# Patient Record
Sex: Male | Born: 1950 | Race: White | Hispanic: No | Marital: Married | Smoking: Never smoker
Health system: Southern US, Community
[De-identification: ages and names within clinical notes are randomized; demographics above are authoritative.]

## PROBLEM LIST (undated history)

## (undated) DIAGNOSIS — I6521 Occlusion and stenosis of right carotid artery: Secondary | ICD-10-CM

## (undated) DIAGNOSIS — I639 Cerebral infarction, unspecified: Secondary | ICD-10-CM

## (undated) DIAGNOSIS — I2510 Atherosclerotic heart disease of native coronary artery without angina pectoris: Secondary | ICD-10-CM

## (undated) DIAGNOSIS — E785 Hyperlipidemia, unspecified: Secondary | ICD-10-CM

## (undated) DIAGNOSIS — I219 Acute myocardial infarction, unspecified: Secondary | ICD-10-CM

## (undated) DIAGNOSIS — N189 Chronic kidney disease, unspecified: Secondary | ICD-10-CM

## (undated) DIAGNOSIS — I6381 Other cerebral infarction due to occlusion or stenosis of small artery: Secondary | ICD-10-CM

## (undated) DIAGNOSIS — G459 Transient cerebral ischemic attack, unspecified: Secondary | ICD-10-CM

## (undated) DIAGNOSIS — I10 Essential (primary) hypertension: Secondary | ICD-10-CM

## (undated) DIAGNOSIS — E119 Type 2 diabetes mellitus without complications: Secondary | ICD-10-CM

## (undated) DIAGNOSIS — K56609 Unspecified intestinal obstruction, unspecified as to partial versus complete obstruction: Secondary | ICD-10-CM

## (undated) HISTORY — DX: Cerebral infarction, unspecified: I63.9

## (undated) HISTORY — DX: Chronic kidney disease, unspecified: N18.9

## (undated) HISTORY — DX: Type 2 diabetes mellitus without complications: E11.9

## (undated) HISTORY — DX: Hyperlipidemia, unspecified: E78.5

## (undated) HISTORY — PX: FINGER SURGERY: SHX640

## (undated) HISTORY — DX: Acute myocardial infarction, unspecified: I21.9

## (undated) HISTORY — PX: OTHER SURGICAL HISTORY: SHX169

---

## 1999-12-10 ENCOUNTER — Emergency Department (HOSPITAL_COMMUNITY): Admission: EM | Admit: 1999-12-10 | Discharge: 1999-12-10 | Payer: Self-pay | Admitting: *Deleted

## 2001-12-14 ENCOUNTER — Encounter: Payer: Self-pay | Admitting: Emergency Medicine

## 2001-12-14 ENCOUNTER — Emergency Department (HOSPITAL_COMMUNITY): Admission: EM | Admit: 2001-12-14 | Discharge: 2001-12-14 | Payer: Self-pay | Admitting: Emergency Medicine

## 2002-02-08 ENCOUNTER — Emergency Department (HOSPITAL_COMMUNITY): Admission: EM | Admit: 2002-02-08 | Discharge: 2002-02-08 | Payer: Self-pay | Admitting: Emergency Medicine

## 2002-08-02 ENCOUNTER — Emergency Department (HOSPITAL_COMMUNITY): Admission: EM | Admit: 2002-08-02 | Discharge: 2002-08-02 | Payer: Self-pay | Admitting: Emergency Medicine

## 2004-06-09 ENCOUNTER — Inpatient Hospital Stay (HOSPITAL_COMMUNITY): Admission: EM | Admit: 2004-06-09 | Discharge: 2004-06-10 | Payer: Self-pay | Admitting: Emergency Medicine

## 2004-11-17 ENCOUNTER — Emergency Department (HOSPITAL_COMMUNITY): Admission: EM | Admit: 2004-11-17 | Discharge: 2004-11-18 | Payer: Self-pay

## 2005-11-22 ENCOUNTER — Encounter: Payer: Self-pay | Admitting: Emergency Medicine

## 2005-11-23 ENCOUNTER — Encounter (INDEPENDENT_AMBULATORY_CARE_PROVIDER_SITE_OTHER): Payer: Self-pay | Admitting: Cardiology

## 2005-11-23 ENCOUNTER — Inpatient Hospital Stay (HOSPITAL_COMMUNITY): Admission: EM | Admit: 2005-11-23 | Discharge: 2005-11-24 | Payer: Self-pay | Admitting: *Deleted

## 2005-11-23 DIAGNOSIS — I639 Cerebral infarction, unspecified: Secondary | ICD-10-CM

## 2005-11-23 HISTORY — DX: Cerebral infarction, unspecified: I63.9

## 2006-02-01 ENCOUNTER — Ambulatory Visit: Payer: Self-pay | Admitting: Family Medicine

## 2006-02-07 ENCOUNTER — Ambulatory Visit: Payer: Self-pay | Admitting: Family Medicine

## 2006-02-07 ENCOUNTER — Ambulatory Visit: Payer: Self-pay | Admitting: *Deleted

## 2006-02-14 ENCOUNTER — Ambulatory Visit: Payer: Self-pay | Admitting: Family Medicine

## 2006-03-05 ENCOUNTER — Ambulatory Visit: Payer: Self-pay | Admitting: Family Medicine

## 2007-09-04 ENCOUNTER — Emergency Department (HOSPITAL_COMMUNITY): Admission: EM | Admit: 2007-09-04 | Discharge: 2007-09-04 | Payer: Self-pay | Admitting: Emergency Medicine

## 2007-10-24 HISTORY — PX: LAPAROSCOPIC CHOLECYSTECTOMY: SUR755

## 2008-03-24 ENCOUNTER — Ambulatory Visit: Payer: Self-pay | Admitting: Vascular Surgery

## 2008-03-24 ENCOUNTER — Encounter (INDEPENDENT_AMBULATORY_CARE_PROVIDER_SITE_OTHER): Payer: Self-pay | Admitting: Internal Medicine

## 2008-03-24 ENCOUNTER — Inpatient Hospital Stay (HOSPITAL_COMMUNITY): Admission: EM | Admit: 2008-03-24 | Discharge: 2008-03-27 | Payer: Self-pay | Admitting: Emergency Medicine

## 2008-03-24 ENCOUNTER — Encounter (INDEPENDENT_AMBULATORY_CARE_PROVIDER_SITE_OTHER): Payer: Self-pay | Admitting: General Surgery

## 2008-10-23 DIAGNOSIS — I639 Cerebral infarction, unspecified: Secondary | ICD-10-CM

## 2008-10-23 HISTORY — DX: Cerebral infarction, unspecified: I63.9

## 2009-06-23 DIAGNOSIS — G459 Transient cerebral ischemic attack, unspecified: Secondary | ICD-10-CM

## 2009-06-23 HISTORY — DX: Transient cerebral ischemic attack, unspecified: G45.9

## 2009-07-16 ENCOUNTER — Ambulatory Visit: Payer: Self-pay | Admitting: Internal Medicine

## 2009-07-16 ENCOUNTER — Inpatient Hospital Stay (HOSPITAL_COMMUNITY): Admission: EM | Admit: 2009-07-16 | Discharge: 2009-07-17 | Payer: Self-pay | Admitting: Emergency Medicine

## 2009-07-16 ENCOUNTER — Encounter (INDEPENDENT_AMBULATORY_CARE_PROVIDER_SITE_OTHER): Payer: Self-pay | Admitting: Neurology

## 2010-01-12 ENCOUNTER — Emergency Department (HOSPITAL_COMMUNITY): Admission: EM | Admit: 2010-01-12 | Discharge: 2010-01-12 | Payer: Self-pay

## 2010-05-02 ENCOUNTER — Emergency Department (HOSPITAL_COMMUNITY): Admission: EM | Admit: 2010-05-02 | Discharge: 2010-05-02 | Payer: Self-pay | Admitting: Emergency Medicine

## 2010-09-22 DIAGNOSIS — K56609 Unspecified intestinal obstruction, unspecified as to partial versus complete obstruction: Secondary | ICD-10-CM

## 2010-09-22 HISTORY — DX: Unspecified intestinal obstruction, unspecified as to partial versus complete obstruction: K56.609

## 2010-09-23 ENCOUNTER — Inpatient Hospital Stay (HOSPITAL_COMMUNITY): Admission: EM | Admit: 2010-09-23 | Discharge: 2010-09-25 | Payer: Self-pay | Admitting: Emergency Medicine

## 2011-01-03 LAB — URINALYSIS, ROUTINE W REFLEX MICROSCOPIC
Bilirubin Urine: NEGATIVE
Ketones, ur: NEGATIVE mg/dL
Nitrite: POSITIVE — AB
Protein, ur: NEGATIVE mg/dL
Urobilinogen, UA: 1 mg/dL (ref 0.0–1.0)

## 2011-01-03 LAB — URINE MICROSCOPIC-ADD ON

## 2011-01-03 LAB — CBC
HCT: 47.4 % (ref 39.0–52.0)
MCHC: 35.9 g/dL (ref 30.0–36.0)
MCV: 90.6 fL (ref 78.0–100.0)
Platelets: 302 10*3/uL (ref 150–400)
RDW: 13 % (ref 11.5–15.5)

## 2011-01-03 LAB — URINE CULTURE
Colony Count: NO GROWTH
Culture  Setup Time: 201112010128
Culture: NO GROWTH

## 2011-01-03 LAB — DIFFERENTIAL
Basophils Absolute: 0 10*3/uL (ref 0.0–0.1)
Basophils Relative: 0 % (ref 0–1)
Eosinophils Absolute: 0.2 10*3/uL (ref 0.0–0.7)
Eosinophils Relative: 2 % (ref 0–5)
Monocytes Absolute: 1.7 10*3/uL — ABNORMAL HIGH (ref 0.1–1.0)

## 2011-01-03 LAB — BASIC METABOLIC PANEL
BUN: 13 mg/dL (ref 6–23)
CO2: 30 mEq/L (ref 19–32)
Calcium: 8.7 mg/dL (ref 8.4–10.5)
Chloride: 103 mEq/L (ref 96–112)
Creatinine, Ser: 0.68 mg/dL (ref 0.4–1.5)
Glucose, Bld: 115 mg/dL — ABNORMAL HIGH (ref 70–99)
Glucose, Bld: 133 mg/dL — ABNORMAL HIGH (ref 70–99)
Potassium: 4.1 mEq/L (ref 3.5–5.1)
Sodium: 140 mEq/L (ref 135–145)

## 2011-01-03 LAB — HEMOGLOBIN A1C: Hgb A1c MFr Bld: 6.2 % — ABNORMAL HIGH (ref <5.7)

## 2011-01-08 LAB — BASIC METABOLIC PANEL
BUN: 8 mg/dL (ref 6–23)
Chloride: 103 mEq/L (ref 96–112)
GFR calc non Af Amer: >60 mL/min (ref >60)
Glucose, Bld: 133 mg/dL — ABNORMAL HIGH (ref 70–99)
Potassium: 3.4 mEq/L — ABNORMAL LOW (ref 3.5–5.1)

## 2011-01-08 LAB — BASIC METABOLIC PANEL WITH GFR
CO2: 27 meq/L (ref 19–32)
Calcium: 8.7 mg/dL (ref 8.4–10.5)
Creatinine, Ser: 0.74 mg/dL (ref 0.4–1.5)
GFR calc Af Amer: >60 mL/min (ref >60)
Sodium: 137 meq/L (ref 135–145)

## 2011-01-08 LAB — DIFFERENTIAL
Basophils Absolute: 0 10*3/uL (ref 0.0–0.1)
Basophils Relative: 0 % (ref 0–1)
Eosinophils Absolute: 0 10*3/uL (ref 0.0–0.7)
Eosinophils Relative: 0 % (ref 0–5)
Lymphocytes Relative: 14 % (ref 12–46)
Lymphs Abs: 1.3 K/uL (ref 0.7–4.0)
Monocytes Absolute: 1.2 10*3/uL — ABNORMAL HIGH (ref 0.1–1.0)
Monocytes Relative: 13 % — ABNORMAL HIGH (ref 3–12)
Neutro Abs: 6.9 K/uL (ref 1.7–7.7)
Neutrophils Relative %: 72 % (ref 43–77)

## 2011-01-08 LAB — CBC
HCT: 46.1 % (ref 39.0–52.0)
Hemoglobin: 15.8 g/dL (ref 13.0–17.0)
MCH: 32.2 pg (ref 26.0–34.0)
MCHC: 34.3 g/dL (ref 30.0–36.0)
MCV: 94 fL (ref 78.0–100.0)
Platelets: 257 K/uL (ref 150–400)
RBC: 4.91 MIL/uL (ref 4.22–5.81)
RDW: 12.7 % (ref 11.5–15.5)
WBC: 9.5 K/uL (ref 4.0–10.5)

## 2011-01-16 LAB — CBC
Hemoglobin: 15.1 g/dL (ref 13.0–17.0)
MCHC: 35 g/dL (ref 30.0–36.0)
MCV: 94.3 fL (ref 78.0–100.0)
RBC: 4.57 MIL/uL (ref 4.22–5.81)

## 2011-01-16 LAB — POCT I-STAT, CHEM 8
BUN: 8 mg/dL (ref 6–23)
Chloride: 105 meq/L (ref 96–112)
HCT: 44 % (ref 39.0–52.0)
Potassium: 3.5 meq/L (ref 3.5–5.1)
Sodium: 140 meq/L (ref 135–145)

## 2011-01-16 LAB — DIFFERENTIAL
Basophils Relative: 0 % (ref 0–1)
Eosinophils Absolute: 0.1 10*3/uL (ref 0.0–0.7)
Monocytes Absolute: 0.7 10*3/uL (ref 0.1–1.0)
Monocytes Relative: 12 % (ref 3–12)

## 2011-01-16 LAB — LACTIC ACID, PLASMA: Lactic Acid, Venous: 1.8 mmol/L (ref 0.5–2.2)

## 2011-01-27 LAB — COMPREHENSIVE METABOLIC PANEL
AST: 33 U/L (ref 0–37)
Albumin: 4 g/dL (ref 3.5–5.2)
Alkaline Phosphatase: 92 U/L (ref 39–117)
BUN: 9 mg/dL (ref 6–23)
Chloride: 105 mEq/L (ref 96–112)
Creatinine, Ser: 0.63 mg/dL (ref 0.4–1.5)
GFR calc Af Amer: >60 mL/min (ref >60)
Potassium: 3.6 mEq/L (ref 3.5–5.1)
Total Bilirubin: 1 mg/dL (ref 0.3–1.2)
Total Protein: 7.5 g/dL (ref 6.0–8.3)

## 2011-01-27 LAB — URINALYSIS, ROUTINE W REFLEX MICROSCOPIC
Ketones, ur: NEGATIVE mg/dL
Nitrite: NEGATIVE
Protein, ur: NEGATIVE mg/dL
Urobilinogen, UA: 4 mg/dL — ABNORMAL HIGH (ref 0.0–1.0)

## 2011-01-27 LAB — DRUGS OF ABUSE SCREEN W/O ALC, ROUTINE URINE
Amphetamine Screen, Ur: NEGATIVE
Barbiturate Quant, Ur: NEGATIVE
Benzodiazepines.: NEGATIVE
Methadone: NEGATIVE
Phencyclidine (PCP): NEGATIVE

## 2011-01-27 LAB — CBC
HCT: 41.7 % (ref 39.0–52.0)
Hemoglobin: 14.9 g/dL (ref 13.0–17.0)
RDW: 13 % (ref 11.5–15.5)
WBC: 5.6 10*3/uL (ref 4.0–10.5)

## 2011-01-27 LAB — GLUCOSE, CAPILLARY
Glucose-Capillary: 120 mg/dL — ABNORMAL HIGH (ref 70–99)
Glucose-Capillary: 137 mg/dL — ABNORMAL HIGH (ref 70–99)
Glucose-Capillary: 147 mg/dL — ABNORMAL HIGH (ref 70–99)

## 2011-01-27 LAB — DIFFERENTIAL
Eosinophils Relative: 1 % (ref 0–5)
Lymphocytes Relative: 28 % (ref 12–46)
Monocytes Absolute: 0.7 10*3/uL (ref 0.1–1.0)
Monocytes Relative: 12 % (ref 3–12)
Neutro Abs: 3.3 10*3/uL (ref 1.7–7.7)

## 2011-01-27 LAB — LIPID PANEL
HDL: 18 mg/dL — ABNORMAL LOW (ref >39)
Total CHOL/HDL Ratio: 7.2 RATIO
Triglycerides: 176 mg/dL — ABNORMAL HIGH (ref <150)

## 2011-01-27 LAB — TROPONIN I: Troponin I: 0.03 ng/mL (ref 0.00–0.06)

## 2011-01-27 LAB — HEMOGLOBIN A1C: Hgb A1c MFr Bld: 5.6 % (ref 4.6–6.1)

## 2011-01-27 LAB — APTT: aPTT: 28 seconds (ref 24–37)

## 2011-01-27 LAB — CK TOTAL AND CKMB (NOT AT ARMC)
CK, MB: 1.1 ng/mL (ref 0.3–4.0)
Total CK: 53 U/L (ref 7–232)

## 2011-01-27 LAB — URINE MICROSCOPIC-ADD ON

## 2011-03-07 NOTE — Consult Note (Signed)
NAMEVICTORIA, EUCEDA                  ACCOUNT NO.:  [id number]   MEDICAL RECORD NO.:  [id number]          PATIENT TYPE:  INP   LOCATION:  5120                         FACILITY:  [facility]   PHYSICIAN:  Isidor Holts, M.D.  DATE OF BIRTH:  1951/07/13   DATE OF CONSULTATION:  03/24/2008  DATE OF DISCHARGE:                                 CONSULTATION   REFERRING PHYSICIAN:  Dr. Derrell Lolling.   REASON FOR CONSULTATION:  Preoperative evaluation.   HISTORY OF PRESENT ILLNESS:  This is a 60 year old male, admitted March 24, 2008, with acute calculous cholecystitis.  Surgery is planned for  today, and we are requested to provide preoperative evaluation and  clearance.   PAST MEDICAL HISTORY:  1. 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  2. PVD.  Right 40%-50% ICA stenosis.  3. Previous history of transient hypertension.  4. Coronary artery disease, status post MI approximately 15 years ago.   MEDICATIONS PREADMISSION:  Aspirin 81 mg p.o. daily.   ALLERGIES:  PENICILLIN.   REVIEW OF SYSTEMS:  Right upper quadrant pain, fever, denies vomiting or  diarrhea.  Denies chest pain or shortness of breath.  Effort tolerance  is of the order of 6 miles on the flat, according to patient.   SOCIAL HISTORY:  Retired.  Works as a Pensions consultant.  Nonsmoker,  nondrinker.  Has no history of drug abuse.   FAMILY HISTORY:  Noncontributory.   PHYSICAL EXAMINATION:  VITALS:  Temperature 97.7, pulse 59 per minute  regular, respiratory rate 16, BP 97/58 mmHg, pulse oximeter 97% on room  air.  GENERAL:  The patient did not appear to be in obvious acute distress at  time of this evaluation, alert, communicative, not short of breath at  rest.  HEENT:  No clinical pallor or jaundice.  No conjunctival injection.  Throat is clear.  NECK:  Supple.  JVP not seen.  No palpable lymphadenopathy.  No palpable  goiter.  No carotid bruits.  CHEST:  Clinically clear to  auscultation.  No wheezes, no crackles.  HEART:  Heart sounds 1 and 2 are heard, normal, regular, no murmurs.  ABDOMEN:  Full, soft.  The patient has right upper quadrant positive  Murphy's sign.  Bowel sounds are heard.  EXTREMITIES:  Lower extremity examination:  No pitting edema.  Palpable  peripheral pulses.  MUSCULOSKELETAL:  Unremarkable.  CENTRAL NERVOUS SYSTEM:  No focal neurologic deficit on gross  examination.   LABORATORY INVESTIGATIONS:  Investigations March 23, 2008.  CBC:  WBC  13.7, hemoglobin 15.3, hematocrit 45, platelets 237.  Electrolytes:  Sodium 139, potassium 3.7, chloride 103, CO2 24, BUN 9, creatinine 0.7,  glucose 102.  Troponin I point of care 0.05.  LFT's normal.  Urinalysis  negative.  INR March 24, 2008 1.2.   Head CT scan March 23, 2008 shows old right basal ganglia CVA, no acute  intracranial findings.  Abdominal CT scan/pelvic CT scan March 24, 2008  shows cholelithiasis.  Ultrasound scan abdomen March 23, 2000 shows large  gallstone in bladder neck  and positive radiographic Murphy's.  Chest x-  ray on March 24, 2008 shows mild cardiac enlargement; otherwise, no acute  findings.  A 12-lead EKG dated March 23, 2008 shows sinus rhythm regular  110 per minute, normal axis, pressure right bundle branch block pattern,  no acute ischemic changes.   ASSESSMENT AND PLAN/RECOMMENDATIONS:  1. Acute calculous cholecystitis.  We shall defer management to the      surgical team.  Surgery is contemplated for March 23, 2008.   1. History of coronary artery disease.  The patient is completely      asymptomatic and has effort tolerance of the order of approximately      six miles on the flat.  Denies shortness of breath or chest pain,      and is currently only on Aspirin.  12-lead EKG is unremarkable.  We      recommend perioperative low dose beta blockade.   1. Peripheral vascular disease/history of right carotid artery      stenosis.  The patient's peripheral pulses are  palpable.  Review of      electronic medical records, indicates a previous finding of right      40-50% carotid artery stenosis in 2007.  I doubt this has      progressed dramatically since then.  However, for completeness, we      shall recommend doing bilateral carotid/vertebral artery duplex      scan to rule out possible high grade stenosis.   COMMENT:  The patient has very remote history of coronary artery  disease, status post MI approximately 15 years ago.  He has had no  symptoms since.  He is also an arteriopath, but he has no valvular heart  disease.  At this point, based on multifactorial indices, we will  consider him at low to moderate risk for general anesthesia and surgical  procedure.  As mentioned above, we recommend carotid/vertebral artery  duplex to rule out high grade stenosis prior to surgery and also  perioperative low dose beta blocker.   Thanks for the consultation.  We will follow with you.      Isidor Holts, M.D.  Electronically Signed     CO/MEDQ  D:  03/24/2008  T:  03/24/2008  Job:  [id number]   cc:   Angelia Mould. Derrell Lolling, M.D.

## 2011-03-07 NOTE — Op Note (Signed)
[name], [name] NO.:  [id number]   MEDICAL RECORD NO.:  [id number]           PATIENT TYPE:   LOCATION:                                 FACILITY:   PHYSICIAN:  [name], M.D.DATE OF BIRTH:  1951-05-07   DATE OF PROCEDURE:  03/24/2008  DATE OF DISCHARGE:                               OPERATIVE REPORT   PREOPERATIVE DIAGNOSIS:  Acute cholecystitis with cholelithiasis.   POSTOPERATIVE DIAGNOSIS:  Acute cholecystitis with cholelithiasis.   OPERATION PERFORMED:  Laparoscopic cholecystectomy with intraoperative  cholangiogram.   SURGEON:  Duane Mould. Derrell Lolling, MD   FIRST ASSISTANT:  [name], [place]   OPERATIVE INDICATIONS:  This is a 60 year old man who noted the onset of  sharp right upper quadrant and right flank pain at 5:00 p.m. on March 23, 2008, and the pain persisted.  He came to the emergency room.  He has a  history of a stroke and CT scan of brain showed an old infarct, but no  acute changes.  CT scan of the abdomen and pelvis showed gallstones.  Ultrasound showed gallstone in the neck of the gallbladder and slight  wall thickening.  The patient had fever to 102.5 and a white blood cell  count of 13,700, but minimally abnormal liver function test.  He was  admitted by Dr. Avel [name].  He was placed on IV antibiotics.  This  morning, I saw the patient.  We had Dr. Brien [name] see him for medical  clearance.  Carotid Doppler was done, which showed no evidence of any  significant carotid stenosis and so we decided to bring the patient to  the operating room today.   OPERATIVE FINDINGS:  The gallbladder was edematous and acutely inflamed.  The cystic duct was extremely tiny, but was patent.  The cholangiogram  was normal showing normal intrahepatic and extrahepatic bile ducts, no  filling defects, no stones, and no obstruction with good flow of  contrast to the duodenum.  The liver, stomach, duodenum, small  intestine, large intestine, and  peritoneal surfaces were otherwise  normal to inspection.   OPERATIVE TECHNIQUE:  Following the induction of general endotracheal  anesthesia, the patient's abdomen was prepped and draped in a sterile  fashion.  Intravenous antibiotics had previously been given.  The  patient was identified as correct patient and correct procedure.  A 0.5%  Marcaine with epinephrine was used as local infiltration anesthetic.  A  curved transverse incision was made at the superior rim of the  umbilicus.  The fascia was incised in the midline and the abdominal  cavity was entered under direct vision.  A 12-mm Hasson trocar was  inserted and secured with purse-string suture of 0 Vicryl.  Pneumoperitoneum was created.  Video camera was inserted with  visualization and findings as described above.  A 12-mm trocar was  placed in the subxiphoid region and two 5 mm-trocars were placed in the  right midabdomen.  The gallbladder fundus was identified and grasped and  elevated.  We took some adhesions down from the lower body and  infundibulum of the gallbladder and then we were able to retract the  infundibulum laterally.  I dissected out the cystic duct and the cystic  artery.  This cystic artery was secured with multiple metal clips and  divided creating a large window behind the cystic duct.  A cholangiogram  catheter was inserted into the cystic duct.  A cholangiogram was  obtained using the C-arm.  The cholangiogram was normal as described  above.  The cystic duct tore in half during all of this process.  Under  direct vision, we took the cholangiogram catheter out, but the cystic  duct was very tiny and slipped away.  With retraction, we were able to  identify and grasp it and put 3 metal clips on it and there was no  evidence of any bile leak that I could see.  The gallbladder was  dissected from its bed with electrocautery, placed in a specimen bag and  removed.  The operative field was copiously irrigated  with saline.  At  the completion of the case, the irrigation fluid looked fairly clear.  There was no obvious bleeding or bile leak.  Because of the acute  inflammation and somewhat inflamed and necrotic appearance of the cystic  duct, I chose to put a 19-French Blake drain in the subphrenic space.  This was brought out through one of the lateral 5-mm port sites.  I  sutured the skin with nylon suture and connected to a suction bulb.  We  looked around and saw no other abnormality.  All the pneumoperitoneum  was released and all the trocars were removed.  The fascia of the  umbilicus was closed with interrupted sutures of 0 Vicryl.  The skin  incisions were closed with subcuticular sutures of 4-0 Monocryl and  Dermabond.  Clean bandages were placed and the patient was taken to the  recovery room in stable condition.   ESTIMATED BLOOD LOSS:  About 20-25 mL.   COMPLICATIONS:  None.   SPONGE, NEEDLE, AND COUNTS:  Correct.      [name], M.D.  Electronically Signed     HMI/MEDQ  D:  03/24/2008  T:  03/25/2008  Job:  [id number]   cc:   [name], M.D.

## 2011-03-07 NOTE — Discharge Summary (Signed)
[name], [name] NO.:  [id number]   MEDICAL RECORD NO.:  [id number]          PATIENT TYPE:  INP   LOCATION:  5159                         FACILITY:  [facility]   PHYSICIAN:  Angelia Mould. Derrell Lolling, M.D.DATE OF BIRTH:  May 01, 1951   DATE OF ADMISSION:  03/23/2008  DATE OF DISCHARGE:  03/27/2008                               DISCHARGE SUMMARY   ADMITTING PHYSICIAN:  Adolph Pollack, MD   DISCHARGING PHYSICIAN:  Angelia Mould. Derrell Lolling, MD   OPERATING SURGEON:  Angelia Mould. Derrell Lolling, MD   PROCEDURE:  A laparoscopic cholecystectomy with intraoperative  cholangiogram on March 24, 2008.   CONSULTATIONS:  Isidor Holts, MD   REASON FOR HOSPITALIZATION OR REASON FOR ADMISSION:  [name] is a 60-  year-old male who had an abrupt onset of sharp right flank pain that was  persistent.  He then presented to the emergency department, where a CT  scan was performed looking for a kidney stone.  CT scan did not show  kidney stone, but did show some gallstones.  At this time, he was noted  to have leukocytosis and a mild elevation in his total bilirubin.  An  abdominal ultrasound was performed at this time, which demonstrated 2-cm  gallstone with gallbladder wall thickening consistent with acute  cholecystitis.  He denied any fever, vomiting, diarrhea, or  constipation.  Please see admitting history and physical for further  details.   ADMITTING DIAGNOSES:  1. Acute cholecystitis.  2. History of cerebrovascular accident.  3. Transient hypertension.  4. History of myocardial infarction 15 years ago.  5. Right carotid artery disease.   HOSPITAL COURSE:  The patient was admitted to [facility].  At this time, based on the patient's history, CVA as well as  MI, we had the Internal Medicine doctors consulted for preoperative  evaluation.  At this time, Dr. Brien Few did examine the patient and after his  examination recommended that the patient have bilateral carotid  artery  duplex scan to rule out high-grade stenosis.  He also recommended at  this time that the patient be put on a preoperative low-dose beta-  blocker.  Lateral that day, the patient did have a carotid duplex scan,  which did not show any significant stenosis.  Therefore, at this time,  the patient was taken to the operating room, where a laparoscopic  cholecystectomy with intraoperative cholangiogram was performed.  During  the surgery, the patient had some bleeding, and therefore a 19-French  Blake drain was put in place.  On postoperative day one half, which was  later on March 24, 2008, the patient was complaining of bilateral arm  heaviness.  The patient states at this time he feels similar to his  prior CVA symptoms.  At this time, the patient is not having any chest  pain.   On exam, the patient was alert and oriented x3.  His speech was clear  and had essentially normal neurovascular exam except for some residual  left-sided weakness from his prior CVA.  At this time, the patient was  transferred to  a step-down unit for closer monitoring.  On postoperative  day #1, the patient was continuing to improve and felt better.  At this  time, he was denying any weakness or heaviness.  His abdomen was soft  and tender in the right upper quadrant.  Of note, he had some  serosanguineous drainage out of his JP bulb.  At this time, the patient  seemed stable, and he was transferred back to a regular floor bed.   On postoperative day #2, the patient was once again complaining of some  heaviness this time in his legs as well as his arm.  However, at this  time, he stated that he had just received a dose of Phenergan, which was  making him somewhat lethargic as well as weak.  At this time, Dr.  Toniann Fail was called.  I discussed this case with him and at this time,  we felt the need to get a stat MRI of the head as well as some stat  labs.  On exam, his cranial nerves II through XII were  grossly intact.  He had poor strength bilaterally in his lower extremities as well as his  upper extremities.  Otherwise, his exam was difficult to perform due to  lethargy.  At this time, due to the possibility that this was medication  induced with discontinued his Phenergan and changed it to Zofran.  Later  that day, his MRI came back and showed no abnormalities.  His CBC as  well as his CMET, amylase, and lipase all came back within normal limits  as well.  By postoperative day #3, the patient was feeling much better  and states that he had not had any more episodes of heaviness or  weakness.  At this time, he was also tolerating a regular diet with no  nausea and vomiting.  On exam, his abdomen was soft and he was still  somewhat tender in the right upper quadrant.  His JP drain has  serosanguineous output and it was discontinued at this time prior to  discharge.  Otherwise, the patient was continuing to do well and was  felt ready for discharge.   DISCHARGE DIAGNOSES:  1. Acute cholecystitis with cholelithiasis.  2. Status post laparoscopic cholecystectomy.  3. History of cerebrovascular accident.  4. 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  5. Transient hypertension.  6. History of myocardial infarction.  7. History of right carotid artery disease.   DISCHARGE MEDICATIONS:  The patient is told that he may continue his  aspirin 81 mg daily.  He is also given prescription for metoprolol 12.5  mg 1 tablet b.i.d.  He is also given a prescription for Vicodin 5/325 mg  1-2 p.o. q.4 h. p.r.n. pain.   DISCHARGE INSTRUCTIONS:  The patient was informed that he may return to  work in 1-2 weeks.  He had no dietary restrictions.  He was told that he  may increase his activity slowly and he may walk up steps.  He was also  informed that he may shower.  However, he is not to lift anything  greater than 15 pounds for the next 2 weeks.  He is also informed that  if he begins to get a fever  greater than 101.5, new or severe abdominal  pain, or redness or pus-  like drainage from his incision, to please call our office.  Otherwise,  he is informed that he needs to follow up in our clinic on April 14, 2008  at 2 p.m.  He is also informed that if he is not able to keep this  appointment, to call and cancel it, and reschedule his appointment with  Dr. Derrell Lolling.      Letha [place], [place]      Angelia Mould. Derrell Lolling, M.D.  Electronically Signed    KEO/MEDQ  D:  03/27/2008  T:  03/27/2008  Job:  [id number]   cc:   Eduard Clos, MD  Isidor Holts, M.D.

## 2011-03-07 NOTE — H&P (Signed)
[name], [name] NO.:  [id number]   MEDICAL RECORD NO.:  [id number]          PATIENT TYPE:  INP   LOCATION:  5120                         FACILITY:  [facility]   PHYSICIAN:  Adolph Pollack, M.D.DATE OF BIRTH:  1950/12/24   DATE OF ADMISSION:  03/23/2008  DATE OF DISCHARGE:                              HISTORY & PHYSICAL   CHIEF COMPLAINT:  Right flank pain.   HISTORY OF PRESENT ILLNESS:  This is a 60 year old male in his normal  state of health when at 5:00 p.m. yesterday had abrupt onset of sharp  right flank pain that persisted would not let up.  He subsequently  presented to the emergency room and a CT scan was performed looking for  a kidney stone.  Urinalysis did not show any blood.  CT scan did not  show a kidney stone, but did show some gallstones.  He was known to have  a leukocytosis and mild elevation of total bilirubin.  An abdominal  ultrasound was performed, which demonstrated a 2-cm gallstone with  gallbladder wall thickening consistent with acute cholecystitis.  He has  not had any fever, vomiting, diarrhea, or constipation.  I subsequently  was asked to see him.   PAST MEDICAL HISTORY:  1. Cerebrovascular accident.  2. Transient hypertension.  3. Myocardial infarction 15 years ago per patient.  4. Right carotid artery disease.   PREVIOUS OPERATIONS:  None.   ALLERGIES:  PENICILLIN.   MEDICATIONS:  Aspirin one a day.   SOCIAL HISTORY:  He is married and works 3 hours a day.  No tobacco or  alcohol use.  He has no primary care physician.   FAMILY HISTORY:  Notable for mother who died of lung cancer.   REVIEW OF SYSTEMS:  CARDIOVASCULAR:  He says he has no chest pain and  his high blood pressure is no longer an issue.  PULMONARY:  Denies  pneumonia or asthma.  GI:  No peptic ulcer disease.  No hepatitis or  colitis.  GU:  No kidney stones.  ENDOCRINE:  No diabetes or  hypercholesterolemia.  NEUROLOGIC:  Still has a little bit of  residual  left lower extremity weakness after the stroke.  Does have occasional  headaches.  These are treated with Tylenol.  HEMATOLOGIC:  He states  sometimes if he cuts himself, he bleeds he thinks quite a bit.  No blood  clots.   PHYSICAL EXAM:  GENERAL:  Well-developed, well-nourished male appears to  be in no acute distress, pleasant, cooperative.  VITAL SIGNS:  Maximum temperature in the emergency department 102.5, his  last temperature was 99.3, blood pressure is 107/60, pulse 88, and  respiratory rate 18.  EYES:  Extraocular motions intact.  No icterus.  NECK:  Supple without masses.  RESPIRATORY:  Breath sounds equal and clear.  Respirations unlabored.  CARDIOVASCULAR:  Regular rate, regular rhythm.  I do not hear a murmur.  ABDOMEN:  Soft, but there is right upper quadrant tenderness and  guarding with a positive Murphy sign.  No obvious mass.  No other  abdominal tenderness.  MUSCULOSKELETAL:  Has got good muscle tone, range of motion.  SKIN:  No jaundice.   LABORATORY DATA:  White count is 13,700, hemoglobin 15.1.  Total  bilirubin is elevated at 1.4, but no elevation of other liver function  tests.  Urinalysis normal.  Electrolytes within normal limits except for  glucose of 102.   CT scan and ultrasound reviewed.   IMPRESSION:  Acute cholecystitis.   PLAN:  Admit to the hospital, start IV antibiotics and IV fluids.  Cholecystectomy.  I went over the procedure and risks of laparoscopic  possible open cholecystectomy.  Risks include, but are not limited to  bleeding, infection, wound healing problems, anesthesia, bile leak,  diarrhea, accidental injury to common bile duct/liver/intestine.  He  seems to understand all of these and is agreeable with the plan.      Adolph Pollack, M.D.  Electronically Signed     TJR/MEDQ  D:  03/24/2008  T:  03/24/2008  Job:  [id number]

## 2011-03-10 NOTE — H&P (Signed)
NAME:  TONI, HOFFMEISTER                            ACCOUNT NO.:  [id number]   MEDICAL RECORD NO.:  [id number]                   PATIENT TYPE:  EMS   LOCATION:  ED                                   FACILITY:  [facility]   PHYSICIAN:  Currie Paris, M.D.           DATE OF BIRTH:  [birth year]   DATE OF ADMISSION:  06/09/2004  DATE OF DISCHARGE:                                HISTORY & PHYSICAL   CHIEF COMPLAINT:  Feel, hurt ribs.   HISTORY OF PRESENT ILLNESS:  [name] was working on a ladder and fell.  He said he was on a 16 foot ladder, fell he thinks about 14 feet.  He landed  on his right side, and complains of right clavicle, right shoulder, right  arm, and right rib pain.  The arm pain is in the lower part of the upper  arm, just above the elbow.  These are his only injuries.  He denies other  extremity injury, abdominal problems or injury, or lower extremity injury.  He has had no problems with breathing.  It does hurt to take a deep breath,  but he has not been short of breath, etc.   PAST MEDICAL HISTORY:   OPERATIONS:  None.   MEDICATIONS:  He takes [place] powders frequently for headaches.   ALLERGIES:  PENICILLIN causes him to swell.   SOCIAL HISTORY:  He does not smoke.  He does not drink alcohol.  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   FAMILY HISTORY:  Unremarkable for any significant illnesses.   REVIEW OF SYSTEMS:  HEENT is negative.  CHEST:  No history prior to current  illness of dyspnea on exertion, chest pain, etc.  HEART:  No history of  hypertension, murmurs, or cardiac disease.  ABDOMEN:  Negative.  No history  of abdominal complaints, nausea, vomiting, changes in bowel habits, etc.  GU:  No problems with voiding.  No history of urinary tract or prostate  problems.  EXTREMITIES:  Other than the current illness, negative.   PHYSICAL EXAMINATION:  GENERAL:  The patient is alert, reasonably  comfortable, having already received pain  medication.  He is oriented x3.  VITAL SIGNS:  Blood pressure of 112/79, pulse of about 80, respirations 16,  temperature 96.8.  He weighs 165.  HEENT:  Head is normocephalic.  Eyes are nonicteric.  Pupils are equal,  round, and regular.  EOM's are intact.  Pharynx is normal.  Dentition okay.  NECK:  Supple.  There are no masses, and no thyromegaly noted.  LUNGS:  He has shallow respirations, but no particularly rapid.  He has some  decreased breath sounds on the right side, and a little apparent splinting.  The left side is fairly clear.  CHEST:  He is tender along the left rib cage and the left clavicle, and  there does appear to be some  deformity of the left clavicle.  HEART:  Regular rhythm.  I hear no murmurs, rubs, or gallops.  He has intact  pulses, carotid, femoral, dorsalis pedis.  ABDOMEN:  Soft, flat, nontender.  There are no masses, no organomegaly, and  no hernias are noted.  GU:  Unremarkable.  EXTREMITIES:  No cyanosis or edema.  Good range of motion, except for  problems with his right shoulder secondary to his trauma.   LABORATORY DATA:  None currently available.   X-RAYS:  A CT and plain films show fracture of his right clavicle and  multiple right rib fractures.  No pneumothorax.  No evidence of intra-  abdominal injuries.   IMPRESSION:  Traumatic trauma from fall with right clavicle and multiple rib  fractures.   PLAN:  Will admit him for pain control, keep him on some oxygen, PAS hose,  regular diet, and recheck x-rays in the morning.  He does have labs pending,  which could mean we need to make some adjustments once those are available.  We will attempt to transfer him when a bed is available to the trauma  service at [facility].                                               Currie Paris, M.D.    CJS/MEDQ  D:  06/09/2004  T:  06/10/2004  Job:  [id number]

## 2011-03-10 NOTE — Discharge Summary (Signed)
NAME:  [name], [name]                  ACCOUNT NO.:  [id number]   MEDICAL RECORD NO.:  [id number]          PATIENT TYPE:  INP   LOCATION:  3703                         FACILITY:  [facility]   PHYSICIAN:  Pramod P. Pearlean Brownie, MD    DATE OF BIRTH:  21-Jul-1951   DATE OF ADMISSION:  11/23/2005  DATE OF DISCHARGE:  11/24/2005                                 DISCHARGE SUMMARY   DIAGNOSES AT TIME OF DISCHARGE:  1.  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  2.  Headache, chronic.  3.  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  4.  Hypertension, resolved.  5.  Unknown heart problems in the past.   MEDICINES AT TIME OF DISCHARGE:  1.  Aspirin 325 mg a day.  2.  Multivitamin a day.  3.  Tylenol two q.6h. p.r.n. headache.   STUDIES PERFORMED:  1.  CT of the brain on admission shows a probable small lacunar infarct in      the right internal capsule and caudate head.  No acute findings.  2.  MRI of the brain - no acute abnormalities.  Old lacune infarct in the      right caudate head and the right internal capsule.  Nonspecific      subcortical hyperintensities likely secondary to small vessel disease,      most likely demyelinating process or vasculitis.  Mild to moderate      sinusitis with changes in the ethmoid and frontal sinuses.  Probable      right nasal polyp.  3.  MRA of the brain showed focal hyperintensity in the ACOM region, cannot      exclude an aneurysm though may be just a vessel loop.  Possible      infundibulum at the origin of the left PCOM.  4.  Chest x-ray shows moderate interstitial edema.  5.  EKG shows normal sinus rhythm.  6.  Carotid Doppler shows right 40-50% ICA stenosis.  7.  Echocardiogram shows EF of 55% with no embolic source.  8.  Transcranial Doppler were performed, results pending.   LABORATORY STUDIES:  Coagulation studies normal.  Hemoglobin A1c 5.1.  Homocystine 15.6, cholesterol 150, triglycerides 289, HDL 22  and LDL 70.  Urine drug screen negative.  Alcohol level 6.  Urinalysis normal.   HISTORY OF PRESENT ILLNESS:  Mr. [name] is a 60 year old right-handed  white male with a one-day history of left hand tingling and numbness and a  headache.  The patient states his headache started the day prior to  admission when he developed acute onset of bifrontal headache and left-sided  face, upper extremity and lower extremity tingling and numbness which lasted  about 15 minutes then resolved.  He had similar symptoms upon awakening the  day of admission and his left arm felt heavy and weak.  The symptoms did not  resolve so then he presented to the emergency room.  He was admitted for  further stroke evaluation.  He was not a tPA candidate secondary to time of  onset.   HOSPITAL COURSE:  CT revealed what looked like an old right caudate infarct.  MRI confirmed this was an old infarct with no acute abnormalities.  The  patient was found to have vascular risk factors of right 40-60% ICA  stenosis, elevated triglycerides and slightly elevated homocystine.  The  patient was placed on multivitamin to lower the homocystine and will follow  up triglycerides as well as carotid stenosis.  Plans are to discharge the  patient on aspirin for secondary stroke prevention.  Have recommended a drug  holiday to improve headaches as it may be NSAID rebound headache.  The  patient does not have a primary care physician and was suggested to get one.  He will follow up in 58 month.   CONDITION ON DISCHARGE:  Nonfocal neurologic exam.   DISCHARGE PLAN:  1.  Discharge home with family.  2.  Aspirin 325 mg a day for secondary stroke prevention.  3.  Stop all NSAIDs.  May use Tylenol for headache.  4.  Obtain a primary MD and see within 58 month.  5.  Add multivitamin for slightly elevated homocystine.  6.  Follow up ICA stenosis.  7.  Follow up triglycerides as an outpatient.  8.  Follow up with Annie Main,  N.P. in 58 month.      Annie Main, N.P.    ______________________________  Sunny Schlein. Pearlean Brownie, MD    SB/MEDQ  D:  11/24/2005  T:  11/24/2005  Job:  [id number]

## 2011-03-10 NOTE — H&P (Signed)
NAMEGUAGE, EFFERSON NO.:  [id number]   MEDICAL RECORD NO.:  [id number]          PATIENT TYPE:  EMS   LOCATION:  ED                           FACILITY:  [facility]   PHYSICIAN:  Bevelyn Buckles. Champey, M.D.DATE OF BIRTH:  1951/04/01   DATE OF ADMISSION:  11/22/2005  DATE OF DISCHARGE:                                HISTORY & PHYSICAL   REFERRING PHYSICIAN:  Dr. Adriana Simas.   HISTORY OF PRESENT ILLNESS:  Mr. [name] is a 60 year old Caucasian male who  presents after one-day history of left-sided tingling and numbness in the  face, arm and headache. The patient states the symptoms started yesterday  morning when he developed acute onset of bilateral frontal headache and left-  sided face, arm and leg tingling and numbness which lasted about 15 minutes  and resolved. The patient went to sleep last evening and woke up this  morning with similar symptoms with left-sided numbness and tingling but also  felt his left leg was heavy and weak. Today, the symptoms did not resolve  and the patient still has numbness on his left side. The patient states that  he feels like his left leg is strong and back to normal. He denies any prior  events and he has not seen a physician over two years. He denies any  symptoms of vision changes, speech changes, swallowing problems, chewing  gum, dizziness, vertigo, falls, balance problems, or loss of consciousness.   PAST MEDICAL HISTORY:  The patient has questionable heart problems in the  past; however, he is unsure of what they are.   CURRENT MEDICATIONS:  Only include Goody powders p.r.n. for headache.   ALLERGIES:  PENICILLIN.   FAMILY HISTORY:  Positive for CVA and heart disease.   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 Denies any alcohol or tobacco use.   REVIEW OF SYSTEMS:  Positive as per HPI and for a right shoulder pain  secondary to old injury.   REVIEW OF SYSTEMS:  Negative as per HPI on  greater than eight other organ  systems.   PHYSICAL EXAMINATION:  VITAL SIGNS:  Temperature 96.8, blood pressure 147 to  168/97 to 107, pulse is 65, respirations 16 to 20, O2 saturation is 98%.  HEENT:  Normocephalic and atraumatic. Extraocular movements intact. Pupils  are equal, round, and reactive to light.  NECK:  Supple. No carotid bruits.  HEART:  Regular.  LUNGS:  Clear.  ABDOMEN:  Soft and nontender.  EXTREMITIES:  No edema. Good pulses.  NEUROLOGICAL:  The patient is alert and oriented x3. Language is fluent.  Memory and knowledge are within normal limits. Cranial nerves II through XII  were grossly intact except for a decrease sensation in the left face and  lower two-thirds. Motor examination shows 5/5 strength and normal tone in  all four extremities. No drift is noted. Sensory examination has decreased  sensation, tone modalities in face, upper extremity and lower extremity.  Reflexes are 1 to 2+ throughout and symmetric. Toes are downgoing  bilaterally. Cerebellar function is within  normal limits, finger-to-nose and  heel-to-shin. Gait is not assessed secondary to safety.   LABORATORY DATA:  WBC 6.8, hemoglobin 14.7, hematocrit is 41.8, platelets  329,000. Sodium is 139, potassium 4.0, chloride is 106, CO2 is 29, BUN 6,  creatinine 0.7, glucose 80, calcium is 8.8. LFT's were within normal limits.  CT of the head shows a right clotted internal capsule lacune which is age  indeterminate.   IMPRESSION:  This is a 60 year old Caucasian male with a one-day history of  left-sided tingling and numbness with lacune noted on CT to correlate with  his symptoms and can be seen on CT 24 hours after stroke occurred. I believe  that the patient did have a right subcortical infarct given his symptoms and  history. We will transfer the patient to [facility] and admit him  to the stroke M.D. service. We will start aspirin daily and monitor his  blood pressure closely. We will  check MRI and MRA, carotid Dopplers,  echocardiogram. We will check lipids, homocystine level, urine drug screen,  alcohol level. We will get PT and OT consult. We will keep the patient  n.p.o. and start IV fluids at 100 cc per hour until he is cleared and then  swallowing evaluation. We will place the patient on deep vein thrombosis  prophylaxis.      Bevelyn Buckles. Nash Shearer, M.D.  Electronically Signed     DRC/MEDQ  D:  11/22/2005  T:  11/22/2005  Job:  [id number]

## 2011-07-20 LAB — POCT CARDIAC MARKERS
CKMB, poc: <1 — ABNORMAL LOW
Myoglobin, poc: 37.1
Myoglobin, poc: 44.9
Operator id: 270651

## 2011-07-20 LAB — COMPREHENSIVE METABOLIC PANEL
ALT: 47
AST: 32
Albumin: 3.4 — ABNORMAL LOW
CO2: 29
Chloride: 102
Creatinine, Ser: 0.69
GFR calc Af Amer: >60
GFR calc non Af Amer: >60
Potassium: 3.7
Sodium: 137
Total Bilirubin: 0.7

## 2011-07-20 LAB — TROPONIN I
Troponin I: 0.02
Troponin I: 0.02

## 2011-07-20 LAB — CBC
HCT: 43.3
MCHC: 34.8
MCV: 91.5
MCV: 92.4
Platelets: 327
Platelets: 335
RBC: 4.43
WBC: 10.5
WBC: 13.7 — ABNORMAL HIGH

## 2011-07-20 LAB — CK TOTAL AND CKMB (NOT AT ARMC)
CK, MB: 0.9
CK, MB: 0.9
CK, MB: 1.1
Relative Index: INVALID
Total CK: 44
Total CK: 47

## 2011-07-20 LAB — POCT I-STAT, CHEM 8
BUN: 9
Creatinine, Ser: 0.7
Glucose, Bld: 102 — ABNORMAL HIGH
Hemoglobin: 15.3
Potassium: 3.7

## 2011-07-20 LAB — DIFFERENTIAL
Eosinophils Absolute: 0
Eosinophils Relative: 0
Lymphs Abs: 1
Monocytes Relative: 7

## 2011-07-20 LAB — HEPATIC FUNCTION PANEL
Alkaline Phosphatase: 99
Indirect Bilirubin: 1.1 — ABNORMAL HIGH
Total Bilirubin: 1.4 — ABNORMAL HIGH
Total Protein: 7.1

## 2011-07-20 LAB — PROTIME-INR
INR: 1.2
Prothrombin Time: 15.7 — ABNORMAL HIGH

## 2011-07-20 LAB — URINALYSIS, ROUTINE W REFLEX MICROSCOPIC
Bilirubin Urine: NEGATIVE
Glucose, UA: NEGATIVE
Hgb urine dipstick: NEGATIVE
Urobilinogen, UA: 1
pH: 5.5

## 2011-07-20 LAB — TSH: TSH: 1.67

## 2011-07-20 LAB — APTT: aPTT: 31

## 2011-07-20 LAB — AMYLASE: Amylase: 28

## 2011-07-20 LAB — LIPID PANEL
Triglycerides: 91
VLDL: 18
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
## 2012-01-06 ENCOUNTER — Encounter (HOSPITAL_COMMUNITY): Payer: Self-pay | Admitting: Internal Medicine

## 2012-01-06 ENCOUNTER — Other Ambulatory Visit: Payer: Self-pay

## 2012-01-06 ENCOUNTER — Emergency Department (HOSPITAL_COMMUNITY): Payer: BC Managed Care – PPO

## 2012-01-06 ENCOUNTER — Observation Stay (HOSPITAL_COMMUNITY)
Admission: EM | Admit: 2012-01-06 | Discharge: 2012-01-07 | Disposition: A | Discharge status: Home / Self Care | Payer: BC Managed Care – PPO | Attending: Internal Medicine | Admitting: Internal Medicine

## 2012-01-06 DIAGNOSIS — R42 Dizziness and giddiness: Secondary | ICD-10-CM | POA: Insufficient documentation

## 2012-01-06 DIAGNOSIS — M79609 Pain in unspecified limb: Secondary | ICD-10-CM | POA: Insufficient documentation

## 2012-01-06 DIAGNOSIS — R112 Nausea with vomiting, unspecified: Secondary | ICD-10-CM | POA: Insufficient documentation

## 2012-01-06 DIAGNOSIS — R7309 Other abnormal glucose: Secondary | ICD-10-CM | POA: Insufficient documentation

## 2012-01-06 DIAGNOSIS — H8110 Benign paroxysmal vertigo, unspecified ear: Principal | ICD-10-CM | POA: Insufficient documentation

## 2012-01-06 DIAGNOSIS — R0789 Other chest pain: Secondary | ICD-10-CM | POA: Insufficient documentation

## 2012-01-06 DIAGNOSIS — G459 Transient cerebral ischemic attack, unspecified: Secondary | ICD-10-CM

## 2012-01-06 DIAGNOSIS — R030 Elevated blood-pressure reading, without diagnosis of hypertension: Secondary | ICD-10-CM

## 2012-01-06 DIAGNOSIS — R209 Unspecified disturbances of skin sensation: Secondary | ICD-10-CM | POA: Insufficient documentation

## 2012-01-06 DIAGNOSIS — I2510 Atherosclerotic heart disease of native coronary artery without angina pectoris: Secondary | ICD-10-CM | POA: Insufficient documentation

## 2012-01-06 DIAGNOSIS — Z8673 Personal history of transient ischemic attack (TIA), and cerebral infarction without residual deficits: Secondary | ICD-10-CM | POA: Insufficient documentation

## 2012-01-06 DIAGNOSIS — R079 Chest pain, unspecified: Secondary | ICD-10-CM | POA: Insufficient documentation

## 2012-01-06 HISTORY — DX: Transient cerebral ischemic attack, unspecified: G45.9

## 2012-01-06 HISTORY — DX: Unspecified intestinal obstruction, unspecified as to partial versus complete obstruction: K56.609

## 2012-01-06 HISTORY — DX: Other cerebral infarction due to occlusion or stenosis of small artery: I63.81

## 2012-01-06 HISTORY — DX: Essential (primary) hypertension: I10

## 2012-01-06 HISTORY — DX: Occlusion and stenosis of right carotid artery: I65.21

## 2012-01-06 HISTORY — DX: Atherosclerotic heart disease of native coronary artery without angina pectoris: I25.10

## 2012-01-06 HISTORY — DX: Cerebral infarction, unspecified: I63.9

## 2012-01-06 LAB — CREATININE, SERUM
Creatinine, Ser: 0.43 mg/dL — ABNORMAL LOW (ref 0.50–1.35)
GFR calc Af Amer: >90 mL/min (ref >90)
GFR calc non Af Amer: >90 mL/min (ref >90)

## 2012-01-06 LAB — POCT I-STAT TROPONIN I: Troponin i, poc: 0.01 ng/mL (ref 0.00–0.08)

## 2012-01-06 LAB — CBC
Hemoglobin: 15.8 g/dL (ref 13.0–17.0)
MCHC: 36.6 g/dL — ABNORMAL HIGH (ref 30.0–36.0)
MCHC: 36.8 g/dL — ABNORMAL HIGH (ref 30.0–36.0)
Platelets: 293 10*3/uL (ref 150–400)
Platelets: 275 10*3/uL (ref 150–400)
RBC: 4.92 MIL/uL (ref 4.22–5.81)
RDW: 12.7 % (ref 11.5–15.5)
WBC: 8.7 10*3/uL (ref 4.0–10.5)

## 2012-01-06 LAB — POCT I-STAT, CHEM 8
Calcium, Ion: 1.1 mmol/L — ABNORMAL LOW (ref 1.12–1.32)
Chloride: 103 mEq/L (ref 96–112)
HCT: 46 % (ref 39.0–52.0)
Hemoglobin: 15.6 g/dL (ref 13.0–17.0)
Potassium: 3.8 mEq/L (ref 3.5–5.1)

## 2012-01-06 LAB — KETONES, URINE: Ketones, ur: NEGATIVE mg/dL

## 2012-01-06 LAB — GLUCOSE, CAPILLARY: Glucose-Capillary: 141 mg/dL — ABNORMAL HIGH (ref 70–99)

## 2012-01-06 MED ORDER — IBUPROFEN 600 MG PO TABS
600.0000 mg | ORAL_TABLET | Freq: Four times a day (QID) | ORAL | Status: DC | PRN
  Administered 2012-01-06 – 2012-01-07 (×2): 600 mg via ORAL
  Filled 2012-01-06 (×2): qty 1

## 2012-01-06 MED ORDER — MECLIZINE HCL 25 MG PO TABS
25.0000 mg | ORAL_TABLET | Freq: Once | ORAL | Status: AC
  Administered 2012-01-06: 25 mg via ORAL
  Filled 2012-01-06: qty 1

## 2012-01-06 MED ORDER — ASPIRIN 325 MG PO TABS
325.0000 mg | ORAL_TABLET | Freq: Every day | ORAL | Status: DC
  Administered 2012-01-06 – 2012-01-07 (×2): 325 mg via ORAL
  Filled 2012-01-06 (×2): qty 1

## 2012-01-06 MED ORDER — HEPARIN SODIUM (PORCINE) 5000 UNIT/ML IJ SOLN
5000.0000 [IU] | Freq: Three times a day (TID) | INTRAMUSCULAR | Status: DC
  Administered 2012-01-06 – 2012-01-07 (×2): 5000 [IU] via SUBCUTANEOUS
  Filled 2012-01-06 (×6): qty 1

## 2012-01-06 MED ORDER — SODIUM CHLORIDE 0.9 % IV SOLN
INTRAVENOUS | Status: DC
  Administered 2012-01-06 (×3): via INTRAVENOUS

## 2012-01-06 NOTE — ED Notes (Signed)
CALLED CARE LINK TO PAGED A  CODE STROKE.

## 2012-01-06 NOTE — Consult Note (Signed)
Chief Complaint: "right hand tingling and numbness"  HPI: [name] is an 61 y.o. male who stood up at around 12 pm today and developed vertigo and right arm numbness and tingling. Symptoms had a sudden onset and were still ongoing during the time of the evaluation in ED. NIHSS of 1 for right hand numbness. The tingling was only present in the hand. Patient states that he has had these symptoms before in the past several times upon sudden standing up. The prior symptoms of heaviness were not in his left arm and face. CT head: Remote lacunar infarcts are present on the right at the caudate head and anterior limb of the right internal capsule.   LSN: 12:00 pm tPA Given: No: NIHSS of 1. mRankin: 0  PMH: CVA  No past surgical history on file.  No family history on file. Social History:  does not have a smoking history on file. He does not have any smokeless tobacco history on file. His alcohol and drug histories not on file.  Allergies:  Allergies  Allergen Reactions  . Penicillins Other (See Comments)    Throat swells up   Medications: I have reviewed the patient's current medications.  ROS: As above  Physical Examination: Blood pressure 149/97, pulse 62, temperature 97.7 F (36.5 C), temperature source Oral, resp. rate 20, SpO2 96.00%.  Neurologic Examination: MS: AAO*3, no aphasia, follows complex commands CN: EOMI, PERRL, no facial asymmetry, VFF, V1-V3 intact b/l, tongue midline, no dysarthria Motor: no drift, 5/5 strength throughout Sensory: reduced pinprick sensation in right hand, otherwise unremarkable Coord: F to N is intact b/l Reflexes: 2+ in UE, 1+ in LE, mute plantars b/l Gait: deferred  Results for orders placed during the hospital encounter of 01/06/12 (from the past 48 hour(s))  POCT I-STAT, CHEM 8     Status: Abnormal   Collection Time   01/06/12  1:23 PM      Component Value Range Comment   Sodium 142  135 - 145 (mEq/L)    Potassium 3.8  3.5 - 5.1  (mEq/L)    Chloride 103  96 - 112 (mEq/L)    BUN 8  6 - 23 (mg/dL)    Creatinine, Ser 3.24  0.50 - 1.35 (mg/dL)    Glucose, Bld 401 (*) 70 - 99 (mg/dL)    Calcium, Ion 0.27 (*) 1.12 - 1.32 (mmol/L)    TCO2 26  0 - 100 (mmol/L)    Hemoglobin 15.6  13.0 - 17.0 (g/dL)    HCT 25.3  66.4 - 40.3 (%)   GLUCOSE, CAPILLARY     Status: Abnormal   Collection Time   01/06/12  1:59 PM      Component Value Range Comment   Glucose-Capillary 141 (*) 70 - 99 (mg/dL)    Ct Head Wo Contrast  01/06/2012  *RADIOLOGY REPORT*  Clinical Data: Code stroke.  Right arm numbness.  CT HEAD WITHOUT CONTRAST  Technique:  Contiguous axial images were obtained from the base of the skull through the vertex without contrast.  Comparison: CT head without contrast 05/02/2010 at [facility].  Findings: Remote lacunar infarcts are present on the right at the caudate head and anterior limb of the right internal capsule. Periventricular white matter changes are similar to the prior exam. No acute cortical infarct, hemorrhage, or mass lesion is evident. The ventricles are proportionate to the degree of atrophy.  The paranasal sinuses and mastoid air cells are clear.  The osseous skull is intact.  IMPRESSION:  1.  No acute intracranial abnormality or significant interval change. 2.  Stable lacunar infarcts of the right basal ganglia. 3.  Stable atrophy and white matter disease.  These results were called by telephone on 01/06/2012  at  01:30 p.m. to  Dr. Weldon [name], who verbally acknowledged these results.  Original Report Authenticated By: [name]. [name], M.D.   Assessment: 61 y.o. male   Stroke Risk Factors - CVA  Plan: 1. HgbA1c, fasting lipid panel 2. MRI, MRA  of the brain without contrast unless contraindicated 3. PT consult, OT consult, Speech consult 4. Echocardiogram 5. Carotid dopplers 6. Prophylactic therapy-Antiplatelet med: Aspirin - dose 81 mg PO daily 7. Risk factor modification 8. NPO until Nursing  Swallow Eval 9. Telemetry 10. Neurochecks q4h 11. Check orthostatics 12. Vital signs q4h 13. Keep MAP b/w 120-130 - permissive hypertension - If MRI is negative for a stroke, would recommend tight blood pressure control 14. IV fluids if BP is on low/normal side  [name] 01/06/2012, 2:07 PM
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
## 2012-01-06 NOTE — ED Notes (Signed)
Dizziness and lightheaded x 40 mins, and right arm feels heavy but no deficits. Pt sts onset was 1200 noon

## 2012-01-06 NOTE — Code Documentation (Signed)
Encoded at 1307, called at 1307, patient arrived 1300, stroke team 1311, patient in ct scan 1310, lab at 1310 ct read 1316.  LSN 1200, as per patient, he was standing and became dizzy and lightheaded with right arm tingly.  NIHSS 1, cancelled at 1330`

## 2012-01-06 NOTE — ED Notes (Signed)
Admitting MDs at bedside.

## 2012-01-06 NOTE — ED Notes (Signed)
Pt's CBG was 141 when I checked it.2:00 pm JG.

## 2012-01-06 NOTE — ED Provider Notes (Signed)
History     CSN: [id number]  Arrival date & time 01/06/12  1233   First MD Initiated Contact with Patient 01/06/12 1334      Chief Complaint  Patient presents with  . Dizziness  . Arm Pain  . Chest Pain    (Consider location/radiation/quality/duration/timing/severity/associated sxs/prior treatment) Patient is a 61 y.o. male presenting with arm pain and chest pain. The history is provided by the patient and the EMS personnel.  Arm Pain Associated symptoms include chest pain. Pertinent negatives include no abdominal pain, no headaches and no shortness of breath.  Chest Pain Primary symptoms include nausea, vomiting and dizziness. Pertinent negatives for primary symptoms include no fever, no shortness of breath, no cough, no palpitations and no abdominal pain.  Dizziness also occurs with nausea and vomiting.     the patient is a 61 year old male, with a history of myocardial infarction, and stroke, who presents to emergency department stating that he had 2 episodes of lightheadedness followed by vertigo with vomiting.  He also had mild chest tightness and heaviness in his right arm.  His symptoms are resolved now.  He says that he got up.  This morning, and went to get his wife.  A cup of coffee, when he returned.  He stood up and he became lightheaded.  That resolved, and he went into the bathroom and he became lightheaded, again, while he was in the bathroom.  He developed the vertigo, and one episode of vomiting.  He called EMS and was brought to the emergency department for further evaluation.  He takes a baby aspirin daily.  He does not smoke cigarettes.  He denies recent illness.  No past medical history on file.  No past surgical history on file.  No family history on file.  History  Substance Use Topics  . Smoking status: Not on file  . Smokeless tobacco: Not on file  . Alcohol Use: Not on file      Review of Systems  Constitutional: Negative for fever and chills.    HENT: Negative for neck pain.   Eyes: Negative for visual disturbance.  Respiratory: Positive for chest tightness. Negative for cough and shortness of breath.   Cardiovascular: Positive for chest pain. Negative for palpitations and leg swelling.  Gastrointestinal: Positive for nausea and vomiting. Negative for abdominal pain and diarrhea.  Genitourinary: Negative for dysuria.  Neurological: Positive for dizziness and light-headedness. Negative for headaches.  Psychiatric/Behavioral: Negative for confusion.  All other systems reviewed and are negative.    Allergies  Penicillins  Home Medications   Current Outpatient Rx  Name Route Sig Dispense Refill  . ASPIRIN EC 81 MG PO TBEC Oral Take 81 mg by mouth daily as needed. For pain      BP 149/97  Pulse 62  Temp(Src) 97.7 F (36.5 C) (Oral)  Resp 20  SpO2 96%  Physical Exam  Vitals reviewed. Constitutional: He is oriented to person, place, and time. He appears well-developed and well-nourished. No distress.  HENT:  Head: Normocephalic and atraumatic.  Eyes: EOM are normal. Pupils are equal, round, and reactive to light.  Neck: Normal range of motion. Neck supple.  Cardiovascular: Normal rate, regular rhythm and normal heart sounds.   No murmur heard. Pulmonary/Chest: Effort normal and breath sounds normal. No respiratory distress. He has no wheezes. He has no rales.  Abdominal: Soft. Bowel sounds are normal. He exhibits no distension and no mass. There is no tenderness. There is no rebound and no guarding.  Musculoskeletal: Normal range of motion. He exhibits no edema and no tenderness.  Neurological: He is alert and oriented to person, place, and time. No cranial nerve deficit.       Normal strength in all 4 extremities  Skin: Skin is warm and dry. He is not diaphoretic.  Psychiatric: He has a normal mood and affect. Thought content normal.    ED Course  Procedures (including critical care time) 61 year old male, with  known coronary disease, and history of stroke, presents emergency department with lightheadedness, nausea, and vomiting, and chest tightness and right arm heaviness, which are resolved now.  He got a normal neurological examination.  Code stroke, was called.  CT shows evidence of old infarct, but nothing acute.  Neurology was consulted, and they suggest that he be admitted for evaluation of possible TIA.  He is presently asymptomatic.  There is no indication for acute intervention.  Labs Reviewed  POCT I-STAT, CHEM 8 - Abnormal; Notable for the following:    Glucose, Bld 144 (*)    Calcium, Ion 1.10 (*)    All other components within normal limits  GLUCOSE, CAPILLARY - Abnormal; Notable for the following:    Glucose-Capillary 141 (*)    All other components within normal limits  CBC  KETONES, URINE   Ct Head Wo Contrast  01/06/2012  *RADIOLOGY REPORT*  Clinical Data: Code stroke.  Right arm numbness.  CT HEAD WITHOUT CONTRAST  Technique:  Contiguous axial images were obtained from the base of the skull through the vertex without contrast.  Comparison: CT head without contrast 05/02/2010 at [facility].  Findings: Remote lacunar infarcts are present on the right at the caudate head and anterior limb of the right internal capsule. Periventricular white matter changes are similar to the prior exam. No acute cortical infarct, hemorrhage, or mass lesion is evident. The ventricles are proportionate to the degree of atrophy.  The paranasal sinuses and mastoid air cells are clear.  The osseous skull is intact.  IMPRESSION:  1.  No acute intracranial abnormality or significant interval change. 2.  Stable lacunar infarcts of the right basal ganglia. 3.  Stable atrophy and white matter disease.  These results were called by telephone on 01/06/2012  at  01:30 p.m. to  Dr. Weldon Inches, who verbally acknowledged these results.  Original Report Authenticated By: Jamesetta Orleans. MATTERN, M.D.     No diagnosis  found.  ED ECG REPORT   Date: 01/06/2012  EKG Time: 2:27 PM  Rate: 66  Rhythm: normal sinus rhythm,    Axis: nl  Intervals:none  ST&T Change: none  Narrative Interpretation: nsr       .now I spoke with the teaching service. They will admit for eval of tia.     MDM  Possible TIA. Patient is presently asymptomatic with a completely normal.  Neurological examination.  CT does not show any new stroke        Cheri Guppy, MD 01/06/12 1510

## 2012-01-07 ENCOUNTER — Inpatient Hospital Stay (HOSPITAL_COMMUNITY): Payer: BC Managed Care – PPO

## 2012-01-07 ENCOUNTER — Encounter (HOSPITAL_COMMUNITY): Payer: Self-pay | Admitting: *Deleted

## 2012-01-07 LAB — CBC
HCT: 42.2 % (ref 39.0–52.0)
MCH: 31.3 pg (ref 26.0–34.0)
MCHC: 35.1 g/dL (ref 30.0–36.0)
Platelets: 259 10*3/uL (ref 150–400)
RDW: 12.7 % (ref 11.5–15.5)
WBC: 6.1 10*3/uL (ref 4.0–10.5)

## 2012-01-07 LAB — LIPID PANEL
Cholesterol: 142 mg/dL (ref 0–200)
HDL: 17 mg/dL — ABNORMAL LOW (ref >39)

## 2012-01-07 LAB — BASIC METABOLIC PANEL
BUN: 12 mg/dL (ref 6–23)
Chloride: 107 mEq/L (ref 96–112)
GFR calc Af Amer: >90 mL/min (ref >90)
GFR calc non Af Amer: >90 mL/min (ref >90)
Potassium: 3.6 mEq/L (ref 3.5–5.1)

## 2012-01-07 LAB — RAPID URINE DRUG SCREEN, HOSP PERFORMED
Amphetamines: NOT DETECTED
Barbiturates: NOT DETECTED
Benzodiazepines: NOT DETECTED
Tetrahydrocannabinol: NOT DETECTED

## 2012-01-07 MED ORDER — MECLIZINE HCL 25 MG PO TABS: 25.0000 mg | ORAL_TABLET | Freq: Three times a day (TID) | ORAL | Status: AC | PRN

## 2012-01-07 MED ORDER — MECLIZINE HCL 25 MG PO TABS
25.0000 mg | ORAL_TABLET | Freq: Once | ORAL | Status: AC
  Administered 2012-01-07: 25 mg via ORAL
  Filled 2012-01-07: qty 1

## 2012-01-07 NOTE — Progress Notes (Signed)
PT EVALUATION  Pt is at his baseline functional level, no acute PT needs.   01/07/12 1100  PT Visit Information  Last PT Received On 01/07/12  Home Living  Lives With Spouse  Receives Help From Family  Type of Home Other (Comment) (motel)  Home Layout One level  Home Access Level entry  Bathroom Shower/Tub Tub/shower unit;Curtain  Horticulturist, commercial Yes  How Accessible Accessible via walker  Home Adaptive Equipment None  Prior Function  Level of Independence Independent with basic ADLs;Independent with homemaking with ambulation;Independent with transfers;Independent with gait  Able to Take Stairs? 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  Leisure Hobbies-yes (Comment)  Comments hunting  Cognition  Arousal/Alertness Awake/alert  Overall Cognitive Status Appears within functional limits for tasks assessed  Orientation Level Oriented X4  Bed Mobility  Bed Mobility Yes  Supine to Sit 6: Modified independent (Device/Increase time)  Sitting - Scoot to Edge of Bed 6: Modified independent (Device/Increase time)  Transfers  Transfers Yes  Sit to Stand 6: Modified independent (Device/Increase time)  Stand to Sit 6: Modified independent (Device/Increase time)  Ambulation/Gait  Ambulation/Gait Yes  Ambulation/Gait Assistance 5: Supervision  Ambulation/Gait Assistance Details (indicate cue type and reason) Supervision for safety throughout. Pt with no gait deviations with head turns R/L and up/down as well as sudden stops and turns.  Ambulation Distance (Feet) 200 Feet  Assistive device None  Gait Pattern WFL  Gait velocity normal gait speed  Modified Rankin (Stroke Patients Only)  Modified Rankin 1  Balance  Balance Assessed Yes (see above ambulation)  RLE Assessment  RLE Assessment WFL  LLE Assessment  LLE Assessment WFL  PT - End of Session  Equipment Utilized During Treatment Gait belt  Activity  Tolerance Patient tolerated treatment well  Patient left with call bell in reach;in bed  Nurse Communication Mobility status for transfers;Mobility status for ambulation  General  Behavior During Session [facility] for tasks performed  Cognition [facility] for tasks performed  PT Assessment  Clinical Impression Statement Pt presents with a medical diagnosis of TIA vs possible vestibular issues. Pt is at his baseline functional level with no dizziness even when provoked. No acute PT needs.   PT Recommendation/Assessment Patent does not need any further PT services  No Skilled PT All education completed;Patient at baseline level of functioning;Patient will have necessary level of assist by caregiver at discharge  PT Recommendation  Follow Up Recommendations No PT follow up  Equipment Recommended None recommended by PT    01/07/2012 Milana Kidney DPT PAGER: [phone] OFFICE: [phone]

## 2012-01-07 NOTE — H&P (Signed)
Internal Medicine Attending Admission Note Date: 01/07/2012  Patient name: [name] Medical record number: [id number] Date of birth: 06-21-1951 Age: 61 y.o. Gender: male  I saw and evaluated the patient. I reviewed the resident's note and I agree with the resident's findings and plan as documented in the resident's note.  Chief Complaint(s):  The room was spinning, nausea, vomiting.  History - key components related to admission:  [name] is a 61 year old man with a history of prior cerebrovascular accidents, hypertension, and possible coronary artery disease who presents with vertigo. He was in his usual state of health on the morning of admission when he woke up feeling fine. He took the dogs out and went for a walk. He returned home, ate breakfast, and laid back down to watch TV. When he got up to go to the bathroom he noted lightheadedness and the room spinning upon standing. He sat back down felt a little better and then proceeded to the bathroom. After sitting on the toilet he noted the room was still spinning and he had non bloody emesis in the bathtub. With these symptoms and a previous history of stroke he presented to the emergency department for further evaluation. Soon after receiving an aspirin and meclizine his symptoms resolved. He currently feels well and is without any complaints.  Physical Exam - key components related to admission:  Filed Vitals:   01/06/12 2320 01/07/12 0110 01/07/12 0300 01/07/12 0602  BP: 165/95 148/77 151/81 138/89  Pulse:  63 62 62  Temp:  97.4 F (36.3 C) 98 F (36.7 C) 97.5 F (36.4 C)  TempSrc:  Oral Oral Oral  Resp:  18 18 20   Height:      Weight:      SpO2:  93% 95% 95%   General: Well-developed, well-nourished, man looking somewhat like a skinny Santa Claus lying comfortably in bed in no acute distress. Heart: Regular rate and rhythm with a 2/6 systolic murmur at the apex which radiates to the axilla. There are no gallops or  rubs. Lungs: Clear to auscultation bilaterally without wheezes, rhonchi, or rales. Abdomen: Soft, non tender, active bowel sounds. Extremities: Without edema. Neuro: Strength 5/5 throughout, sensation grossly intact, cerebellar testing intact, cranial nerves II through XII intact except for lateral mastitis on extra ocular muscle movements.  Lab results:  Basic Metabolic Panel:  Basename 01/07/12 0635 01/06/12 1745 01/06/12 1323  NA 141 -- 142  K 3.6 -- 3.8  CL 107 -- 103  CO2 23 -- --  GLUCOSE 139* -- 144*  BUN 12 -- 8  CREATININE 0.48* 0.43* --  CALCIUM 8.4 -- --  MG -- -- --  PHOS -- -- --   CBC:  Basename 01/07/12 0635 01/06/12 1745  WBC 6.1 8.7  NEUTROABS -- --  HGB 14.8 16.0  HCT 42.2 43.5  MCV 89.2 88.4  PLT 259 275   CBG:  Basename 01/06/12 1359  GLUCAP 141*   Hemoglobin A1C:  Basename 01/06/12 1745  HGBA1C 7.1*   Fasting Lipid Panel:  Basename 01/07/12 0635  CHOL 142  HDL 17*  LDLCALC 89  TRIG 782*  CHOLHDL 8.4  LDLDIRECT --   Imaging results:  Ct Head Wo Contrast  01/06/2012  *RADIOLOGY REPORT*  Clinical Data: Code stroke.  Right arm numbness.  CT HEAD WITHOUT CONTRAST  Technique:  Contiguous axial images were obtained from the base of the skull through the vertex without contrast.  Comparison: CT head without contrast 05/02/2010 at [facility].  Findings: Remote lacunar infarcts are present on the right at the caudate head and anterior limb of the right internal capsule. Periventricular white matter changes are similar to the prior exam. No acute cortical infarct, hemorrhage, or mass lesion is evident. The ventricles are proportionate to the degree of atrophy.  The paranasal sinuses and mastoid air cells are clear.  The osseous skull is intact.  IMPRESSION:  1.  No acute intracranial abnormality or significant interval change. 2.  Stable lacunar infarcts of the right basal ganglia. 3.  Stable atrophy and white matter disease.  These results  were called by telephone on 01/06/2012  at  01:30 p.m. to  Dr. Weldon Inches, who verbally acknowledged these results.  Original Report Authenticated By: Jamesetta Orleans. MATTERN, M.D.   Mr [name] Head/brain Wo Cm  01/07/2012  *RADIOLOGY REPORT*  Clinical Data:  TIA.  Dizziness and right arm pain.  MRI HEAD WITHOUT CONTRAST MRA HEAD WITHOUT CONTRAST  Technique:  Multiplanar, multiecho pulse sequences of the brain and surrounding structures were obtained without intravenous contrast. Angiographic images of the head were obtained using MRA technique without contrast.  Comparison:  CT head without contrast 01/05/2002.  MRI brain without contrast 07/16/2009 at [facility].  MRI HEAD  Findings:  The diffusion weighted images demonstrate no evidence for acute or subacute infarction.  Remote lacunar infarcts of the right basal ganglia are present.  Atrophy and periventricular white matter changes are somewhat advanced for age.  The ventricles are proportionate to the degree of atrophy.  Flow is present in the major intracranial arteries.  The globes and orbits are intact. The paranasal sinuses are clear.  There is some fluid in the mastoid air cells bilaterally.  No obstructing nasopharyngeal lesion is evident.  IMPRESSION:  1.  No acute intracranial abnormality or significant interval change. 2.  Stable remote lacunar infarcts of the right basal ganglia. 3.  Atrophy white matter disease is somewhat advanced for age.  MRA HEAD  Findings:  The internal carotid arteries are within normal limits from high cervical segments through the ICA termini.  The A1 and M1 segments are normal.  The anterior communicating artery is patent.  The MCA bifurcations are within normal limits bilaterally.  Mild distal small vessel disease is evident.  The vertebral arteries are codominant.  The left PICA origin is visualized and within normal limits.  The right AICA is dominant. The basilar artery is within normal limits.  Both posterior  cerebral arteries originate from the basilar tip.  There is minimal attenuation of distal PCA branch vessels.  IMPRESSION:  1.  Stable mild small vessel disease. 2.  No significant proximal stenosis, aneurysm, or branch vessel occlusion.  Original Report Authenticated By: Jamesetta Orleans. MATTERN, M.D.   Other results:  EKG:  Normal sinus rhythm at 66 beats per minute, normal axis and intervals, no left ventricular hypertrophy by voltage, 1 mm ST elevation in V2 through V3. Unchanged from the previous ECG in July 2011.  Assessment & Plan by Problem:  [name] is a 61 year old man who who presents with vertigo. Neurologic exam is only remarkable for lateral nystagmus and MRI imaging of the brain reveals no acute infarct. He likely has benign positional vertigo as the cause of his symptoms.  1) Vertigo: Will treat symptomatically with meclizine. We will also consult physical therapy to provide vestibular rehabilitation exercises. No further neurologic workup is necessary at this time unless the above therapy is not improving his symptoms. I agree with the  housestaff's plan to discharge him home with these vestibular exercises. Follow up in his primary care provider's office.

## 2012-01-07 NOTE — Discharge Summary (Signed)
Internal Medicine Teaching [facility] Discharge Note  Name: [name] MRN: [id number] DOB: 03-23-1951 61 y.o.  Date of Admission: 01/06/2012 12:45 PM Date of Discharge: 01/07/2012 Attending Physician: Burns Spain, MD  Discharge Diagnosis: # Vertigo, benign positional # h/o multiple CVA # Elevated BP, no diagnosis of HTN  Discharge Medications: Medication List  As of 01/07/2012  1:38 PM   TAKE these medications         aspirin EC 81 MG tablet   Take 81 mg by mouth daily as needed. For pain      meclizine 25 MG tablet   Commonly known as: ANTIVERT   Take 1 tablet (25 mg total) by mouth 3 (three) times daily as needed for dizziness or nausea.            Disposition and follow-up:   Mr.[name] was discharged from [facility] in Good condition.    Follow-up Appointments: Follow-up Information    Follow up with Primary Care Provider. Schedule an appointment as soon as possible for a visit in 1 week.        Discharge Orders    Future Orders Please Complete By Expires   Diet - low sodium heart healthy      Increase activity slowly      Discharge instructions      Comments:   Please be sure to schedule a follow up appointment with a primary care provider within 1-2 weeks.  If you have new or worsening symptoms, please go to your nearest emergency department.  You may take meclizine as instructed to help with dizziness.   Call MD for:  temperature >100.4      Call MD for:  persistant nausea and vomiting      Call MD for:  severe uncontrolled pain      Call MD for:  difficulty breathing, headache or visual disturbances      Call MD for:  hives      Call MD for:  persistant dizziness or light-headedness      Call MD for:  extreme fatigue         Consultations:  Neurology (Dr. Lyman Speller)  Procedures Performed:  Ct Head Wo Contrast 01/06/2012  *RADIOLOGY REPORT*  Clinical Data: Code stroke.  Right arm numbness.  CT HEAD WITHOUT CONTRAST   Technique:  Contiguous axial images were obtained from the base of the skull through the vertex without contrast.  Comparison: CT head without contrast 05/02/2010 at [facility].  Findings: Remote lacunar infarcts are present on the right at the caudate head and anterior limb of the right internal capsule. Periventricular white matter changes are similar to the prior exam. No acute cortical infarct, hemorrhage, or mass lesion is evident. The ventricles are proportionate to the degree of atrophy.  The paranasal sinuses and mastoid air cells are clear.  The osseous skull is intact.  IMPRESSION:  1.  No acute intracranial abnormality or significant interval change. 2.  Stable lacunar infarcts of the right basal ganglia. 3.  Stable atrophy and white matter disease.  These results were called by telephone on 01/06/2012  at  01:30 p.m. to  Dr. Weldon Inches, who verbally acknowledged these results.  Original Report Authenticated By: Jamesetta Orleans. MATTERN, M.D.   Mr [name] Head/brain Wo Cm 01/07/2012  *RADIOLOGY REPORT*  Clinical Data:  TIA.  Dizziness and right arm pain.  MRI HEAD WITHOUT CONTRAST MRA HEAD WITHOUT CONTRAST  Technique:  Multiplanar, multiecho pulse sequences  of the brain and surrounding structures were obtained without intravenous contrast. Angiographic images of the head were obtained using MRA technique without contrast.  Comparison:  CT head without contrast 01/05/2002.  MRI brain without contrast 07/16/2009 at [facility].  MRI HEAD  Findings:  The diffusion weighted images demonstrate no evidence for acute or subacute infarction.  Remote lacunar infarcts of the right basal ganglia are present.  Atrophy and periventricular white matter changes are somewhat advanced for age.  The ventricles are proportionate to the degree of atrophy.  Flow is present in the major intracranial arteries.  The globes and orbits are intact. The paranasal sinuses are clear.  There is some fluid in the mastoid air  cells bilaterally.  No obstructing nasopharyngeal lesion is evident.  IMPRESSION:  1.  No acute intracranial abnormality or significant interval change. 2.  Stable remote lacunar infarcts of the right basal ganglia. 3.  Atrophy white matter disease is somewhat advanced for age.  MRA HEAD  Findings:  The internal carotid arteries are within normal limits from high cervical segments through the ICA termini.  The A1 and M1 segments are normal.  The anterior communicating artery is patent.  The MCA bifurcations are within normal limits bilaterally.  Mild distal small vessel disease is evident.  The vertebral arteries are codominant.  The left PICA origin is visualized and within normal limits.  The right AICA is dominant. The basilar artery is within normal limits.  Both posterior cerebral arteries originate from the basilar tip.  There is minimal attenuation of distal PCA branch vessels.  IMPRESSION:  1.  Stable mild small vessel disease. 2.  No significant proximal stenosis, aneurysm, or branch vessel occlusion.  Original Report Authenticated By: Jamesetta Orleans. MATTERN, M.D.   Admission HPI:  Patient is a 61 yo M who presents after feeling dizzy this morning. He was in his usual state of health on the morning of admission, and layed down on the bed to watch TV after breakfast. He had to get up to use the restroom and felt dizzy on standing, as if the room was spinning. He sat down, symptoms persisted but lessened, and he eventually made it to the bathroom. He had one episode of nonbloody emesis at that point, without feeling nauseated. He c/o burping and heart burn. Also accompanied by right sided, nonradiating chest pressure. He last had similar symptoms 6 months ago when he experienced a stroke. He has had a total of 4 strokes in the past (first stroke 4 years ago, most recent was 6 months ago), and presents with different symptoms preceding each event (sometimes numbness, sometimes HA, sometimes dizziness).  After vomiting, he rested in bed until his daughter brought him to the ED. He notes that he was able to walk to the ambulance, but his legs felt "funny", like rubber.  All symptoms resolved by the time of interview except a chronic bifrontal HA, that started before breakfast, described as pressure like, 2-3/10, better in the dark and when laying down.  He also notes a chronic, intermittent sensation of his right arm feeling heavy, which has occurred since falling from a tree and breaking his right collarbone as well as 6 rib fractures.   Admission Physical Exam:  Blood pressure 151/94, pulse 71, temperature 97.8 F (36.6 C), temperature source Oral, resp. rate 21, SpO2 95.00%.  General: resting in bed, comfortable, no acute distress, cooperative to exam  HEENT: PERRL, EOMI, no scleral icterus, no conjunctival pallor, +horizontal nystagmus on  dix hallpike, normal TM  Cardiac: RRR, no rubs, murmurs or gallops  Pulm: clear to auscultation bilaterally, moving normal volumes of air  Abd: soft, nontender, nondistended, BS normoactive  Ext: warm and well perfused, no pedal edema  Neuro: alert and oriented X3, cranial nerves II-XII grossly intact, normal finger-to-nose, no pronator drift, somewhat positive romberg, strength and sensation grossly intact b/l   Admission Lab results:   Basic Metabolic Panel:  Basename  01/06/12 1323   NA  142   K  3.8   CL  103   CO2  --   GLUCOSE  144*   BUN  8   CREATININE  0.60   CALCIUM  --   MG  --   PHOS  --    CBC:  Basename  01/06/12 1422  01/06/12 1323   WBC  8.1  --   NEUTROABS  --  --   HGB  15.8  15.6   HCT  43.2  46.0   MCV  88.9  --   PLT  293  --    CBG:  Basename  01/06/12 1359   GLUCAP  141*    Urinalysis:  Basename  01/06/12 1449   COLORURINE  --   LABSPEC  --   PHURINE  --   GLUCOSEU  --   HGBUR  --   BILIRUBINUR  --   KETONESUR  NEGATIVE   PROTEINUR  --   UROBILINOGEN  --   NITRITE  --   LEUKOCYTESUR  --     Hospital  Course by problem list:  #Vertigo : Improved on the morning of discharge, resolved by time of discharge.  Unlikely stroke/TIA given normal MRI/MRA. Most likely 2/2 BPPV, especially given improvement of symptoms with meclizine.  Patient worked with PT without any difficulties. No arrhythmias on tele. Meclizine to be continued outpatient for dizziness.  I reviewed some exercises that may help with symptoms as well.  #h/o CAD: no CP; negative cardiac enzyme at admission.  Patient denies h/o stent placement, continued on aspirin during hospitalization.  #h/o HTN: mildly elevated; to be monitored outpatient and consider starting antihypertensive outpatient  #Hypertriglyceridemia: Discussed diet modification with patient.  LDL = 89.  #Borderline DM: HbA1c on 01/06/12 = 7.1.  Discussed Diet modification.   Discharge Vitals:  BP 137/91  Pulse 68  Temp(Src) 98.4 F (36.9 C) (Oral)  Resp 18  Ht 5\' 4"  (1.626 m)  Wt 160 lb (72.576 kg)  BMI 27.46 kg/m2  SpO2 91%  Discharge Labs:  Results for orders placed during the hospital encounter of 01/06/12 (from the past 24 hour(s))  GLUCOSE, CAPILLARY     Status: Abnormal   Collection Time   01/06/12  1:59 PM      Component Value Range   Glucose-Capillary 141 (*) 70 - 99 (mg/dL)  CBC     Status: Abnormal   Collection Time   01/06/12  2:22 PM      Component Value Range   WBC 8.1  4.0 - 10.5 (K/uL)   RBC 4.86  4.22 - 5.81 (MIL/uL)   Hemoglobin 15.8  13.0 - 17.0 (g/dL)   HCT 40.1  02.7 - 25.3 (%)   MCV 88.9  78.0 - 100.0 (fL)   MCH 32.5  26.0 - 34.0 (pg)   MCHC 36.6 (*) 30.0 - 36.0 (g/dL)   RDW 66.4  40.3 - 47.4 (%)   Platelets 293  150 - 400 (K/uL)  KETONES, URINE     Status:  Normal   Collection Time   01/06/12  2:49 PM      Component Value Range   Ketones, ur NEGATIVE  NEGATIVE (mg/dL)  POCT I-STAT TROPONIN I     Status: Normal   Collection Time   01/06/12  2:49 PM      Component Value Range   Troponin i, poc 0.01  0.00 - 0.08 (ng/mL)    Comment 3           HEMOGLOBIN A1C     Status: Abnormal   Collection Time   01/06/12  5:45 PM      Component Value Range   Hemoglobin A1C 7.1 (*) <5.7 (%)   Mean Plasma Glucose 157 (*) <117 (mg/dL)  CBC     Status: Abnormal   Collection Time   01/06/12  5:45 PM      Component Value Range   WBC 8.7  4.0 - 10.5 (K/uL)   RBC 4.92  4.22 - 5.81 (MIL/uL)   Hemoglobin 16.0  13.0 - 17.0 (g/dL)   HCT 16.1  09.6 - 04.5 (%)   MCV 88.4  78.0 - 100.0 (fL)   MCH 32.5  26.0 - 34.0 (pg)   MCHC 36.8 (*) 30.0 - 36.0 (g/dL)   RDW 40.9  81.1 - 91.4 (%)   Platelets 275  150 - 400 (K/uL)  CREATININE, SERUM     Status: Abnormal   Collection Time   01/06/12  5:45 PM      Component Value Range   Creatinine, Ser 0.43 (*) 0.50 - 1.35 (mg/dL)   GFR calc non Af Amer >90  >90 (mL/min)   GFR calc Af Amer >90  >90 (mL/min)  URINE RAPID DRUG SCREEN (HOSP PERFORMED)     Status: Normal   Collection Time   01/07/12  6:14 AM      Component Value Range   Opiates NONE DETECTED  NONE DETECTED    Cocaine NONE DETECTED  NONE DETECTED    Benzodiazepines NONE DETECTED  NONE DETECTED    Amphetamines NONE DETECTED  NONE DETECTED    Tetrahydrocannabinol NONE DETECTED  NONE DETECTED    Barbiturates NONE DETECTED  NONE DETECTED   CBC     Status: Normal   Collection Time   01/07/12  6:35 AM      Component Value Range   WBC 6.1  4.0 - 10.5 (K/uL)   RBC 4.73  4.22 - 5.81 (MIL/uL)   Hemoglobin 14.8  13.0 - 17.0 (g/dL)   HCT 78.2  95.6 - 21.3 (%)   MCV 89.2  78.0 - 100.0 (fL)   MCH 31.3  26.0 - 34.0 (pg)   MCHC 35.1  30.0 - 36.0 (g/dL)   RDW 08.6  57.8 - 46.9 (%)   Platelets 259  150 - 400 (K/uL)  BASIC METABOLIC PANEL     Status: Abnormal   Collection Time   01/07/12  6:35 AM      Component Value Range   Sodium 141  135 - 145 (mEq/L)   Potassium 3.6  3.5 - 5.1 (mEq/L)   Chloride 107  96 - 112 (mEq/L)   CO2 23  19 - 32 (mEq/L)   Glucose, Bld 139 (*) 70 - 99 (mg/dL)   BUN 12  6 - 23 (mg/dL)   Creatinine, Ser 6.29 (*)  0.50 - 1.35 (mg/dL)   Calcium 8.4  8.4 - 52.8 (mg/dL)   GFR calc non Af Amer >90  >90 (mL/min)   GFR calc  Af Amer >90  >90 (mL/min)  LIPID PANEL     Status: Abnormal   Collection Time   01/07/12  6:35 AM      Component Value Range   Cholesterol 142  0 - 200 (mg/dL)   Triglycerides 914 (*) <150 (mg/dL)   HDL 17 (*) >78 (mg/dL)   Total CHOL/HDL Ratio 8.4     VLDL 36  0 - 40 (mg/dL)   LDL Cholesterol 89  0 - 99 (mg/dL)    Signed: Vernice Jefferson 01/07/2012, 1:38 PM

## 2012-01-07 NOTE — Discharge Instructions (Signed)
Vertigo Vertigo means you feel like you or your surroundings are moving when they are not. Vertigo can be dangerous if it occurs when you are at work, driving, or performing difficult activities.  CAUSES  Vertigo occurs when there is a conflict of signals sent to your brain from the visual and sensory systems in your body. There are many different causes of vertigo, including:  Infections, especially in the inner ear.   A bad reaction to a drug or misuse of alcohol and medicines.   Withdrawal from drugs or alcohol.   Rapidly changing positions, such as lying down or rolling over in bed.   A migraine headache.   Decreased blood flow to the brain.   Increased pressure in the brain from a head injury, infection, tumor, or bleeding.  SYMPTOMS  You may feel as though the world is spinning around or you are falling to the ground. Because your balance is upset, vertigo can cause nausea and vomiting. You may have involuntary eye movements (nystagmus). DIAGNOSIS  Vertigo is usually diagnosed by physical exam. If the cause of your vertigo is unknown, your caregiver may perform imaging tests, such as an MRI scan (magnetic resonance imaging). TREATMENT  Most cases of vertigo resolve on their own, without treatment. Depending on the cause, your caregiver may prescribe certain medicines. If your vertigo is related to body position issues, your caregiver may recommend movements or procedures to correct the problem. In rare cases, if your vertigo is caused by certain inner ear problems, you may need surgery. HOME CARE INSTRUCTIONS   Follow your caregiver's instructions.   Avoid driving.   Avoid operating heavy machinery.   Avoid performing any tasks that would be dangerous to you or others during a vertigo episode.   Tell your caregiver if you notice that certain medicines seem to be causing your vertigo. Some of the medicines used to treat vertigo episodes can actually make them worse in some  people.  SEEK IMMEDIATE MEDICAL CARE IF:   Your medicines do not relieve your vertigo or are making it worse.   You develop problems with talking, walking, weakness, or using your arms, hands, or legs.   You develop severe headaches.   Your nausea or vomiting continues or gets worse.   You develop visual changes.   A family member notices behavioral changes.   Your condition gets worse.  MAKE SURE YOU:  Understand these instructions.   Will watch your condition.   Will get help right away if you are not doing well or get worse.  Document Released: 07/19/2005 Document Revised: 09/28/2011 Document Reviewed: 04/27/2011 [facility] Patient Information 2012 [place], [place].  Cholesterol Control Diet Cholesterol levels in your body are determined significantly by your diet. Cholesterol levels may also be related to heart disease. The following material helps to explain this relationship and discusses what you can do to help keep your heart healthy. Not all cholesterol is bad. Low-density lipoprotein (LDL) cholesterol is the "bad" cholesterol. It may cause fatty deposits to build up inside your arteries. High-density lipoprotein (HDL) cholesterol is "good." It helps to remove the "bad" LDL cholesterol from your blood. Cholesterol is a very important risk factor for heart disease. Other risk factors are high blood pressure, smoking, stress, heredity, and weight. The heart muscle gets its supply of blood through the coronary arteries. If your LDL cholesterol is high and your HDL cholesterol is low, you are at risk for having fatty deposits build up in your coronary arteries. This  leaves less room through which blood can flow. Without sufficient blood and oxygen, the heart muscle cannot function properly and you may feel chest pains (angina pectoris). When a coronary artery closes up entirely, a part of the heart muscle may die, causing a heart attack (myocardial infarction). CHECKING  CHOLESTEROL When your caregiver sends your blood to a lab to be analyzed for cholesterol, a complete lipid (fat) profile may be done. With this test, the total amount of cholesterol and levels of LDL and HDL are determined. Triglycerides are a type of fat that circulates in the blood and can also be used to determine heart disease risk. The list below describes what the numbers should be: Test: Total Cholesterol.  Less than 200 mg/dl.  Test: LDL "bad cholesterol."  Less than 100 mg/dl.   Less than 70 mg/dl if you are at very high risk of a heart attack or sudden cardiac death.  Test: HDL "good cholesterol."  Greater than 50 mg/dl for women.   Greater than 40 mg/dl for men.  Test: Triglycerides.  Less than 150 mg/dl.  CONTROLLING CHOLESTEROL WITH DIET Although exercise and lifestyle factors are important, your diet is key. That is because certain foods are known to raise cholesterol and others to lower it. The goal is to balance foods for their effect on cholesterol and more importantly, to replace saturated and trans fat with other types of fat, such as monounsaturated fat, polyunsaturated fat, and omega-3 fatty acids. On average, a person should consume no more than 15 to 17 g of saturated fat daily. Saturated and trans fats are considered "bad" fats, and they will raise LDL cholesterol. Saturated fats are primarily found in animal products such as meats, butter, and cream. However, that does not mean you need to sacrifice all your favorite foods. Today, there are good tasting, low-fat, low-cholesterol substitutes for most of the things you like to eat. Choose low-fat or nonfat alternatives. Choose round or loin cuts of red meat, since these types of cuts are lowest in fat and cholesterol. Chicken (without the skin), fish, veal, and ground [place] breast are excellent choices. Eliminate fatty meats, such as hot dogs and salami. Even shellfish have little or no saturated fat. Have a 3 oz (85 g)  portion when you eat lean meat, poultry, or fish. Trans fats are also called "partially hydrogenated oils." They are oils that have been scientifically manipulated so that they are solid at room temperature resulting in a longer shelf life and improved taste and texture of foods in which they are added. Trans fats are found in stick margarine, some tub margarines, cookies, crackers, and baked goods.  When baking and cooking, oils are an excellent substitute for butter. The monounsaturated oils are especially beneficial since it is believed they lower LDL and raise HDL. The oils you should avoid entirely are saturated tropical oils, such as coconut and palm.  Remember to eat liberally from food groups that are naturally free of saturated and trans fat, including fish, fruit, vegetables, beans, grains (barley, rice, couscous, bulgur wheat), and pasta (without cream sauces).  IDENTIFYING FOODS THAT LOWER CHOLESTEROL  Soluble fiber may lower your cholesterol. This type of fiber is found in fruits such as apples, vegetables such as broccoli, potatoes, and carrots, legumes such as beans, peas, and lentils, and grains such as barley. Foods fortified with plant sterols (phytosterol) may also lower cholesterol. You should eat at least 2 g per day of these foods for a cholesterol lowering effect.  Read package labels to identify low-saturated fats, trans fats free, and low-fat foods at the supermarket. Select cheeses that have only 2 to 3 g saturated fat per ounce. Use a heart-healthy tub margarine that is free of trans fats or partially hydrogenated oil. When buying baked goods (cookies, crackers), avoid partially hydrogenated oils. Breads and muffins should be made from whole grains (whole-wheat or whole oat flour, instead of "flour" or "enriched flour"). Buy non-creamy canned soups with reduced salt and no added fats.  FOOD PREPARATION TECHNIQUES  Never deep-fry. If you must fry, either stir-fry, which uses very  little fat, or use non-stick cooking sprays. When possible, broil, bake, or roast meats, and steam vegetables. Instead of dressing vegetables with butter or margarine, use lemon and herbs, applesauce and cinnamon (for squash and sweet potatoes), nonfat yogurt, salsa, and low-fat dressings for salads.  LOW-SATURATED FAT / LOW-FAT FOOD SUBSTITUTES Meats / Saturated Fat (g)  Avoid: Steak, marbled (3 oz/85 g) / 11 g   Choose: Steak, lean (3 oz/85 g) / 4 g   Avoid: Hamburger (3 oz/85 g) / 7 g   Choose: Hamburger, lean (3 oz/85 g) / 5 g   Avoid: Ham (3 oz/85 g) / 6 g   Choose: Ham, lean cut (3 oz/85 g) / 2.4 g   Avoid: Chicken, with skin, dark meat (3 oz/85 g) / 4 g   Choose: Chicken, skin removed, dark meat (3 oz/85 g) / 2 g   Avoid: Chicken, with skin, light meat (3 oz/85 g) / 2.5 g   Choose: Chicken, skin removed, light meat (3 oz/85 g) / 1 g  Dairy / Saturated Fat (g)  Avoid: Whole milk (1 cup) / 5 g   Choose: Low-fat milk, 2% (1 cup) / 3 g   Choose: Low-fat milk, 1% (1 cup) / 1.5 g   Choose: Skim milk (1 cup) / 0.3 g   Avoid: Hard cheese (1 oz/28 g) / 6 g   Choose: Skim milk cheese (1 oz/28 g) / 2 to 3 g   Avoid: Cottage cheese, 4% fat (1 cup) / 6.5 g   Choose: Low-fat cottage cheese, 1% fat (1 cup) / 1.5 g   Avoid: Ice cream (1 cup) / 9 g   Choose: Sherbet (1 cup) / 2.5 g   Choose: Nonfat frozen yogurt (1 cup) / 0.3 g   Choose: Frozen fruit bar / trace   Avoid: Whipped cream (1 tbs) / 3.5 g   Choose: Nondairy whipped topping (1 tbs) / 1 g  Condiments / Saturated Fat (g)  Avoid: Mayonnaise (1 tbs) / 2 g   Choose: Low-fat mayonnaise (1 tbs) / 1 g   Avoid: Butter (1 tbs) / 7 g   Choose: Extra light margarine (1 tbs) / 1 g   Avoid: Coconut oil (1 tbs) / 11.8 g   Choose: Olive oil (1 tbs) / 1.8 g   Choose: Corn oil (1 tbs) / 1.7 g   Choose: Safflower oil (1 tbs) / 1.2 g   Choose: Sunflower oil (1 tbs) / 1.4 g   Choose: Soybean oil (1 tbs) / 2.4 g    Choose: Canola oil (1 tbs) / 1 g  Document Released: 10/09/2005 Document Revised: 09/28/2011 Document Reviewed: 03/30/2011 [facility] Patient Information 2012 [place], [place].

## 2012-01-07 NOTE — Plan of Care (Signed)
Awaiting stroke work-up. Will follow-up on Monday. If there are any questions or concerns, call me.  Carmell Austria, MD

## 2012-01-07 NOTE — Progress Notes (Signed)
Subjective: Dizziness improved.  No CP/SOB.  No c/o RUE heaviness this morning.  Overall, feels much better than yesterday.  HA resolved yesterday with ibuprofen and very minor today (did not want to take medication)  Objective: Vital signs in last 24 hours: Filed Vitals:   01/07/12 0110 01/07/12 0300 01/07/12 0602 01/07/12 1118  BP: 148/77 151/81 138/89 137/91  Pulse: 63 62 62 68  Temp: 97.4 F (36.3 C) 98 F (36.7 C) 97.5 F (36.4 C) 98.4 F (36.9 C)  TempSrc: Oral Oral Oral Oral  Resp: 18 18 20 18   Height:      Weight:      SpO2: 93% 95% 95% 91%   Weight change:  No intake or output data in the 24 hours ending 01/07/12 1142  Physical Exam: Vital signs reviewed.  No evidence of orthostasis.  General: resting in bed HEENT: PERRL, EOMI, no scleral icterus Cardiac: RRR, no rubs, murmurs or gallops Pulm: clear to auscultation bilaterally, moving normal volumes of air Abd: soft, nontender, nondistended, BS present Ext: warm and well perfused, no pedal edema Neuro: alert and oriented X3, cranial nerves II-XII grossly intact, nonfocal   Lab Results: Basic Metabolic Panel:  Lab 01/07/12 1610 01/06/12 1745 01/06/12 1323  NA 141 -- 142  K 3.6 -- 3.8  CL 107 -- 103  CO2 23 -- --  GLUCOSE 139* -- 144*  BUN 12 -- 8  CREATININE 0.48* 0.43* --  CALCIUM 8.4 -- --  MG -- -- --  PHOS -- -- --   CBC:  Lab 01/07/12 0635 01/06/12 1745  WBC 6.1 8.7  NEUTROABS -- --  HGB 14.8 16.0  HCT 42.2 43.5  MCV 89.2 88.4  PLT 259 275   CBG:  Lab 01/06/12 1359  GLUCAP 141*   Hemoglobin A1C:  Lab 01/06/12 1745  HGBA1C 7.1*   Fasting Lipid Panel:  Lab 01/07/12 0635  CHOL 142  HDL 17*  LDLCALC 89  TRIG 960*  CHOLHDL 8.4  LDLDIRECT --   Studies/Results: Ct Head Wo Contrast  01/06/2012  *RADIOLOGY REPORT*  Clinical Data: Code stroke.  Right arm numbness.  CT HEAD WITHOUT CONTRAST  Technique:  Contiguous axial images were obtained from the base of the skull through the vertex  without contrast.  Comparison: CT head without contrast 05/02/2010 at [facility].  Findings: Remote lacunar infarcts are present on the right at the caudate head and anterior limb of the right internal capsule. Periventricular white matter changes are similar to the prior exam. No acute cortical infarct, hemorrhage, or mass lesion is evident. The ventricles are proportionate to the degree of atrophy.  The paranasal sinuses and mastoid air cells are clear.  The osseous skull is intact.  IMPRESSION:  1.  No acute intracranial abnormality or significant interval change. 2.  Stable lacunar infarcts of the right basal ganglia. 3.  Stable atrophy and white matter disease.  These results were called by telephone on 01/06/2012  at  01:30 p.m. to  Dr. Weldon Inches, who verbally acknowledged these results.  Original Report Authenticated By: Jamesetta Orleans. MATTERN, M.D.   Mr [name] Head/brain Wo Cm  01/07/2012  *RADIOLOGY REPORT*  Clinical Data:  TIA.  Dizziness and right arm pain.  MRI HEAD WITHOUT CONTRAST MRA HEAD WITHOUT CONTRAST  Technique:  Multiplanar, multiecho pulse sequences of the brain and surrounding structures were obtained without intravenous contrast. Angiographic images of the head were obtained using MRA technique without contrast.  Comparison:  CT head without contrast 01/05/2002.  MRI brain without contrast 07/16/2009 at [facility].  MRI HEAD  Findings:  The diffusion weighted images demonstrate no evidence for acute or subacute infarction.  Remote lacunar infarcts of the right basal ganglia are present.  Atrophy and periventricular white matter changes are somewhat advanced for age.  The ventricles are proportionate to the degree of atrophy.  Flow is present in the major intracranial arteries.  The globes and orbits are intact. The paranasal sinuses are clear.  There is some fluid in the mastoid air cells bilaterally.  No obstructing nasopharyngeal lesion is evident.  IMPRESSION:  1.  No  acute intracranial abnormality or significant interval change. 2.  Stable remote lacunar infarcts of the right basal ganglia. 3.  Atrophy white matter disease is somewhat advanced for age.  MRA HEAD  Findings:  The internal carotid arteries are within normal limits from high cervical segments through the ICA termini.  The A1 and M1 segments are normal.  The anterior communicating artery is patent.  The MCA bifurcations are within normal limits bilaterally.  Mild distal small vessel disease is evident.  The vertebral arteries are codominant.  The left PICA origin is visualized and within normal limits.  The right AICA is dominant. The basilar artery is within normal limits.  Both posterior cerebral arteries originate from the basilar tip.  There is minimal attenuation of distal PCA branch vessels.  IMPRESSION:  1.  Stable mild small vessel disease. 2.  No significant proximal stenosis, aneurysm, or branch vessel occlusion.  Original Report Authenticated By: Jamesetta Orleans. MATTERN, M.D.   Medications: I have reviewed the patient's current medications. Scheduled Meds:   . aspirin  325 mg Oral Daily  . heparin  5,000 Units Subcutaneous Q8H  . meclizine  25 mg Oral Once  . meclizine  25 mg Oral Once   Continuous Infusions:   . sodium chloride 100 mL/hr at 01/06/12 2317   PRN Meds:.ibuprofen Assessment/Plan:  #Dizziness : Improved today. Unlikely stroke/TIA given normal MRI/MRA.  Most likely 2/2 BPPV, especially given improvement of symptoms with meclizine.  No arrhythmias on tele.   -PT for vestibular rehab -continue meclizine   #h/o CAD: no CP; patient denies h/o stent placement, continue ASA 325   #h/o HTN: mildly elevated; monitor, consider starting antihypertensive outpatient  #VTE ppx: heparin TID  #Dispo: likely d/c home today with PCP f/u   LOS: 1 day   KAPADIA, Jemima Petko 01/07/2012, 11:42 AM

## 2013-06-04 ENCOUNTER — Emergency Department (HOSPITAL_COMMUNITY)
Admission: EM | Admit: 2013-06-04 | Discharge: 2013-06-04 | Disposition: A | Discharge status: Home / Self Care | Payer: BC Managed Care – PPO | Attending: Emergency Medicine | Admitting: Emergency Medicine

## 2013-06-04 ENCOUNTER — Encounter (HOSPITAL_COMMUNITY): Payer: Self-pay | Admitting: Emergency Medicine

## 2013-06-04 DIAGNOSIS — R739 Hyperglycemia, unspecified: Secondary | ICD-10-CM

## 2013-06-04 DIAGNOSIS — R109 Unspecified abdominal pain: Secondary | ICD-10-CM | POA: Insufficient documentation

## 2013-06-04 DIAGNOSIS — R6883 Chills (without fever): Secondary | ICD-10-CM | POA: Insufficient documentation

## 2013-06-04 DIAGNOSIS — Z8719 Personal history of other diseases of the digestive system: Secondary | ICD-10-CM | POA: Insufficient documentation

## 2013-06-04 DIAGNOSIS — R5381 Other malaise: Secondary | ICD-10-CM | POA: Insufficient documentation

## 2013-06-04 DIAGNOSIS — I2510 Atherosclerotic heart disease of native coronary artery without angina pectoris: Secondary | ICD-10-CM | POA: Insufficient documentation

## 2013-06-04 DIAGNOSIS — Z880 Allergy status to penicillin: Secondary | ICD-10-CM | POA: Insufficient documentation

## 2013-06-04 DIAGNOSIS — Z8679 Personal history of other diseases of the circulatory system: Secondary | ICD-10-CM | POA: Insufficient documentation

## 2013-06-04 DIAGNOSIS — E860 Dehydration: Secondary | ICD-10-CM | POA: Insufficient documentation

## 2013-06-04 DIAGNOSIS — I10 Essential (primary) hypertension: Secondary | ICD-10-CM | POA: Insufficient documentation

## 2013-06-04 DIAGNOSIS — Z8673 Personal history of transient ischemic attack (TIA), and cerebral infarction without residual deficits: Secondary | ICD-10-CM | POA: Insufficient documentation

## 2013-06-04 DIAGNOSIS — R51 Headache: Secondary | ICD-10-CM | POA: Insufficient documentation

## 2013-06-04 DIAGNOSIS — R7309 Other abnormal glucose: Secondary | ICD-10-CM | POA: Insufficient documentation

## 2013-06-04 DIAGNOSIS — R197 Diarrhea, unspecified: Secondary | ICD-10-CM | POA: Insufficient documentation

## 2013-06-04 DIAGNOSIS — R1110 Vomiting, unspecified: Secondary | ICD-10-CM | POA: Insufficient documentation

## 2013-06-04 DIAGNOSIS — Z7982 Long term (current) use of aspirin: Secondary | ICD-10-CM | POA: Insufficient documentation

## 2013-06-04 LAB — CBC WITH DIFFERENTIAL/PLATELET
Basophils Relative: 0 % (ref 0–1)
Eosinophils Absolute: 0 10*3/uL (ref 0.0–0.7)
MCH: 32.7 pg (ref 26.0–34.0)
MCHC: 37 g/dL — ABNORMAL HIGH (ref 30.0–36.0)
Monocytes Relative: 16 % — ABNORMAL HIGH (ref 3–12)
Neutrophils Relative %: 72 % (ref 43–77)
Platelets: 275 10*3/uL (ref 150–400)

## 2013-06-04 LAB — COMPREHENSIVE METABOLIC PANEL
Albumin: 3.8 g/dL (ref 3.5–5.2)
Alkaline Phosphatase: 120 U/L — ABNORMAL HIGH (ref 39–117)
BUN: 10 mg/dL (ref 6–23)
Potassium: 3.6 mEq/L (ref 3.5–5.1)
Total Protein: 7.8 g/dL (ref 6.0–8.3)

## 2013-06-04 LAB — URINALYSIS, ROUTINE W REFLEX MICROSCOPIC
Nitrite: POSITIVE — AB
Specific Gravity, Urine: 1.04 — ABNORMAL HIGH (ref 1.005–1.030)
pH: 5.5 (ref 5.0–8.0)

## 2013-06-04 LAB — URINE MICROSCOPIC-ADD ON

## 2013-06-04 LAB — LIPASE, BLOOD: Lipase: 25 U/L (ref 11–59)

## 2013-06-04 MED ORDER — ACETAMINOPHEN 325 MG PO TABS
650.0000 mg | ORAL_TABLET | Freq: Once | ORAL | Status: AC
  Administered 2013-06-04: 650 mg via ORAL
  Filled 2013-06-04: qty 2

## 2013-06-04 MED ORDER — ONDANSETRON HCL 4 MG/2ML IJ SOLN
4.0000 mg | Freq: Once | INTRAMUSCULAR | Status: AC
  Administered 2013-06-04: 4 mg via INTRAVENOUS
  Filled 2013-06-04: qty 2

## 2013-06-04 MED ORDER — SODIUM CHLORIDE 0.9 % IV BOLUS (SEPSIS): 1000.0000 mL | Freq: Once | INTRAVENOUS | Status: DC

## 2013-06-04 MED ORDER — SODIUM CHLORIDE 0.9 % IV BOLUS (SEPSIS)
1000.0000 mL | Freq: Once | INTRAVENOUS | Status: AC
  Administered 2013-06-04: 1000 mL via INTRAVENOUS

## 2013-06-04 NOTE — ED Notes (Signed)
Pt ambulated to end of hallway Pod D without dizziness, rubber feeling in his legs. Pt still complaining of HA. EDP aware.

## 2013-06-04 NOTE — ED Notes (Signed)
Pt drinking PO water, able to keep down without nausea

## 2013-06-04 NOTE — ED Provider Notes (Signed)
CSN: [id number]     Arrival date & time 06/04/13  1750 History     First MD Initiated Contact with Patient 06/04/13 1924     Chief Complaint  Patient presents with  . Emesis  . Abdominal Pain    Patient is a 62 y.o. male presenting with vomiting. The history is provided by the patient.  Emesis Severity:  Moderate Duration:  3 days Timing:  Intermittent Progression:  Worsening Chronicity:  New Relieved by:  Nothing Associated symptoms: abdominal pain, chills, diarrhea and headaches   Associated symptoms: no fever   pt presents for vomiting/diarrhea for 3 days He reports up to 4 episodes/day No blood in stool/vomitus He also reports abdominal pain He also reports chills/fatigue but no syncope No cp/sob No focal weakness  Past Medical History  Diagnosis Date  . SBO (small bowel obstruction) 09/2010    Resolved with conservative measures  . Lacunar infarction 2006/2007    Right basal ganglion, chronic lacunar infarct  . Hypertension     fluctuates  . CAD (coronary artery disease)     s/p MI in distant past (1995?); TTE 2010 - EF 55-60%  . Stenosis of right carotid artery   . TIA (transient ischemic attack) 06/2009  . Acute cerebral infarction 11/2005    right caudate internal capsule secondary to small vessel   Past Surgical History  Procedure Laterality Date  . Laparoscopic cholecystectomy  2009    by Dr. Derrell Lolling   Family History  Problem Relation Age of Onset  . Lung cancer Mother   . Emphysema Mother   . Heart failure Father    History  Substance Use Topics  . Smoking status: Never Smoker   . Smokeless tobacco: Never Used  . Alcohol Use: No    Review of Systems  Constitutional: Positive for chills and fatigue.  Respiratory: Negative for shortness of breath.   Cardiovascular: Negative for chest pain.  Gastrointestinal: Positive for vomiting, abdominal pain and diarrhea. Negative for blood in stool.  Neurological: Positive for headaches.  All other  systems reviewed and are negative.    Allergies  Bee venom and Penicillins  Home Medications   Current Outpatient Rx  Name  Route  Sig  Dispense  Refill  . acetaminophen (TYLENOL) 325 MG tablet   Oral   Take 650 mg by mouth every 6 (six) hours as needed (headache).         Marland Kitchen aspirin EC 81 MG tablet   Oral   Take 81 mg by mouth daily. For pain          BP 126/77  Pulse 95  Temp(Src) 98.6 F (37 C) (Oral)  Resp 33  SpO2 93% Physical Exam CONSTITUTIONAL: Well developed/well nourished HEAD: Normocephalic/atraumatic EYES: EOMI/PERRL, no icterus ENMT: Mucous membranes dry NECK: supple no meningeal signs SPINE:entire spine nontender CV: S1/S2 noted, no murmurs/rubs/gallops noted LUNGS: Lungs are clear to auscultation bilaterally, no apparent distress ABDOMEN: soft, nontender, no rebound or guarding GU:no cva tenderness NEURO: Pt is awake/alert, moves all extremitiesx4 EXTREMITIES: pulses normal, full ROM SKIN: warm, color normal PSYCH: no abnormalities of mood noted  ED Course   Procedures  Labs Reviewed  CBC WITH DIFFERENTIAL - Abnormal; Notable for the following:    MCHC 37.0 (*)    Monocytes Relative 16 (*)    Monocytes Absolute 1.6 (*)    All other components within normal limits  COMPREHENSIVE METABOLIC PANEL - Abnormal; Notable for the following:    Sodium 128 (*)  Chloride 92 (*)    Glucose, Bld 307 (*)    Alkaline Phosphatase 120 (*)    Total Bilirubin 1.4 (*)    All other components within normal limits  LIPASE, BLOOD  URINALYSIS, ROUTINE W REFLEX MICROSCOPIC   PT IMPROVED, AMBULATORY, NO DISTRESS, NO TACHYPNEA ON MY EVAL, WELL APPEARING HE DENIED COUGH/CP/SOB HIS ABDOMEN WAS SOFT ADVISED NEED FOR FOLLOWUP FOR HIS HYPERGLYCEMIA SUSPECT GASTROENTERITIS WITH DEHYDRATION HE AMBULATED AND FELT WELL   MDM  Nursing notes including past medical history and social history reviewed and considered in documentation Labs/vital reviewed and  considered     Date: 06/04/2013 1803  Rate: 105  Rhythm: sinus tachycardia  QRS Axis: normal  Intervals: normal  ST/T Wave abnormalities: nonspecific ST changes  Conduction Disutrbances:none  Narrative Interpretation:   Old EKG Reviewed: unchanged from 12/2011      Joya Gaskins, MD 06/04/13 2152

## 2013-06-04 NOTE — ED Notes (Signed)
Chills vomiting x 1 week  Diarrhea also  voiding

## 2013-06-04 NOTE — ED Notes (Signed)
Pt states that he has been having abdominal pain for the past three days. Pt states the pain is generalized and if he drinks ice cold water the pain goes away but if he smells food he starts to have nausea and vomiting. Pt states that he is having a HA as well for the past 3 days.

## 2013-06-06 LAB — URINE CULTURE

## 2013-06-07 ENCOUNTER — Inpatient Hospital Stay (HOSPITAL_COMMUNITY)
Admission: EM | Admit: 2013-06-07 | Discharge: 2013-06-12 | DRG: 180 | Disposition: A | Discharge status: Home / Self Care | Payer: BC Managed Care – PPO | Attending: Internal Medicine | Admitting: Internal Medicine

## 2013-06-07 ENCOUNTER — Encounter (HOSPITAL_COMMUNITY): Payer: Self-pay | Admitting: *Deleted

## 2013-06-07 ENCOUNTER — Emergency Department (HOSPITAL_COMMUNITY): Payer: BC Managed Care – PPO

## 2013-06-07 DIAGNOSIS — Z8673 Personal history of transient ischemic attack (TIA), and cerebral infarction without residual deficits: Secondary | ICD-10-CM

## 2013-06-07 DIAGNOSIS — I10 Essential (primary) hypertension: Secondary | ICD-10-CM | POA: Diagnosis present

## 2013-06-07 DIAGNOSIS — Z880 Allergy status to penicillin: Secondary | ICD-10-CM

## 2013-06-07 DIAGNOSIS — R739 Hyperglycemia, unspecified: Secondary | ICD-10-CM

## 2013-06-07 DIAGNOSIS — R1110 Vomiting, unspecified: Secondary | ICD-10-CM

## 2013-06-07 DIAGNOSIS — I252 Old myocardial infarction: Secondary | ICD-10-CM

## 2013-06-07 DIAGNOSIS — I2510 Atherosclerotic heart disease of native coronary artery without angina pectoris: Secondary | ICD-10-CM | POA: Diagnosis present

## 2013-06-07 DIAGNOSIS — K56600 Partial intestinal obstruction, unspecified as to cause: Secondary | ICD-10-CM

## 2013-06-07 DIAGNOSIS — E119 Type 2 diabetes mellitus without complications: Secondary | ICD-10-CM

## 2013-06-07 DIAGNOSIS — H538 Other visual disturbances: Secondary | ICD-10-CM | POA: Diagnosis not present

## 2013-06-07 DIAGNOSIS — K56609 Unspecified intestinal obstruction, unspecified as to partial versus complete obstruction: Secondary | ICD-10-CM

## 2013-06-07 DIAGNOSIS — H8110 Benign paroxysmal vertigo, unspecified ear: Secondary | ICD-10-CM

## 2013-06-07 DIAGNOSIS — E876 Hypokalemia: Secondary | ICD-10-CM | POA: Diagnosis present

## 2013-06-07 DIAGNOSIS — I6529 Occlusion and stenosis of unspecified carotid artery: Secondary | ICD-10-CM | POA: Diagnosis present

## 2013-06-07 DIAGNOSIS — Z9089 Acquired absence of other organs: Secondary | ICD-10-CM

## 2013-06-07 LAB — COMPREHENSIVE METABOLIC PANEL
Albumin: 3 g/dL — ABNORMAL LOW (ref 3.5–5.2)
BUN: 10 mg/dL (ref 6–23)
Chloride: 96 mEq/L (ref 96–112)
Creatinine, Ser: 0.5 mg/dL (ref 0.50–1.35)
GFR calc Af Amer: >90 mL/min (ref >90)
Total Bilirubin: 1.3 mg/dL — ABNORMAL HIGH (ref 0.3–1.2)
Total Protein: 6.6 g/dL (ref 6.0–8.3)

## 2013-06-07 LAB — CBC WITH DIFFERENTIAL/PLATELET
Basophils Absolute: 0 10*3/uL (ref 0.0–0.1)
HCT: 40.9 % (ref 39.0–52.0)
Lymphocytes Relative: 4 % — ABNORMAL LOW (ref 12–46)
Monocytes Absolute: 1.3 10*3/uL — ABNORMAL HIGH (ref 0.1–1.0)
Neutro Abs: 7.2 10*3/uL (ref 1.7–7.7)
Neutrophils Relative %: 81 % — ABNORMAL HIGH (ref 43–77)
RDW: 12.6 % (ref 11.5–15.5)
WBC: 9 10*3/uL (ref 4.0–10.5)

## 2013-06-07 LAB — LIPASE, BLOOD: Lipase: 17 U/L (ref 11–59)

## 2013-06-07 LAB — URINALYSIS, ROUTINE W REFLEX MICROSCOPIC
Hgb urine dipstick: NEGATIVE
Leukocytes, UA: NEGATIVE
Nitrite: NEGATIVE
Protein, ur: NEGATIVE mg/dL
Specific Gravity, Urine: 1.02 (ref 1.005–1.030)
Urobilinogen, UA: 1 mg/dL (ref 0.0–1.0)

## 2013-06-07 LAB — LACTIC ACID, PLASMA: Lactic Acid, Venous: 1.5 mmol/L (ref 0.5–2.2)

## 2013-06-07 MED ORDER — MORPHINE SULFATE 4 MG/ML IJ SOLN
4.0000 mg | Freq: Once | INTRAMUSCULAR | Status: AC
  Administered 2013-06-07: 4 mg via INTRAVENOUS
  Filled 2013-06-07: qty 1

## 2013-06-07 MED ORDER — SODIUM CHLORIDE 0.9 % IV BOLUS (SEPSIS)
1000.0000 mL | Freq: Once | INTRAVENOUS | Status: AC
  Administered 2013-06-07: 1000 mL via INTRAVENOUS

## 2013-06-07 MED ORDER — IOHEXOL 300 MG/ML  SOLN
100.0000 mL | Freq: Once | INTRAMUSCULAR | Status: AC | PRN
  Administered 2013-06-07: 100 mL via INTRAVENOUS

## 2013-06-07 MED ORDER — ONDANSETRON HCL 4 MG/2ML IJ SOLN
4.0000 mg | Freq: Once | INTRAMUSCULAR | Status: AC
  Administered 2013-06-07: 4 mg via INTRAVENOUS
  Filled 2013-06-07: qty 2

## 2013-06-07 MED ORDER — IOHEXOL 300 MG/ML  SOLN
20.0000 mL | INTRAMUSCULAR | Status: AC
  Administered 2013-06-07: 25 mL via ORAL

## 2013-06-07 NOTE — ED Notes (Addendum)
LBM 06/07/2013. Denies fever

## 2013-06-07 NOTE — ED Notes (Signed)
Horton MD at bedside.  

## 2013-06-07 NOTE — ED Provider Notes (Signed)
CSN: [id number]     Arrival date & time 06/07/13  2025 History     First MD Initiated Contact with Patient 06/07/13 2032     Chief Complaint  Patient presents with  . Abdominal Pain   (Consider location/radiation/quality/duration/timing/severity/associated sxs/prior Treatment) Patient is a 62 y.o. male presenting with abdominal pain. The history is provided by the patient and the spouse.  Abdominal Pain Pain location:  Generalized Pain quality: fullness and pressure   Pain radiates to:  Does not radiate Pain severity:  Moderate Onset quality:  Gradual Duration:  5 hours Timing:  Constant Progression:  Unchanged Chronicity:  New Relieved by:  Nothing Worsened by:  Eating and vomiting Ineffective treatments: nsaids. Associated symptoms: constipation and nausea   Associated symptoms: no chest pain, no cough, no diarrhea, no dysuria, no fatigue, no fever, no hematuria, no shortness of breath, no sore throat and no vomiting     Past Medical History  Diagnosis Date  . SBO (small bowel obstruction) 09/2010    Resolved with conservative measures  . Lacunar infarction 2006/2007    Right basal ganglion, chronic lacunar infarct  . Hypertension     fluctuates  . CAD (coronary artery disease)     s/p MI in distant past (1995?); TTE 2010 - EF 55-60%  . Stenosis of right carotid artery   . TIA (transient ischemic attack) 06/2009  . Acute cerebral infarction 11/2005    right caudate internal capsule secondary to small vessel   Past Surgical History  Procedure Laterality Date  . Laparoscopic cholecystectomy  2009    by Dr. Derrell Lolling   Family History  Problem Relation Age of Onset  . Lung cancer Mother   . Emphysema Mother   . Heart failure Father    History  Substance Use Topics  . Smoking status: Never Smoker   . Smokeless tobacco: Never Used  . Alcohol Use: No    Review of Systems  Constitutional: Negative for fever, activity change, appetite change and fatigue.  HENT:  Negative for congestion, sore throat, facial swelling, rhinorrhea, trouble swallowing, neck pain, neck stiffness, voice change and sinus pressure.   Eyes: Negative.   Respiratory: Negative for cough, choking, chest tightness, shortness of breath and wheezing.   Cardiovascular: Negative for chest pain.  Gastrointestinal: Positive for nausea, abdominal pain, constipation and abdominal distention. Negative for vomiting and diarrhea.  Genitourinary: Negative for dysuria, urgency, frequency, hematuria, flank pain and difficulty urinating.  Musculoskeletal: Negative for back pain and gait problem.  Skin: Negative for rash and wound.  Neurological: Negative for facial asymmetry, weakness, numbness and headaches.  Psychiatric/Behavioral: Negative for behavioral problems, confusion and agitation. The patient is not nervous/anxious and is not hyperactive.   All other systems reviewed and are negative.    Allergies  Bee venom and Penicillins  Home Medications   Current Outpatient Rx  Name  Route  Sig  Dispense  Refill  . aspirin EC 81 MG tablet   Oral   Take 81 mg by mouth daily. For pain          There were no vitals taken for this visit. Physical Exam  Nursing note and vitals reviewed. Constitutional: He is oriented to person, place, and time. He appears well-developed and well-nourished. No distress.  HENT:  Head: Normocephalic and atraumatic.  Right Ear: External ear normal.  Left Ear: External ear normal.  Mouth/Throat: No oropharyngeal exudate.  Eyes: Conjunctivae and EOM are normal. Pupils are equal, round, and reactive to light.  Right eye exhibits no discharge. Left eye exhibits no discharge.  Neck: Normal range of motion. Neck supple. No JVD present. No tracheal deviation present. No thyromegaly present.  Cardiovascular: Normal rate, regular rhythm, normal heart sounds and intact distal pulses.  Exam reveals no gallop and no friction rub.   No murmur heard. Pulmonary/Chest:  Effort normal and breath sounds normal. No respiratory distress. He has no wheezes. He exhibits no tenderness.  Abdominal: Soft. Bowel sounds are normal. He exhibits distension (tympanic abdomen). He exhibits no mass. There is tenderness (diffuse moderate TTP). There is no rebound and no guarding.  Musculoskeletal: Normal range of motion. He exhibits no edema and no tenderness.  Lymphadenopathy:    He has no cervical adenopathy.  Neurological: He is alert and oriented to person, place, and time. No cranial nerve deficit.  Skin: Skin is warm and dry. No rash noted. He is not diaphoretic. No pallor.  Psychiatric: He has a normal mood and affect. His behavior is normal.    ED Course   Procedures (including critical care time)  Labs Reviewed  COMPREHENSIVE METABOLIC PANEL - Abnormal; Notable for the following:    Sodium 134 (*)    Glucose, Bld 224 (*)    Calcium 8.1 (*)    Albumin 3.0 (*)    Total Bilirubin 1.3 (*)    All other components within normal limits  CBC WITH DIFFERENTIAL - Abnormal; Notable for the following:    MCHC 36.4 (*)    Neutrophils Relative % 81 (*)    Lymphocytes Relative 4 (*)    Lymphs Abs 0.4 (*)    Monocytes Relative 15 (*)    Monocytes Absolute 1.3 (*)    All other components within normal limits  URINALYSIS, ROUTINE W REFLEX MICROSCOPIC - Abnormal; Notable for the following:    Color, Urine AMBER (*)    APPearance CLOUDY (*)    Glucose, UA 250 (*)    Bilirubin Urine SMALL (*)    Ketones, ur >80 (*)    All other components within normal limits  LIPASE, BLOOD  LACTIC ACID, PLASMA   Ct Abdomen Pelvis W Contrast  06/08/2013   *RADIOLOGY REPORT*  Clinical Data: Abdominal pain  CT ABDOMEN AND PELVIS WITH CONTRAST  Technique:  Multidetector CT imaging of the abdomen and pelvis was performed following the standard protocol during bolus administration of intravenous contrast.  Contrast: OMNIPAQUE IOHEXOL 300 MG/ML  SOLN  Comparison: Most recent prior CT  abdomen/pelvis 09/23/2010  Findings:  Lower Chest:  Respiratory motion limits evaluation of the lung bases.  No focal airspace consolidation or definite pulmonary nodule.  Mild dependent atelectasis noted bilaterally.  Mild cardiomegaly.  No pericardial effusion.  Unremarkable visualized distal thoracic esophagus.  Abdomen: Unremarkable CT appearance of the stomach, duodenum, spleen, adrenal glands and pancreas.  Diffuse hypoattenuation of the hepatic parenchyma consistent with hepatic steatosis.  No discrete hepatic lesion identified.  Surgical changes of prior cholecystectomy.  No intra or extrahepatic biliary ductal dilatation.  Symmetric renal parenchymal enhancement bilaterally.  No hydronephrosis, nephrolithiasis or enhancing renal mass.  Unremarkable colon and terminal ileum.  The appendix is not identified and may be surgically absent.  No inflammatory change in the right lower quadrant.  Multiple loops of dilated and fluid- filled small bowel noted in the mid abdomen.  There is a focal lesion abnormal segment of the small bowel to the left of midline in the mid abdomen which demonstrates diffuse submucosal wall thickening and narrowing of the bowel  lumen.  Overall, findings are consistent with high-grade chronic partial small bowel obstruction. No free fluid, or free air.  Pelvis: Unremarkable bladder, prostate gland and seminal vesicles. No free fluid or suspicious adenopathy.  Bones: No acute fracture or aggressive appearing lytic or blastic osseous lesion.  Vascular: No acute vascular abnormality or significant atherosclerotic vascular disease.  IMPRESSION:  1.  CT findings are most consistent with high-grade and likely chronic partial small bowel obstruction secondary to a focally abnormal segment of jejunum which demonstrates submucosal thickening and stenosis of the bowel lumen.  Differential considerations include acute enteritis, or chronic post inflammatory stenosis.  Query clinical history of  Crohn disease or other inflammatory enteritis.  An infectious enteritis is also possible, but considered less likely given the similar appearance on the CT scan from December 2011.  2.  Cardiomegaly  3.  Hepatic steatosis  4.  Surgical changes of prior cholecystectomy and probable appendectomy   Original Report Authenticated By: Malachy Moan, M.D.   1. Partial small bowel obstruction     MDM  62 yr old M pt who says he has had CAD, HTN, TIA and SBO in the past presents with diffuse abdominal pain. Patient says he has only been passing some minimal watery stools for the past 5 days but no large BM's. Patient is saying that every time he attempts to eat he throws the food back up. Patient also says this feels like SBO's of the past. Patient with tympanic abdomen. Concern for possible SBO. Doubt mesenteric ischemia as lactate is normal. No murphy's sign so doubt cholecystitis, doubt appy. Given that it has been going on for 1 week failing outpatient treatment doubt gastritis.. Will get CT. Labs reassuring and no evidence of UTI.  Ct shows partial small bowel obstruction. Will place ng tube. Will admit to the hospital  Case discussed with Dr. Rob Bunting, MD 06/08/13 [phone]

## 2013-06-07 NOTE — ED Notes (Signed)
Pt arrived via [facility] with c/o abdominal over the past week with N/V/D. Has been seen for same symptoms with no relief.

## 2013-06-08 ENCOUNTER — Encounter (HOSPITAL_COMMUNITY): Payer: Self-pay | Admitting: *Deleted

## 2013-06-08 DIAGNOSIS — R7309 Other abnormal glucose: Secondary | ICD-10-CM

## 2013-06-08 DIAGNOSIS — K56609 Unspecified intestinal obstruction, unspecified as to partial versus complete obstruction: Principal | ICD-10-CM

## 2013-06-08 DIAGNOSIS — R1110 Vomiting, unspecified: Secondary | ICD-10-CM | POA: Diagnosis present

## 2013-06-08 LAB — BASIC METABOLIC PANEL
CO2: 27 mEq/L (ref 19–32)
Calcium: 8.3 mg/dL — ABNORMAL LOW (ref 8.4–10.5)
GFR calc non Af Amer: >90 mL/min (ref >90)
Potassium: 3.3 mEq/L — ABNORMAL LOW (ref 3.5–5.1)
Sodium: 134 mEq/L — ABNORMAL LOW (ref 135–145)

## 2013-06-08 LAB — HEMOGLOBIN A1C
Hgb A1c MFr Bld: 11.1 % — ABNORMAL HIGH (ref <5.7)
Mean Plasma Glucose: 272 mg/dL — ABNORMAL HIGH (ref <117)

## 2013-06-08 LAB — CBC
MCH: 32.1 pg (ref 26.0–34.0)
Platelets: 274 10*3/uL (ref 150–400)
RBC: 4.33 MIL/uL (ref 4.22–5.81)

## 2013-06-08 LAB — GLUCOSE, CAPILLARY: Glucose-Capillary: 229 mg/dL — ABNORMAL HIGH (ref 70–99)

## 2013-06-08 MED ORDER — SODIUM CHLORIDE 0.9 % IJ SOLN
3.0000 mL | Freq: Two times a day (BID) | INTRAMUSCULAR | Status: DC
  Administered 2013-06-08 – 2013-06-12 (×5): 3 mL via INTRAVENOUS

## 2013-06-08 MED ORDER — LIDOCAINE VISCOUS 2 % MT SOLN
15.0000 mL | Freq: Once | OROMUCOSAL | Status: AC
  Administered 2013-06-08: 15 mL via OROMUCOSAL
  Filled 2013-06-08: qty 15

## 2013-06-08 MED ORDER — KCL IN DEXTROSE-NACL 20-5-0.9 MEQ/L-%-% IV SOLN
INTRAVENOUS | Status: AC
  Administered 2013-06-08 – 2013-06-09 (×3): via INTRAVENOUS
  Filled 2013-06-08 (×5): qty 1000

## 2013-06-08 MED ORDER — MORPHINE SULFATE 2 MG/ML IJ SOLN
2.0000 mg | INTRAMUSCULAR | Status: DC | PRN
  Administered 2013-06-08 – 2013-06-10 (×10): 2 mg via INTRAVENOUS
  Filled 2013-06-08 (×10): qty 1

## 2013-06-08 MED ORDER — INSULIN ASPART 100 UNIT/ML ~~LOC~~ SOLN
0.0000 [IU] | SUBCUTANEOUS | Status: DC
  Administered 2013-06-08 (×2): 3 [IU] via SUBCUTANEOUS
  Administered 2013-06-08 (×2): 2 [IU] via SUBCUTANEOUS
  Administered 2013-06-08: 3 [IU] via SUBCUTANEOUS
  Administered 2013-06-09 – 2013-06-10 (×4): 2 [IU] via SUBCUTANEOUS
  Administered 2013-06-10: 3 [IU] via SUBCUTANEOUS
  Administered 2013-06-11: 2 [IU] via SUBCUTANEOUS
  Administered 2013-06-11: 1 [IU] via SUBCUTANEOUS
  Administered 2013-06-11: 3 [IU] via SUBCUTANEOUS
  Administered 2013-06-11: 2 [IU] via SUBCUTANEOUS
  Administered 2013-06-11: 1 [IU] via SUBCUTANEOUS
  Administered 2013-06-11 – 2013-06-12 (×2): 3 [IU] via SUBCUTANEOUS
  Administered 2013-06-12: 2 [IU] via SUBCUTANEOUS
  Administered 2013-06-12: 3 [IU] via SUBCUTANEOUS
  Administered 2013-06-12: 2 [IU] via SUBCUTANEOUS
  Administered 2013-06-12: 5 [IU] via SUBCUTANEOUS

## 2013-06-08 MED ORDER — OXYMETAZOLINE HCL 0.05 % NA SOLN
1.0000 | Freq: Once | NASAL | Status: AC
  Administered 2013-06-08: 1 via NASAL
  Filled 2013-06-08: qty 15

## 2013-06-08 MED ORDER — ENOXAPARIN SODIUM 40 MG/0.4ML ~~LOC~~ SOLN
40.0000 mg | SUBCUTANEOUS | Status: DC
  Administered 2013-06-08 – 2013-06-12 (×4): 40 mg via SUBCUTANEOUS
  Filled 2013-06-08 (×5): qty 0.4

## 2013-06-08 NOTE — Progress Notes (Signed)
[facility] PROGRESS NOTE  [name] WGN:[id number] DOB: May 25, 1951 DOA: 06/07/2013 PCP: No PCP Per Patient  Assessment/Plan: 1. Chronic small bowel obstruction- CT scan shows jejunal inflammation. Will get GI consult. 2. Diabetes mellitus- continue with SSI 3. Hypokalemia- Replace the potassium. 4. DVT prophylaxis- lovenox  Code Status: Full Family Communication: *No family at bedside Disposition Plan:  Home when stable   Consultants:  GI  Procedures:  None  Antibiotics:  *None  HPI/Subjective: Patient seen and examined, has NG tube in place. Low intermittent suction.  Objective: Filed Vitals:   06/08/13 0613  BP: 101/70  Pulse: 86  Temp: 99.2 F (37.3 C)  Resp: 20    Intake/Output Summary (Last 24 hours) at 06/08/13 1239 Last data filed at 06/08/13 0500  Gross per 24 hour  Intake 1081.67 ml  Output    125 ml  Net 956.67 ml   Filed Weights   06/08/13 0341 06/08/13 0356  Weight: 67.314 kg (148 lb 6.4 oz) 67.314 kg (148 lb 6.4 oz)    Exam:   General:  Appear in no acute distress  Cardiovascular: S1s2 RRR  Respiratory: Clear bilaterally  Abdomen: Soft, distended, NG tube in place  Musculoskeletal: No edema  Data Reviewed: Basic Metabolic Panel:  Recent Labs Lab 06/04/13 1756 06/07/13 2155 06/08/13 0630  NA 128* 134* 134*  K 3.6 3.5 3.3*  CL 92* 96 98  CO2 25 26 27   GLUCOSE 307* 224* 238*  BUN 10 10 9   CREATININE 0.58 0.50 0.49*  CALCIUM 9.0 8.1* 8.3*   Liver Function Tests:  Recent Labs Lab 06/04/13 1756 06/07/13 2155  AST 27 30  ALT 46 37  ALKPHOS 120* 88  BILITOT 1.4* 1.3*  PROT 7.8 6.6  ALBUMIN 3.8 3.0*    Recent Labs Lab 06/04/13 1756 06/07/13 2155  LIPASE 25 17   No results found for this basename: AMMONIA,  in the last 168 hours CBC:  Recent Labs Lab 06/04/13 1756 06/07/13 2155 06/08/13 0630  WBC 10.0 9.0 5.9  NEUTROABS 7.2 7.2  --   HGB 16.3 14.9 13.9  HCT 44.1 40.9 38.3*  MCV 88.4 88.1  88.5  PLT 275 285 274   Cardiac Enzymes: No results found for this basename: CKTOTAL, CKMB, CKMBINDEX, TROPONINI,  in the last 168 hours BNP (last 3 results) No results found for this basename: PROBNP,  in the last 8760 hours CBG:  Recent Labs Lab 06/08/13 0359 06/08/13 0740 06/08/13 1149  GLUCAP 229* 220* 207*    Recent Results (from the past 240 hour(s))  URINE CULTURE     Status: None   Collection Time    06/04/13  7:20 PM      Result Value Range Status   Specimen Description URINE, RANDOM   Final   Special Requests NONE   Final   Culture  Setup Time     Final   Value: 06/04/2013 23:15     Performed at [facility] Count     Final   Value: NO GROWTH     Performed at [facility]   Culture     Final   Value: NO GROWTH     Performed at [facility]   Report Status 06/06/2013 FINAL   Final     Studies: Ct Abdomen Pelvis W Contrast  06/08/2013   *RADIOLOGY REPORT*  Clinical Data: Abdominal pain  CT ABDOMEN AND PELVIS WITH CONTRAST  Technique:  Multidetector CT imaging of the abdomen  and pelvis was performed following the standard protocol during bolus administration of intravenous contrast.  Contrast: OMNIPAQUE IOHEXOL 300 MG/ML  SOLN  Comparison: Most recent prior CT abdomen/pelvis 09/23/2010  Findings:  Lower Chest:  Respiratory motion limits evaluation of the lung bases.  No focal airspace consolidation or definite pulmonary nodule.  Mild dependent atelectasis noted bilaterally.  Mild cardiomegaly.  No pericardial effusion.  Unremarkable visualized distal thoracic esophagus.  Abdomen: Unremarkable CT appearance of the stomach, duodenum, spleen, adrenal glands and pancreas.  Diffuse hypoattenuation of the hepatic parenchyma consistent with hepatic steatosis.  No discrete hepatic lesion identified.  Surgical changes of prior cholecystectomy.  No intra or extrahepatic biliary ductal dilatation.  Symmetric renal parenchymal enhancement  bilaterally.  No hydronephrosis, nephrolithiasis or enhancing renal mass.  Unremarkable colon and terminal ileum.  The appendix is not identified and may be surgically absent.  No inflammatory change in the right lower quadrant.  Multiple loops of dilated and fluid- filled small bowel noted in the mid abdomen.  There is a focal lesion abnormal segment of the small bowel to the left of midline in the mid abdomen which demonstrates diffuse submucosal wall thickening and narrowing of the bowel lumen.  Overall, findings are consistent with high-grade chronic partial small bowel obstruction. No free fluid, or free air.  Pelvis: Unremarkable bladder, prostate gland and seminal vesicles. No free fluid or suspicious adenopathy.  Bones: No acute fracture or aggressive appearing lytic or blastic osseous lesion.  Vascular: No acute vascular abnormality or significant atherosclerotic vascular disease.  IMPRESSION:  1.  CT findings are most consistent with high-grade and likely chronic partial small bowel obstruction secondary to a focally abnormal segment of jejunum which demonstrates submucosal thickening and stenosis of the bowel lumen.  Differential considerations include acute enteritis, or chronic post inflammatory stenosis.  Query clinical history of Crohn disease or other inflammatory enteritis.  An infectious enteritis is also possible, but considered less likely given the similar appearance on the CT scan from December 2011.  2.  Cardiomegaly  3.  Hepatic steatosis  4.  Surgical changes of prior cholecystectomy and probable appendectomy   Original Report Authenticated By: Malachy Moan, M.D.    Scheduled Meds: . enoxaparin (LOVENOX) injection  40 mg Subcutaneous Q24H  . insulin aspart  0-9 Units Subcutaneous Q4H  . sodium chloride  3 mL Intravenous Q12H   Continuous Infusions: . dextrose 5 % and 0.9 % NaCl with KCl 20 mEq/L 100 mL/hr at 06/08/13 0411    Principal Problem:   Small bowel  obstruction Active Problems:   Vomiting    Time spent: 25 min    [facility]  [facility] Pager [phone]*. If 7PM-7AM, please contact night-coverage at [email or web address], password [facility] 06/08/2013, 12:39 PM  LOS: 1 day

## 2013-06-08 NOTE — H&P (Signed)
[facility] ADMISSIONS H&P  Chief Complaint: Abdominal Pain  HPI: 62 yr. Old WM w/ hx SBO (resolved with conservative therapy years ago), hx CVA, HTN, CAD, likely underlying DM, presents with abdominal pain. He states he had gradual onset of abdominal pain this past Monday. He states he then began to have repeated episodes of N/V/D that have only worsened. He states he came to the ED three days ago and was sent some and told to increase his PO intake. He states his abdomen has been increasingly distended and he has not been able to keep any food or liquids down. He denies fever, chills or any recent sick contacts.  He has a hx of a lap chole years ago which he states occurred due to cholecystitis with rupture. CT of the abdomen in the ED is notable for a high grade, possibly chronic small bowel obstruction due to a focally abnormal segment of jejunum with submucosal thickening.   I have placed an order for an NG tube that is pending.  Past Medical History  Diagnosis Date  . SBO (small bowel obstruction) 09/2010    Resolved with conservative measures  . Lacunar infarction 2006/2007    Right basal ganglion, chronic lacunar infarct  . Hypertension     fluctuates  . CAD (coronary artery disease)     s/p MI in distant past (1995?); TTE 2010 - EF 55-60%  . Stenosis of right carotid artery   . TIA (transient ischemic attack) 06/2009  . Acute cerebral infarction 11/2005    right caudate internal capsule secondary to small vessel    Past Surgical History  Procedure Laterality Date  . Laparoscopic cholecystectomy  2009    by Dr. Derrell Lolling    Family History  Problem Relation Age of Onset  . Lung cancer Mother   . Emphysema Mother   . Heart failure Father    Social History:  reports that he has never smoked. He has never used smokeless tobacco. He reports that he does not drink alcohol or use illicit drugs.  Allergies:  Allergies  Allergen Reactions  . Bee Venom Anaphylaxis  .  Penicillins Anaphylaxis     (Not in a hospital admission)  Results for orders placed during the hospital encounter of 06/07/13 (from the past 48 hour(s))  URINALYSIS, ROUTINE W REFLEX MICROSCOPIC     Status: Abnormal   Collection Time    06/07/13  9:49 PM      Result Value Range   Color, Urine AMBER (*) YELLOW   Comment: BIOCHEMICALS MAY BE AFFECTED BY COLOR   APPearance CLOUDY (*) CLEAR   Specific Gravity, Urine 1.020  1.005 - 1.030   pH 6.0  5.0 - 8.0   Glucose, UA 250 (*) NEGATIVE mg/dL   Hgb urine dipstick NEGATIVE  NEGATIVE   Bilirubin Urine SMALL (*) NEGATIVE   Ketones, ur >80 (*) NEGATIVE mg/dL   Protein, ur NEGATIVE  NEGATIVE mg/dL   Urobilinogen, UA 1.0  0.0 - 1.0 mg/dL   Nitrite NEGATIVE  NEGATIVE   Leukocytes, UA NEGATIVE  NEGATIVE   Comment: MICROSCOPIC NOT DONE ON URINES WITH NEGATIVE PROTEIN, BLOOD, LEUKOCYTES, NITRITE, OR GLUCOSE <1000 mg/dL.  COMPREHENSIVE METABOLIC PANEL     Status: Abnormal   Collection Time    06/07/13  9:55 PM      Result Value Range   Sodium 134 (*) 135 - 145 mEq/L   Potassium 3.5  3.5 - 5.1 mEq/L   Chloride 96  96 - 112 mEq/L  CO2 26  19 - 32 mEq/L   Glucose, Bld 224 (*) 70 - 99 mg/dL   BUN 10  6 - 23 mg/dL   Creatinine, Ser 1.61  0.50 - 1.35 mg/dL   Calcium 8.1 (*) 8.4 - 10.5 mg/dL   Total Protein 6.6  6.0 - 8.3 g/dL   Albumin 3.0 (*) 3.5 - 5.2 g/dL   AST 30  0 - 37 U/L   ALT 37  0 - 53 U/L   Alkaline Phosphatase 88  39 - 117 U/L   Total Bilirubin 1.3 (*) 0.3 - 1.2 mg/dL   GFR calc non Af Amer >90  >90 mL/min   GFR calc Af Amer >90  >90 mL/min   Comment: (NOTE)     The eGFR has been calculated using the CKD EPI equation.     This calculation has not been validated in all clinical situations.     eGFR's persistently <90 mL/min signify possible Chronic Kidney     Disease.  LIPASE, BLOOD     Status: None   Collection Time    06/07/13  9:55 PM      Result Value Range   Lipase 17  11 - 59 U/L  CBC WITH DIFFERENTIAL     Status:  Abnormal   Collection Time    06/07/13  9:55 PM      Result Value Range   WBC 9.0  4.0 - 10.5 K/uL   RBC 4.64  4.22 - 5.81 MIL/uL   Hemoglobin 14.9  13.0 - 17.0 g/dL   HCT 09.6  04.5 - 40.9 %   MCV 88.1  78.0 - 100.0 fL   MCH 32.1  26.0 - 34.0 pg   MCHC 36.4 (*) 30.0 - 36.0 g/dL   RDW 81.1  91.4 - 78.2 %   Platelets 285  150 - 400 K/uL   Neutrophils Relative % 81 (*) 43 - 77 %   Neutro Abs 7.2  1.7 - 7.7 K/uL   Lymphocytes Relative 4 (*) 12 - 46 %   Lymphs Abs 0.4 (*) 0.7 - 4.0 K/uL   Monocytes Relative 15 (*) 3 - 12 %   Monocytes Absolute 1.3 (*) 0.1 - 1.0 K/uL   Eosinophils Relative 0  0 - 5 %   Eosinophils Absolute 0.0  0.0 - 0.7 K/uL   Basophils Relative 0  0 - 1 %   Basophils Absolute 0.0  0.0 - 0.1 K/uL  LACTIC ACID, PLASMA     Status: None   Collection Time    06/07/13  9:55 PM      Result Value Range   Lactic Acid, Venous 1.5  0.5 - 2.2 mmol/L   Ct Abdomen Pelvis W Contrast  06/08/2013   *RADIOLOGY REPORT*  Clinical Data: Abdominal pain  CT ABDOMEN AND PELVIS WITH CONTRAST  Technique:  Multidetector CT imaging of the abdomen and pelvis was performed following the standard protocol during bolus administration of intravenous contrast.  Contrast: OMNIPAQUE IOHEXOL 300 MG/ML  SOLN  Comparison: Most recent prior CT abdomen/pelvis 09/23/2010  Findings:  Lower Chest:  Respiratory motion limits evaluation of the lung bases.  No focal airspace consolidation or definite pulmonary nodule.  Mild dependent atelectasis noted bilaterally.  Mild cardiomegaly.  No pericardial effusion.  Unremarkable visualized distal thoracic esophagus.  Abdomen: Unremarkable CT appearance of the stomach, duodenum, spleen, adrenal glands and pancreas.  Diffuse hypoattenuation of the hepatic parenchyma consistent with hepatic steatosis.  No discrete hepatic lesion identified.  Surgical changes of prior cholecystectomy.  No intra or extrahepatic biliary ductal dilatation.  Symmetric renal parenchymal  enhancement bilaterally.  No hydronephrosis, nephrolithiasis or enhancing renal mass.  Unremarkable colon and terminal ileum.  The appendix is not identified and may be surgically absent.  No inflammatory change in the right lower quadrant.  Multiple loops of dilated and fluid- filled small bowel noted in the mid abdomen.  There is a focal lesion abnormal segment of the small bowel to the left of midline in the mid abdomen which demonstrates diffuse submucosal wall thickening and narrowing of the bowel lumen.  Overall, findings are consistent with high-grade chronic partial small bowel obstruction. No free fluid, or free air.  Pelvis: Unremarkable bladder, prostate gland and seminal vesicles. No free fluid or suspicious adenopathy.  Bones: No acute fracture or aggressive appearing lytic or blastic osseous lesion.  Vascular: No acute vascular abnormality or significant atherosclerotic vascular disease.  IMPRESSION:  1.  CT findings are most consistent with high-grade and likely chronic partial small bowel obstruction secondary to a focally abnormal segment of jejunum which demonstrates submucosal thickening and stenosis of the bowel lumen.  Differential considerations include acute enteritis, or chronic post inflammatory stenosis.  Query clinical history of Crohn disease or other inflammatory enteritis.  An infectious enteritis is also possible, but considered less likely given the similar appearance on the CT scan from December 2011.  2.  Cardiomegaly  3.  Hepatic steatosis  4.  Surgical changes of prior cholecystectomy and probable appendectomy   Original Report Authenticated By: Malachy Moan, M.D.    Review of Systems  Constitutional: Positive for malaise/fatigue. Negative for fever and chills.  Eyes: Negative for blurred vision and photophobia.  Respiratory: Negative for cough, sputum production, shortness of breath, wheezing and stridor.   Cardiovascular: Negative for chest pain, palpitations,  orthopnea, leg swelling and PND.  Gastrointestinal: Positive for nausea, vomiting, abdominal pain and diarrhea. Negative for blood in stool and melena.  Genitourinary: Negative for dysuria and urgency.  Musculoskeletal: Negative for myalgias.  Neurological: Negative for weakness and headaches.  Psychiatric/Behavioral: Negative for depression.    There were no vitals taken for this visit. Physical Exam  Constitutional: He is oriented to person, place, and time. He appears well-developed and well-nourished. No distress.  HENT:  Head: Normocephalic and atraumatic.  Eyes: Conjunctivae and EOM are normal. Pupils are equal, round, and reactive to light.  Neck: No JVD present. No thyromegaly present.  Cardiovascular: Normal rate, regular rhythm and normal heart sounds.  Exam reveals no friction rub.   No murmur heard. Respiratory: Effort normal and breath sounds normal. No respiratory distress. He has no wheezes. He exhibits no tenderness.  GI: He exhibits distension. Bowel sounds are increased. There is generalized tenderness. There is no rebound and no guarding.  Musculoskeletal: Normal range of motion. He exhibits no edema.  Neurological: He is alert and oriented to person, place, and time. No cranial nerve deficit.  Skin: Skin is warm. He is not diaphoretic.  Psychiatric: He has a normal mood and affect.     Assessment/Plan 62 yr. Old WM w/ hx SBO (resolved with conservative therapy years ago), hx CVA, HTN, CAD, likely underlying DM, presents with abdominal pain and found to have a high grade SBO. 1) SBO: NGT to be placed at this time to low-intermittent suction. Morphine for pain. He will be placed on IVF with D5NS. The ED has spoken to Dr. Lindie Spruce in the ED who recommended conservative therapy. If he  does not improve, he will need formal GS consult. He does not have a hx of IBD, but this can be further investigated as outpatient. He likely has underlying adhesions due to previous abdominal  surgery. 2) HTN: He is not on any meds. BP will be followed. 3) Hyperglycemia: ISS. Likely underlying DM. Check A1C. 4) Proph: Lovenox. 5) FEN: NPO. IVF. NGT. 6) Code: FULL  [name] 06/08/2013, 1:39 AM

## 2013-06-08 NOTE — ED Provider Notes (Signed)
I saw and evaluated the patient, reviewed the resident's note and I agree with the findings and plan.  Patient presents with abdominal pain, nausea and liquidy stools.  Hx of SBO.  Nontoxic but tense, tender abdomen on exam.  CT shows possible partial SBO.  Patient to be admitted for medical management.  Shon Baton, MD 06/08/13 [phone]

## 2013-06-08 NOTE — Consult Note (Signed)
[facility] CONSULT Reason for consult: SBO Referring Physician: [facility]. PCP: none  [name] is an 62 y.o. male.  HPI: patient was hospitalized about 3 years ago with a small bowel obstruction that resolved. This was in the proximal jejunum and was felt to possibly be from effusions from previous cholecystectomy. Since hospitalization 2011 is done well with no abdominal pain, nausea and vomiting or other symptoms. 6 days ago he began have cramping and diarrhea with bloating and nausea and vomiting of gastric contents. He came to the emergency room about 3 days ago was felt to have an infectious gastroenteritis and treated as such. This symptoms would come and go but became worse over the past couple days resulting in another trip to the emergency room. CT scan of the abdomen showed proximal jejunum obstruction that was high-grade and was felt to be acute based on chronic. The patient has had no fever chills with melena. He notes his continued to have loose stools up until yesterday after the NG tube was placed. Since is that no further diarrhea. NG tube is draining a large amount of gastric material and he is still somewhat distend. He is not passed air since yesterday. Labs revealed potassium 3.3 and normal WBC.  Past Medical History  Diagnosis Date  . SBO (small bowel obstruction) 09/2010    Resolved with conservative measures  . Lacunar infarction 2006/2007    Right basal ganglion, chronic lacunar infarct  . Hypertension     fluctuates  . CAD (coronary artery disease)     s/p MI in distant past (1995?); TTE 2010 - EF 55-60%  . Stenosis of right carotid artery   . TIA (transient ischemic attack) 06/2009  . Acute cerebral infarction 11/2005    right caudate internal capsule secondary to small vessel    Past Surgical History  Procedure Laterality Date  . Laparoscopic cholecystectomy  2009    by Dr. Derrell Lolling  . Broken collarbone    . Broken shoulder      Family  History  Problem Relation Age of Onset  . Lung cancer Mother   . Emphysema Mother   . Heart failure Father     Social History:  reports that he has never smoked. He has never used smokeless tobacco. He reports that he does not drink alcohol or use illicit drugs.  Allergies:  Allergies  Allergen Reactions  . Bee Venom Anaphylaxis  . Penicillins Anaphylaxis    Medications; . enoxaparin (LOVENOX) injection  40 mg Subcutaneous Q24H  . insulin aspart  0-9 Units Subcutaneous Q4H  . sodium chloride  3 mL Intravenous Q12H   PRN Meds morphine injection Results for orders placed during the hospital encounter of 06/07/13 (from the past 48 hour(s))  URINALYSIS, ROUTINE W REFLEX MICROSCOPIC     Status: Abnormal   Collection Time    06/07/13  9:49 PM      Result Value Range   Color, Urine AMBER (*) YELLOW   Comment: BIOCHEMICALS MAY BE AFFECTED BY COLOR   APPearance CLOUDY (*) CLEAR   Specific Gravity, Urine 1.020  1.005 - 1.030   pH 6.0  5.0 - 8.0   Glucose, UA 250 (*) NEGATIVE mg/dL   Hgb urine dipstick NEGATIVE  NEGATIVE   Bilirubin Urine SMALL (*) NEGATIVE   Ketones, ur >80 (*) NEGATIVE mg/dL   Protein, ur NEGATIVE  NEGATIVE mg/dL   Urobilinogen, UA 1.0  0.0 - 1.0 mg/dL   Nitrite NEGATIVE  NEGATIVE  Leukocytes, UA NEGATIVE  NEGATIVE   Comment: MICROSCOPIC NOT DONE ON URINES WITH NEGATIVE PROTEIN, BLOOD, LEUKOCYTES, NITRITE, OR GLUCOSE <1000 mg/dL.  COMPREHENSIVE METABOLIC PANEL     Status: Abnormal   Collection Time    06/07/13  9:55 PM      Result Value Range   Sodium 134 (*) 135 - 145 mEq/L   Potassium 3.5  3.5 - 5.1 mEq/L   Chloride 96  96 - 112 mEq/L   CO2 26  19 - 32 mEq/L   Glucose, Bld 224 (*) 70 - 99 mg/dL   BUN 10  6 - 23 mg/dL   Creatinine, Ser 7.82  0.50 - 1.35 mg/dL   Calcium 8.1 (*) 8.4 - 10.5 mg/dL   Total Protein 6.6  6.0 - 8.3 g/dL   Albumin 3.0 (*) 3.5 - 5.2 g/dL   AST 30  0 - 37 U/L   ALT 37  0 - 53 U/L   Alkaline Phosphatase 88  39 - 117 U/L    Total Bilirubin 1.3 (*) 0.3 - 1.2 mg/dL   GFR calc non Af Amer >90  >90 mL/min   GFR calc Af Amer >90  >90 mL/min   Comment: (NOTE)     The eGFR has been calculated using the CKD EPI equation.     This calculation has not been validated in all clinical situations.     eGFR's persistently <90 mL/min signify possible Chronic Kidney     Disease.  LIPASE, BLOOD     Status: None   Collection Time    06/07/13  9:55 PM      Result Value Range   Lipase 17  11 - 59 U/L  CBC WITH DIFFERENTIAL     Status: Abnormal   Collection Time    06/07/13  9:55 PM      Result Value Range   WBC 9.0  4.0 - 10.5 K/uL   RBC 4.64  4.22 - 5.81 MIL/uL   Hemoglobin 14.9  13.0 - 17.0 g/dL   HCT 95.6  21.3 - 08.6 %   MCV 88.1  78.0 - 100.0 fL   MCH 32.1  26.0 - 34.0 pg   MCHC 36.4 (*) 30.0 - 36.0 g/dL   RDW 57.8  46.9 - 62.9 %   Platelets 285  150 - 400 K/uL   Neutrophils Relative % 81 (*) 43 - 77 %   Neutro Abs 7.2  1.7 - 7.7 K/uL   Lymphocytes Relative 4 (*) 12 - 46 %   Lymphs Abs 0.4 (*) 0.7 - 4.0 K/uL   Monocytes Relative 15 (*) 3 - 12 %   Monocytes Absolute 1.3 (*) 0.1 - 1.0 K/uL   Eosinophils Relative 0  0 - 5 %   Eosinophils Absolute 0.0  0.0 - 0.7 K/uL   Basophils Relative 0  0 - 1 %   Basophils Absolute 0.0  0.0 - 0.1 K/uL  LACTIC ACID, PLASMA     Status: None   Collection Time    06/07/13  9:55 PM      Result Value Range   Lactic Acid, Venous 1.5  0.5 - 2.2 mmol/L  GLUCOSE, CAPILLARY     Status: Abnormal   Collection Time    06/08/13  3:59 AM      Result Value Range   Glucose-Capillary 229 (*) 70 - 99 mg/dL  CBC     Status: Abnormal   Collection Time    06/08/13  6:30 AM  Result Value Range   WBC 5.9  4.0 - 10.5 K/uL   RBC 4.33  4.22 - 5.81 MIL/uL   Hemoglobin 13.9  13.0 - 17.0 g/dL   HCT 16.1 (*) 09.6 - 04.5 %   MCV 88.5  78.0 - 100.0 fL   MCH 32.1  26.0 - 34.0 pg   MCHC 36.3 (*) 30.0 - 36.0 g/dL   RDW 40.9  81.1 - 91.4 %   Platelets 274  150 - 400 K/uL  BASIC METABOLIC PANEL      Status: Abnormal   Collection Time    06/08/13  6:30 AM      Result Value Range   Sodium 134 (*) 135 - 145 mEq/L   Potassium 3.3 (*) 3.5 - 5.1 mEq/L   Chloride 98  96 - 112 mEq/L   CO2 27  19 - 32 mEq/L   Glucose, Bld 238 (*) 70 - 99 mg/dL   BUN 9  6 - 23 mg/dL   Creatinine, Ser 7.82 (*) 0.50 - 1.35 mg/dL   Calcium 8.3 (*) 8.4 - 10.5 mg/dL   GFR calc non Af Amer >90  >90 mL/min   GFR calc Af Amer >90  >90 mL/min   Comment: (NOTE)     The eGFR has been calculated using the CKD EPI equation.     This calculation has not been validated in all clinical situations.     eGFR's persistently <90 mL/min signify possible Chronic Kidney     Disease.  GLUCOSE, CAPILLARY     Status: Abnormal   Collection Time    06/08/13  7:40 AM      Result Value Range   Glucose-Capillary 220 (*) 70 - 99 mg/dL  GLUCOSE, CAPILLARY     Status: Abnormal   Collection Time    06/08/13 11:49 AM      Result Value Range   Glucose-Capillary 207 (*) 70 - 99 mg/dL    Ct Abdomen Pelvis W Contrast  06/08/2013   *RADIOLOGY REPORT*  Clinical Data: Abdominal pain  CT ABDOMEN AND PELVIS WITH CONTRAST  Technique:  Multidetector CT imaging of the abdomen and pelvis was performed following the standard protocol during bolus administration of intravenous contrast.  Contrast: OMNIPAQUE IOHEXOL 300 MG/ML  SOLN  Comparison: Most recent prior CT abdomen/pelvis 09/23/2010  Findings:  Lower Chest:  Respiratory motion limits evaluation of the lung bases.  No focal airspace consolidation or definite pulmonary nodule.  Mild dependent atelectasis noted bilaterally.  Mild cardiomegaly.  No pericardial effusion.  Unremarkable visualized distal thoracic esophagus.  Abdomen: Unremarkable CT appearance of the stomach, duodenum, spleen, adrenal glands and pancreas.  Diffuse hypoattenuation of the hepatic parenchyma consistent with hepatic steatosis.  No discrete hepatic lesion identified.  Surgical changes of prior cholecystectomy.  No  intra or extrahepatic biliary ductal dilatation.  Symmetric renal parenchymal enhancement bilaterally.  No hydronephrosis, nephrolithiasis or enhancing renal mass.  Unremarkable colon and terminal ileum.  The appendix is not identified and may be surgically absent.  No inflammatory change in the right lower quadrant.  Multiple loops of dilated and fluid- filled small bowel noted in the mid abdomen.  There is a focal lesion abnormal segment of the small bowel to the left of midline in the mid abdomen which demonstrates diffuse submucosal wall thickening and narrowing of the bowel lumen.  Overall, findings are consistent with high-grade chronic partial small bowel obstruction. No free fluid, or free air.  Pelvis: Unremarkable bladder, prostate gland and seminal vesicles.  No free fluid or suspicious adenopathy.  Bones: No acute fracture or aggressive appearing lytic or blastic osseous lesion.  Vascular: No acute vascular abnormality or significant atherosclerotic vascular disease.  IMPRESSION:  1.  CT findings are most consistent with high-grade and likely chronic partial small bowel obstruction secondary to a focally abnormal segment of jejunum which demonstrates submucosal thickening and stenosis of the bowel lumen.  Differential considerations include acute enteritis, or chronic post inflammatory stenosis.  Query clinical history of Crohn disease or other inflammatory enteritis.  An infectious enteritis is also possible, but considered less likely given the similar appearance on the CT scan from December 2011.  2.  Cardiomegaly  3.  Hepatic steatosis  4.  Surgical changes of prior cholecystectomy and probable appendectomy   Original Report Authenticated By: Malachy Moan, M.D.               Blood pressure 101/70, pulse 86, temperature 99.2 F (37.3 C), temperature source Oral, resp. rate 20, height 5\' 4"  (1.626 m), weight 67.314 kg (148 lb 6.4 oz), SpO2 94.00%.  Physical exam:   General--  pleasant oriented white male. NG training dark greenish material approximately one liter Heart-- regular rate and rhythm without murmurs are gallops Lungs--clear  Abdomen-- distended and soft. Fairly silent with tenderness in the upper abdomen   Assessment: 1. Recurrent proximal small bowel obstruction. This appears to be in the same locations 3 years ago. He has hypokalemia and I would wonder if low potassium has contributed. He has no chronic medical problems. This very likely is due to adhesions.  Plan: 1. Would continue NG suction for now and repeat abdominal films in the morning. If he has not improved, surgical consultation would be appropriate., Once he improves a small bowel series or CT enterography would be appropriate to define the level of obstruction. 2 patient needs aggressive potassium replacement. Would recommend keeping his potassium 4.5 to 5.0.   Marquisa Salih JR,Karaline Buresh L 06/08/2013, 2:23 PM

## 2013-06-08 NOTE — Progress Notes (Signed)
[name] [id number] Admitted to 4U98 06/08/2013 4:03 AM Attending Provider: Jonah Blue, DO    [name] is a 62 y.o. male patient admitted from ED awake, alert  & orientated  X 3,  Full Code, VSS - Height 5\' 4"  (1.626 m), weight 67.314 kg (148 lb 6.4 oz)., room air, no c/o shortness of breath, no c/o chest pain, no distress noted. Tele # [id number] placed. pt is currently running:normal sinus rhythm.   IV site WDL:  forearm right, condition patent and no redness with a transparent dsg that's clean dry and intact.  Allergies:   Allergies  Allergen Reactions  . Bee Venom Anaphylaxis  . Penicillins Anaphylaxis     Past Medical History  Diagnosis Date  . SBO (small bowel obstruction) 09/2010    Resolved with conservative measures  . Lacunar infarction 2006/2007    Right basal ganglion, chronic lacunar infarct  . Hypertension     fluctuates  . CAD (coronary artery disease)     s/p MI in distant past (1995?); TTE 2010 - EF 55-60%  . Stenosis of right carotid artery   . TIA (transient ischemic attack) 06/2009  . Acute cerebral infarction 11/2005    right caudate internal capsule secondary to small vessel    History:  obtained from the patient..  Pt orientation to unit, room and routine. Information packet given to patient/family and safety video watched.  Admission INP armband ID verified with patient/family, and in place. SR up x 2, fall risk assessment complete with Patient and family verbalizing understanding of risks associated with falls. Pt verbalizes an understanding of how to use the call bell and to call for help before getting out of bed.  Skin, clean-dry- intact without evidence of bruising, or skin tears.   No evidence of skin break down noted on exam.    Will cont to monitor and assist as needed.  Tessie Eke, RN 06/08/2013 4:03 AM

## 2013-06-09 ENCOUNTER — Encounter (HOSPITAL_COMMUNITY): Payer: Self-pay | Admitting: General Surgery

## 2013-06-09 ENCOUNTER — Inpatient Hospital Stay (HOSPITAL_COMMUNITY): Payer: BC Managed Care – PPO

## 2013-06-09 DIAGNOSIS — I10 Essential (primary) hypertension: Secondary | ICD-10-CM

## 2013-06-09 DIAGNOSIS — K56609 Unspecified intestinal obstruction, unspecified as to partial versus complete obstruction: Secondary | ICD-10-CM

## 2013-06-09 LAB — GLUCOSE, CAPILLARY
Glucose-Capillary: 173 mg/dL — ABNORMAL HIGH (ref 70–99)
Glucose-Capillary: 110 mg/dL — ABNORMAL HIGH (ref 70–99)

## 2013-06-09 LAB — BASIC METABOLIC PANEL
CO2: 29 mEq/L (ref 19–32)
Calcium: 8.1 mg/dL — ABNORMAL LOW (ref 8.4–10.5)
Creatinine, Ser: 0.48 mg/dL — ABNORMAL LOW (ref 0.50–1.35)
Glucose, Bld: 197 mg/dL — ABNORMAL HIGH (ref 70–99)

## 2013-06-09 MED ORDER — POTASSIUM CHLORIDE 10 MEQ/100ML IV SOLN
10.0000 meq | INTRAVENOUS | Status: AC
  Administered 2013-06-09 (×3): 10 meq via INTRAVENOUS
  Filled 2013-06-09 (×3): qty 100

## 2013-06-09 MED ORDER — ACETAMINOPHEN 325 MG PO TABS: 650.0000 mg | ORAL_TABLET | ORAL | Status: AC

## 2013-06-09 NOTE — Care Management Note (Signed)
    Page 1 of 1   06/12/2013     10:46:29 AM   CARE MANAGEMENT NOTE 06/12/2013  Patient:  THAMAS, APPLEYARD   Account Number:  [id number]  Date Initiated:  06/09/2013  Documentation initiated by:  Letha Cape  Subjective/Objective Assessment:   dx sbo  admit- lives with spouse.     Action/Plan:   Anticipated DC Date:  06/12/2013   Anticipated DC Plan:  HOME W HOME HEALTH SERVICES      DC Planning Services  CM consult  Follow-up appt scheduled      Choice offered to / List presented to:             Status of service:  Completed, signed off Medicare Important Message given?   (If response is "NO", the following Medicare IM given date fields will be blank) Date Medicare IM given:   Date Additional Medicare IM given:    Discharge Disposition:  HOME/SELF CARE  Per UR Regulation:  Reviewed for med. necessity/level of care/duration of stay  If discussed at Long Length of Stay Meetings, dates discussed:    Comments:  06/12/13 10;45 Letha Cape RN, BSN [phone] patient for dc today, pcp f/u appt scheduled.  06/11/13 10:35 Letha Cape RN, BSN [phone] patient is for possible dc today, patient is set up with [facility] for hospital follow up.  Patient has medication coverage and transportation at dc.  06/09/13 16:25 Letha Cape RN, BSN [phone] patient lives with spouse, NCM will continue to follow for dc needs.

## 2013-06-09 NOTE — Progress Notes (Signed)
[facility] PROGRESS NOTE Subjective Feels better  Objective: Vital signs in last 24 hours: Temp:  [98.2 F (36.8 C)-99 F (37.2 C)] 99 F (37.2 C) (08/18 0523) Pulse Rate:  [87-96] 87 (08/18 0523) Resp:  [18-20] 18 (08/18 0523) BP: (128-148)/(77) 148/77 mmHg (08/18 0523) SpO2:  [92 %-94 %] 94 % (08/18 0523) Last BM Date: 06/07/13  Intake/Output from previous day: 08/17 0701 - 08/18 0700 In: 2400 [I.V.:2400] Out: 675 [Emesis/NG output:675] Intake/Output this shift:    PE: General--ng green Heart-- Lungs-- Abdomen--much less tender/distended  Lab Results:  Recent Labs  06/07/13 2155 06/08/13 0630  WBC 9.0 5.9  HGB 14.9 13.9  HCT 40.9 38.3*  PLT 285 274   BMET  Recent Labs  06/07/13 2155 06/08/13 0630 06/09/13 0525  NA 134* 134* 136  K 3.5 3.3* 3.2*  CL 96 98 100  CO2 26 27 29   CREATININE 0.50 0.49* 0.48*   LFT  Recent Labs  06/07/13 2155  PROT 6.6  AST 30  ALT 37  ALKPHOS 88  BILITOT 1.3*   PT/INR No results found for this basename: LABPROT, INR,  in the last 72 hours PANCREAS  Recent Labs  06/07/13 2155  LIPASE 17         Studies/Results: Ct Abdomen Pelvis W Contrast  06/08/2013   *RADIOLOGY REPORT*  Clinical Data: Abdominal pain  CT ABDOMEN AND PELVIS WITH CONTRAST  Technique:  Multidetector CT imaging of the abdomen and pelvis was performed following the standard protocol during bolus administration of intravenous contrast.  Contrast: OMNIPAQUE IOHEXOL 300 MG/ML  SOLN  Comparison: Most recent prior CT abdomen/pelvis 09/23/2010  Findings:  Lower Chest:  Respiratory motion limits evaluation of the lung bases.  No focal airspace consolidation or definite pulmonary nodule.  Mild dependent atelectasis noted bilaterally.  Mild cardiomegaly.  No pericardial effusion.  Unremarkable visualized distal thoracic esophagus.  Abdomen: Unremarkable CT appearance of the stomach, duodenum, spleen, adrenal glands and pancreas.  Diffuse  hypoattenuation of the hepatic parenchyma consistent with hepatic steatosis.  No discrete hepatic lesion identified.  Surgical changes of prior cholecystectomy.  No intra or extrahepatic biliary ductal dilatation.  Symmetric renal parenchymal enhancement bilaterally.  No hydronephrosis, nephrolithiasis or enhancing renal mass.  Unremarkable colon and terminal ileum.  The appendix is not identified and may be surgically absent.  No inflammatory change in the right lower quadrant.  Multiple loops of dilated and fluid- filled small bowel noted in the mid abdomen.  There is a focal lesion abnormal segment of the small bowel to the left of midline in the mid abdomen which demonstrates diffuse submucosal wall thickening and narrowing of the bowel lumen.  Overall, findings are consistent with high-grade chronic partial small bowel obstruction. No free fluid, or free air.  Pelvis: Unremarkable bladder, prostate gland and seminal vesicles. No free fluid or suspicious adenopathy.  Bones: No acute fracture or aggressive appearing lytic or blastic osseous lesion.  Vascular: No acute vascular abnormality or significant atherosclerotic vascular disease.  IMPRESSION:  1.  CT findings are most consistent with high-grade and likely chronic partial small bowel obstruction secondary to a focally abnormal segment of jejunum which demonstrates submucosal thickening and stenosis of the bowel lumen.  Differential considerations include acute enteritis, or chronic post inflammatory stenosis.  Query clinical history of Crohn disease or other inflammatory enteritis.  An infectious enteritis is also possible, but considered less likely given the similar appearance on the CT scan from December 2011.  2.  Cardiomegaly  3.  Hepatic steatosis  4.  Surgical changes of prior cholecystectomy and probable appendectomy   Original Report Authenticated By: Malachy Moan, M.D.   Dg Abd 2 Views  06/09/2013   *RADIOLOGY REPORT*  Clinical Data:   Abdominal pain, follow small bowel obstruction  ABDOMEN - 2 VIEW  Comparison: Multiple prior studies including CT scan 06/07/2013  Findings: There is an NG tube projecting over the stomach.  There is no free air.  Oral contrast from recent CT scan is seen into the rectum.  There are loops of dilated proximal small bowel in the right and left upper quadrants, dilated up to 5.1 cm.  IMPRESSION: Similar findings as on recent CT scan consistent with partial small bowel obstruction proximally.   Original Report Authenticated By: Esperanza Heir, M.D.    Medications: I have reviewed the patient's current medications.  Assessment/Plan: 1. Prox SBO. Films not much better but PE is better. Have asked the surgeons to see him.   Shadi Larner JR,Anthonia Monger L 06/09/2013, 8:53 AM

## 2013-06-09 NOTE — Consult Note (Signed)
[name] [birth year]  [id number].   Requesting MD: Dr. Carman Ching Chief Complaint/Reason for Consult: PSBO of jejunum HPI: this is a 62 yo male with a h/o MI at age 44yo and a CVA who was admitted 3 years ago for what was thought to be small bowel enteritis at the level of his jejunum.  He improved and was discharged home.  He was supposed to follow up, but never did because he was doing so well.  Last Monday, though, he developed abdominal discomfort along with diarrhea.  He went to the ED on Wednesday and he was told he had gastroenteritis.  His pain persisted and he went back and Friday and was found to have a PSBO at the same place in his jejunum that was present 3 years prior.  He was admitted and an NGT was placed.  We have been consulted to evaluate for further recommendations.  Review of systems: Please see HPI, otherwise all other systems are negative.  Family History  Problem Relation Age of Onset  . Lung cancer Mother   . Emphysema Mother   . Heart failure Father     Past Medical History  Diagnosis Date  . SBO (small bowel obstruction) 09/2010    Resolved with conservative measures  . Lacunar infarction 2006/2007    Right basal ganglion, chronic lacunar infarct  . Hypertension     fluctuates  . CAD (coronary artery disease)     s/p MI in distant past (1995?); TTE 2010 - EF 55-60%  . Stenosis of right carotid artery   . TIA (transient ischemic attack) 06/2009  . Acute cerebral infarction 11/2005    right caudate internal capsule secondary to small vessel    Past Surgical History  Procedure Laterality Date  . Laparoscopic cholecystectomy  2009    by Dr. Derrell Lolling  . Broken collarbone    . Broken shoulder      Social History:  reports that he has never smoked. He has never used smokeless tobacco. He reports that he does not drink alcohol or use illicit drugs.  Allergies:  Allergies  Allergen Reactions  . Bee Venom Anaphylaxis  . Penicillins Anaphylaxis     Medications Prior to Admission  Medication Sig Dispense Refill  . aspirin EC 81 MG tablet Take 81 mg by mouth daily. For pain        Blood pressure 148/77, pulse 87, temperature 99 F (37.2 C), temperature source Oral, resp. rate 18, height 5\' 4"  (1.626 m), weight 148 lb 6.4 oz (67.314 kg), SpO2 94.00%. Physical Exam: General: pleasant, WD, WN white male who is laying in bed in NAD HEENT: head is normocephalic, atraumatic.  Sclera are noninjected.  PERRL.  Ears and nose without any masses or lesions.  Mouth is pink but dry.  NGT in place with some bilious output Heart: regular, rate, and rhythm.  Normal s1,s2. No obvious murmurs, gallops, or rubs noted.  Palpable radial and pedal pulses bilaterally Lungs: CTAB, no wheezes, rhonchi, or rales noted.  Respiratory effort nonlabored Abd: soft, diffusely tender, but no guarding or rebounding, ND, +BS, no masses, hernias, or organomegaly MS: all 4 extremities are symmetrical with no cyanosis, clubbing, or edema. Skin: warm and dry with no masses, lesions, or rashes Psych: A&Ox3 with an appropriate affect.    Results for orders placed during the hospital encounter of 06/07/13 (from the past 48 hour(s))  URINALYSIS, ROUTINE W REFLEX MICROSCOPIC     Status: Abnormal   Collection Time  06/07/13  9:49 PM      Result Value Range   Color, Urine AMBER (*) YELLOW   Comment: BIOCHEMICALS MAY BE AFFECTED BY COLOR   APPearance CLOUDY (*) CLEAR   Specific Gravity, Urine 1.020  1.005 - 1.030   pH 6.0  5.0 - 8.0   Glucose, UA 250 (*) NEGATIVE mg/dL   Hgb urine dipstick NEGATIVE  NEGATIVE   Bilirubin Urine SMALL (*) NEGATIVE   Ketones, ur >80 (*) NEGATIVE mg/dL   Protein, ur NEGATIVE  NEGATIVE mg/dL   Urobilinogen, UA 1.0  0.0 - 1.0 mg/dL   Nitrite NEGATIVE  NEGATIVE   Leukocytes, UA NEGATIVE  NEGATIVE   Comment: MICROSCOPIC NOT DONE ON URINES WITH NEGATIVE PROTEIN, BLOOD, LEUKOCYTES, NITRITE, OR GLUCOSE <1000 mg/dL.  COMPREHENSIVE METABOLIC  PANEL     Status: Abnormal   Collection Time    06/07/13  9:55 PM      Result Value Range   Sodium 134 (*) 135 - 145 mEq/L   Potassium 3.5  3.5 - 5.1 mEq/L   Chloride 96  96 - 112 mEq/L   CO2 26  19 - 32 mEq/L   Glucose, Bld 224 (*) 70 - 99 mg/dL   BUN 10  6 - 23 mg/dL   Creatinine, Ser 1.61  0.50 - 1.35 mg/dL   Calcium 8.1 (*) 8.4 - 10.5 mg/dL   Total Protein 6.6  6.0 - 8.3 g/dL   Albumin 3.0 (*) 3.5 - 5.2 g/dL   AST 30  0 - 37 U/L   ALT 37  0 - 53 U/L   Alkaline Phosphatase 88  39 - 117 U/L   Total Bilirubin 1.3 (*) 0.3 - 1.2 mg/dL   GFR calc non Af Amer >90  >90 mL/min   GFR calc Af Amer >90  >90 mL/min   Comment: (NOTE)     The eGFR has been calculated using the CKD EPI equation.     This calculation has not been validated in all clinical situations.     eGFR's persistently <90 mL/min signify possible Chronic Kidney     Disease.  LIPASE, BLOOD     Status: None   Collection Time    06/07/13  9:55 PM      Result Value Range   Lipase 17  11 - 59 U/L  CBC WITH DIFFERENTIAL     Status: Abnormal   Collection Time    06/07/13  9:55 PM      Result Value Range   WBC 9.0  4.0 - 10.5 K/uL   RBC 4.64  4.22 - 5.81 MIL/uL   Hemoglobin 14.9  13.0 - 17.0 g/dL   HCT 09.6  04.5 - 40.9 %   MCV 88.1  78.0 - 100.0 fL   MCH 32.1  26.0 - 34.0 pg   MCHC 36.4 (*) 30.0 - 36.0 g/dL   RDW 81.1  91.4 - 78.2 %   Platelets 285  150 - 400 K/uL   Neutrophils Relative % 81 (*) 43 - 77 %   Neutro Abs 7.2  1.7 - 7.7 K/uL   Lymphocytes Relative 4 (*) 12 - 46 %   Lymphs Abs 0.4 (*) 0.7 - 4.0 K/uL   Monocytes Relative 15 (*) 3 - 12 %   Monocytes Absolute 1.3 (*) 0.1 - 1.0 K/uL   Eosinophils Relative 0  0 - 5 %   Eosinophils Absolute 0.0  0.0 - 0.7 K/uL   Basophils Relative 0  0 - 1 %  Basophils Absolute 0.0  0.0 - 0.1 K/uL  LACTIC ACID, PLASMA     Status: None   Collection Time    06/07/13  9:55 PM      Result Value Range   Lactic Acid, Venous 1.5  0.5 - 2.2 mmol/L  GLUCOSE, CAPILLARY      Status: Abnormal   Collection Time    06/08/13  3:59 AM      Result Value Range   Glucose-Capillary 229 (*) 70 - 99 mg/dL  CBC     Status: Abnormal   Collection Time    06/08/13  6:30 AM      Result Value Range   WBC 5.9  4.0 - 10.5 K/uL   RBC 4.33  4.22 - 5.81 MIL/uL   Hemoglobin 13.9  13.0 - 17.0 g/dL   HCT 16.1 (*) 09.6 - 04.5 %   MCV 88.5  78.0 - 100.0 fL   MCH 32.1  26.0 - 34.0 pg   MCHC 36.3 (*) 30.0 - 36.0 g/dL   RDW 40.9  81.1 - 91.4 %   Platelets 274  150 - 400 K/uL  BASIC METABOLIC PANEL     Status: Abnormal   Collection Time    06/08/13  6:30 AM      Result Value Range   Sodium 134 (*) 135 - 145 mEq/L   Potassium 3.3 (*) 3.5 - 5.1 mEq/L   Chloride 98  96 - 112 mEq/L   CO2 27  19 - 32 mEq/L   Glucose, Bld 238 (*) 70 - 99 mg/dL   BUN 9  6 - 23 mg/dL   Creatinine, Ser 7.82 (*) 0.50 - 1.35 mg/dL   Calcium 8.3 (*) 8.4 - 10.5 mg/dL   GFR calc non Af Amer >90  >90 mL/min   GFR calc Af Amer >90  >90 mL/min   Comment: (NOTE)     The eGFR has been calculated using the CKD EPI equation.     This calculation has not been validated in all clinical situations.     eGFR's persistently <90 mL/min signify possible Chronic Kidney     Disease.  HEMOGLOBIN A1C     Status: Abnormal   Collection Time    06/08/13  6:30 AM      Result Value Range   Hemoglobin A1C 11.1 (*) <5.7 %   Comment: (NOTE)                                                                               According to the ADA Clinical Practice Recommendations for 2011, when     HbA1c is used as a screening test:      >=6.5%   Diagnostic of Diabetes Mellitus               (if abnormal result is confirmed)     5.7-6.4%   Increased risk of developing Diabetes Mellitus     References:Diagnosis and Classification of Diabetes Mellitus,Diabetes     Care,2011,34(Suppl 1):S62-S69 and Standards of Medical Care in             Diabetes - 2011,Diabetes Care,2011,34 (Suppl 1):S11-S61.   Mean Plasma Glucose 272 (*) <117  mg/dL  Comment: Performed at [facility]  GLUCOSE, CAPILLARY     Status: Abnormal   Collection Time    06/08/13  7:40 AM      Result Value Range   Glucose-Capillary 220 (*) 70 - 99 mg/dL  GLUCOSE, CAPILLARY     Status: Abnormal   Collection Time    06/08/13 11:49 AM      Result Value Range   Glucose-Capillary 207 (*) 70 - 99 mg/dL  GLUCOSE, CAPILLARY     Status: Abnormal   Collection Time    06/08/13  5:06 PM      Result Value Range   Glucose-Capillary 168 (*) 70 - 99 mg/dL  GLUCOSE, CAPILLARY     Status: Abnormal   Collection Time    06/08/13  8:56 PM      Result Value Range   Glucose-Capillary 179 (*) 70 - 99 mg/dL  GLUCOSE, CAPILLARY     Status: Abnormal   Collection Time    06/08/13 11:59 PM      Result Value Range   Glucose-Capillary 197 (*) 70 - 99 mg/dL  GLUCOSE, CAPILLARY     Status: Abnormal   Collection Time    06/09/13  3:51 AM      Result Value Range   Glucose-Capillary 173 (*) 70 - 99 mg/dL  BASIC METABOLIC PANEL     Status: Abnormal   Collection Time    06/09/13  5:25 AM      Result Value Range   Sodium 136  135 - 145 mEq/L   Potassium 3.2 (*) 3.5 - 5.1 mEq/L   Chloride 100  96 - 112 mEq/L   CO2 29  19 - 32 mEq/L   Glucose, Bld 197 (*) 70 - 99 mg/dL   BUN 7  6 - 23 mg/dL   Creatinine, Ser 1.61 (*) 0.50 - 1.35 mg/dL   Calcium 8.1 (*) 8.4 - 10.5 mg/dL   GFR calc non Af Amer >90  >90 mL/min   GFR calc Af Amer >90  >90 mL/min   Comment: (NOTE)     The eGFR has been calculated using the CKD EPI equation.     This calculation has not been validated in all clinical situations.     eGFR's persistently <90 mL/min signify possible Chronic Kidney     Disease.  GLUCOSE, CAPILLARY     Status: Abnormal   Collection Time    06/09/13  7:36 AM      Result Value Range   Glucose-Capillary 173 (*) 70 - 99 mg/dL   Comment 1 Documented in Chart     Comment 2 Notify RN     Ct Abdomen Pelvis W Contrast  06/08/2013   *RADIOLOGY REPORT*  Clinical Data:  Abdominal pain  CT ABDOMEN AND PELVIS WITH CONTRAST  Technique:  Multidetector CT imaging of the abdomen and pelvis was performed following the standard protocol during bolus administration of intravenous contrast.  Contrast: OMNIPAQUE IOHEXOL 300 MG/ML  SOLN  Comparison: Most recent prior CT abdomen/pelvis 09/23/2010  Findings:  Lower Chest:  Respiratory motion limits evaluation of the lung bases.  No focal airspace consolidation or definite pulmonary nodule.  Mild dependent atelectasis noted bilaterally.  Mild cardiomegaly.  No pericardial effusion.  Unremarkable visualized distal thoracic esophagus.  Abdomen: Unremarkable CT appearance of the stomach, duodenum, spleen, adrenal glands and pancreas.  Diffuse hypoattenuation of the hepatic parenchyma consistent with hepatic steatosis.  No discrete hepatic lesion identified.  Surgical changes of prior cholecystectomy.  No  intra or extrahepatic biliary ductal dilatation.  Symmetric renal parenchymal enhancement bilaterally.  No hydronephrosis, nephrolithiasis or enhancing renal mass.  Unremarkable colon and terminal ileum.  The appendix is not identified and may be surgically absent.  No inflammatory change in the right lower quadrant.  Multiple loops of dilated and fluid- filled small bowel noted in the mid abdomen.  There is a focal lesion abnormal segment of the small bowel to the left of midline in the mid abdomen which demonstrates diffuse submucosal wall thickening and narrowing of the bowel lumen.  Overall, findings are consistent with high-grade chronic partial small bowel obstruction. No free fluid, or free air.  Pelvis: Unremarkable bladder, prostate gland and seminal vesicles. No free fluid or suspicious adenopathy.  Bones: No acute fracture or aggressive appearing lytic or blastic osseous lesion.  Vascular: No acute vascular abnormality or significant atherosclerotic vascular disease.  IMPRESSION:  1.  CT findings are most consistent with high-grade  and likely chronic partial small bowel obstruction secondary to a focally abnormal segment of jejunum which demonstrates submucosal thickening and stenosis of the bowel lumen.  Differential considerations include acute enteritis, or chronic post inflammatory stenosis.  Query clinical history of Crohn disease or other inflammatory enteritis.  An infectious enteritis is also possible, but considered less likely given the similar appearance on the CT scan from December 2011.  2.  Cardiomegaly  3.  Hepatic steatosis  4.  Surgical changes of prior cholecystectomy and probable appendectomy   Original Report Authenticated By: Malachy Moan, M.D.   Dg Abd 2 Views  06/09/2013   *RADIOLOGY REPORT*  Clinical Data:  Abdominal pain, follow small bowel obstruction  ABDOMEN - 2 VIEW  Comparison: Multiple prior studies including CT scan 06/07/2013  Findings: There is an NG tube projecting over the stomach.  There is no free air.  Oral contrast from recent CT scan is seen into the rectum.  There are loops of dilated proximal small bowel in the right and left upper quadrants, dilated up to 5.1 cm.  IMPRESSION: Similar findings as on recent CT scan consistent with partial small bowel obstruction proximally.   Original Report Authenticated By: Esperanza Heir, M.D.       Assessment/Plan 1. PSBO due to jejunal thickening 2. H/o MI 3. H/O CVA 4. HTN  Plan: 1. We will get a SBFT today to further evaluate this area.  The patient may likely require surgical excision of this focal area of thickening causing his partial obstruction.  We will make further recommendations after his SBFT is complete.  Leave NGT for now.   Neidra Girvan E 06/09/2013, 10:08 AM Pager: [phone]

## 2013-06-09 NOTE — Consult Note (Signed)
Recurrent proximal SB obstruction - much improved with NG tube No significant bowel function yet - some flatus  Will obtain contrast small bowel follow-through today to better define the area of stricture/ PSBO.  May need surgical resection.  Wilmon Arms. Corliss Skains, MD, [facility]  General/ Trauma Surgery  06/09/2013 10:51 AM

## 2013-06-09 NOTE — Progress Notes (Signed)
[facility] PROGRESS NOTE  [name] OZH:[id number] DOB: [birth year] DOA: 06/07/2013 PCP: No PCP Per Patient  Assessment/Plan: 1. Chronic small bowel obstruction- CT scan shows jejunal inflammation. Gi and surgery following, much improved after NG suction. SBFT today, will follow the results. 2. Diabetes mellitus- continue with SSI. Blood glucose is stable. 3. Hypokalemia- Replace the potassium. 4. DVT prophylaxis- lovenox  Code Status: Full Family Communication: *No family at bedside Disposition Plan:  Home when stable   Consultants:  GI  Procedures:  None  Antibiotics:  *None  HPI/Subjective: Patient seen and examined, has NG tube in place. Low intermittent suction.  Objective: Filed Vitals:   06/09/13 0523  BP: 148/77  Pulse: 87  Temp: 99 F (37.2 C)  Resp: 18    Intake/Output Summary (Last 24 hours) at 06/09/13 1615 Last data filed at 06/09/13 0500  Gross per 24 hour  Intake   2050 ml  Output    675 ml  Net   1375 ml   Filed Weights   06/08/13 0341 06/08/13 0356  Weight: 67.314 kg (148 lb 6.4 oz) 67.314 kg (148 lb 6.4 oz)    Exam:   General:  Appear in no acute distress  Cardiovascular: S1s2 RRR  Respiratory: Clear bilaterally  Abdomen: Soft, distended, NG tube in place  Musculoskeletal: No edema  Data Reviewed: Basic Metabolic Panel:  Recent Labs Lab 06/04/13 1756 06/07/13 2155 06/08/13 0630 06/09/13 0525  NA 128* 134* 134* 136  K 3.6 3.5 3.3* 3.2*  CL 92* 96 98 100  CO2 25 26 27 29   GLUCOSE 307* 224* 238* 197*  BUN 10 10 9 7   CREATININE 0.58 0.50 0.49* 0.48*  CALCIUM 9.0 8.1* 8.3* 8.1*   Liver Function Tests:  Recent Labs Lab 06/04/13 1756 06/07/13 2155  AST 27 30  ALT 46 37  ALKPHOS 120* 88  BILITOT 1.4* 1.3*  PROT 7.8 6.6  ALBUMIN 3.8 3.0*    Recent Labs Lab 06/04/13 1756 06/07/13 2155  LIPASE 25 17   No results found for this basename: AMMONIA,  in the last 168 hours CBC:  Recent Labs Lab  06/04/13 1756 06/07/13 2155 06/08/13 0630  WBC 10.0 9.0 5.9  NEUTROABS 7.2 7.2  --   HGB 16.3 14.9 13.9  HCT 44.1 40.9 38.3*  MCV 88.4 88.1 88.5  PLT 275 285 274   Cardiac Enzymes: No results found for this basename: CKTOTAL, CKMB, CKMBINDEX, TROPONINI,  in the last 168 hours BNP (last 3 results) No results found for this basename: PROBNP,  in the last 8760 hours CBG:  Recent Labs Lab 06/08/13 1706 06/08/13 2056 06/08/13 2359 06/09/13 0351 06/09/13 0736  GLUCAP 168* 179* 197* 173* 173*    Recent Results (from the past 240 hour(s))  URINE CULTURE     Status: None   Collection Time    06/04/13  7:20 PM      Result Value Range Status   Specimen Description URINE, RANDOM   Final   Special Requests NONE   Final   Culture  Setup Time     Final   Value: 06/04/2013 23:15     Performed at [facility] Count     Final   Value: NO GROWTH     Performed at [facility]   Culture     Final   Value: NO GROWTH     Performed at [facility]   Report Status 06/06/2013 FINAL   Final  Studies: Ct Abdomen Pelvis W Contrast  06/08/2013   *RADIOLOGY REPORT*  Clinical Data: Abdominal pain  CT ABDOMEN AND PELVIS WITH CONTRAST  Technique:  Multidetector CT imaging of the abdomen and pelvis was performed following the standard protocol during bolus administration of intravenous contrast.  Contrast: OMNIPAQUE IOHEXOL 300 MG/ML  SOLN  Comparison: Most recent prior CT abdomen/pelvis 09/23/2010  Findings:  Lower Chest:  Respiratory motion limits evaluation of the lung bases.  No focal airspace consolidation or definite pulmonary nodule.  Mild dependent atelectasis noted bilaterally.  Mild cardiomegaly.  No pericardial effusion.  Unremarkable visualized distal thoracic esophagus.  Abdomen: Unremarkable CT appearance of the stomach, duodenum, spleen, adrenal glands and pancreas.  Diffuse hypoattenuation of the hepatic parenchyma consistent with hepatic  steatosis.  No discrete hepatic lesion identified.  Surgical changes of prior cholecystectomy.  No intra or extrahepatic biliary ductal dilatation.  Symmetric renal parenchymal enhancement bilaterally.  No hydronephrosis, nephrolithiasis or enhancing renal mass.  Unremarkable colon and terminal ileum.  The appendix is not identified and may be surgically absent.  No inflammatory change in the right lower quadrant.  Multiple loops of dilated and fluid- filled small bowel noted in the mid abdomen.  There is a focal lesion abnormal segment of the small bowel to the left of midline in the mid abdomen which demonstrates diffuse submucosal wall thickening and narrowing of the bowel lumen.  Overall, findings are consistent with high-grade chronic partial small bowel obstruction. No free fluid, or free air.  Pelvis: Unremarkable bladder, prostate gland and seminal vesicles. No free fluid or suspicious adenopathy.  Bones: No acute fracture or aggressive appearing lytic or blastic osseous lesion.  Vascular: No acute vascular abnormality or significant atherosclerotic vascular disease.  IMPRESSION:  1.  CT findings are most consistent with high-grade and likely chronic partial small bowel obstruction secondary to a focally abnormal segment of jejunum which demonstrates submucosal thickening and stenosis of the bowel lumen.  Differential considerations include acute enteritis, or chronic post inflammatory stenosis.  Query clinical history of Crohn disease or other inflammatory enteritis.  An infectious enteritis is also possible, but considered less likely given the similar appearance on the CT scan from December 2011.  2.  Cardiomegaly  3.  Hepatic steatosis  4.  Surgical changes of prior cholecystectomy and probable appendectomy   Original Report Authenticated By: Malachy Moan, M.D.   Dg Abd 2 Views  06/09/2013   *RADIOLOGY REPORT*  Clinical Data:  Abdominal pain, follow small bowel obstruction  ABDOMEN - 2 VIEW   Comparison: Multiple prior studies including CT scan 06/07/2013  Findings: There is an NG tube projecting over the stomach.  There is no free air.  Oral contrast from recent CT scan is seen into the rectum.  There are loops of dilated proximal small bowel in the right and left upper quadrants, dilated up to 5.1 cm.  IMPRESSION: Similar findings as on recent CT scan consistent with partial small bowel obstruction proximally.   Original Report Authenticated By: Esperanza Heir, M.D.    Scheduled Meds: . acetaminophen  650 mg Oral NOW  . enoxaparin (LOVENOX) injection  40 mg Subcutaneous Q24H  . insulin aspart  0-9 Units Subcutaneous Q4H  . potassium chloride  10 mEq Intravenous Q1 Hr x 3  . sodium chloride  3 mL Intravenous Q12H   Continuous Infusions:    Principal Problem:   Small bowel obstruction Active Problems:   Vomiting    Time spent: 25 min  [facility]  [facility] Pager [phone]*. If 7PM-7AM, please contact night-coverage at [email or web address], password [facility] 06/09/2013, 4:15 PM  LOS: 2 days

## 2013-06-09 NOTE — Progress Notes (Signed)
MD notified that patient had a acute Rt shoulder pain 9/10.  Patient stated that this was not a normal occurrence.  EKG ordered.  Results text paged to MD.  Will continue to monitor patient.

## 2013-06-10 ENCOUNTER — Inpatient Hospital Stay (HOSPITAL_COMMUNITY): Payer: BC Managed Care – PPO

## 2013-06-10 LAB — CBC
HCT: 39.2 % (ref 39.0–52.0)
Hemoglobin: 14.4 g/dL (ref 13.0–17.0)
RDW: 12.5 % (ref 11.5–15.5)
WBC: 8 10*3/uL (ref 4.0–10.5)

## 2013-06-10 LAB — GLUCOSE, CAPILLARY
Glucose-Capillary: 95 mg/dL (ref 70–99)
Glucose-Capillary: 95 mg/dL (ref 70–99)
Glucose-Capillary: 186 mg/dL — ABNORMAL HIGH (ref 70–99)

## 2013-06-10 LAB — BASIC METABOLIC PANEL
Chloride: 101 mEq/L (ref 96–112)
Creatinine, Ser: 0.46 mg/dL — ABNORMAL LOW (ref 0.50–1.35)
GFR calc Af Amer: >90 mL/min (ref >90)
GFR calc non Af Amer: >90 mL/min (ref >90)
Potassium: 3.5 mEq/L (ref 3.5–5.1)

## 2013-06-10 MED ORDER — LIVING WELL WITH DIABETES BOOK
Freq: Once | Status: AC
  Administered 2013-06-10: 17:00:00
  Filled 2013-06-10: qty 1

## 2013-06-10 MED ORDER — CHLORHEXIDINE GLUCONATE 0.12 % MT SOLN
15.0000 mL | Freq: Two times a day (BID) | OROMUCOSAL | Status: DC
  Administered 2013-06-10 – 2013-06-12 (×4): 15 mL via OROMUCOSAL
  Filled 2013-06-10 (×8): qty 15

## 2013-06-10 MED ORDER — BOOST / RESOURCE BREEZE PO LIQD
1.0000 | Freq: Three times a day (TID) | ORAL | Status: DC
  Administered 2013-06-10 – 2013-06-12 (×6): 1 via ORAL

## 2013-06-10 MED ORDER — BIOTENE DRY MOUTH MT LIQD
15.0000 mL | Freq: Two times a day (BID) | OROMUCOSAL | Status: DC
  Administered 2013-06-10 – 2013-06-12 (×5): 15 mL via OROMUCOSAL

## 2013-06-10 NOTE — Progress Notes (Signed)
[facility] PROGRESS NOTE  [name] HYQ:[id number] DOB: January 06, 1951 DOA: 06/07/2013 PCP: No PCP Per Patient  Interim summary 62 yr. Old WM w/ hx SBO (resolved with conservative therapy years ago), hx CVA, HTN, CAD, likely underlying DM, presents with abdominal pain. He states he had gradual onset of abdominal pain this past Monday. He states he then began to have repeated episodes of N/V/D that have only worsened. He states he came to the ED three days ago and was sent some and told to increase his PO intake.  He states his abdomen has been increasingly distended and he has not been able to keep any food or liquids down. He denies fever, chills or any recent sick contacts. He has a hx of a lap chole years ago which he states occurred due to cholecystitis with rupture.  CT of the abdomen in the ED is notable for a high grade, possibly chronic small bowel obstruction due to a focally abnormal segment of jejunum with submucosal thickening. Patient was seen by GI and surgery and underwent small bowel follow-through. At this time obstruction has resolved NG tube was clamped and patient is started on clear liquid diet. The diet will be advanced if tolerated. He'll follow up as outpatient for possible CT enterography or pill camera to evaluate small bowel obstruction. Patient is also new onset diabetic, will need Lantus and metformin at the time of discharge.  He or camera    Assessment/Plan: Chronic small bowel obstruction- Improved, SBFT is normal, he has been started on clears, Surgery and GI following.  inflammation. GI and surgery following. Advance the diet as tolerated.Will follow-up as outpatient with either CT enterography or pill camera to evaluate SB.   1. Blurred vision- no focal defecits, most likely due to DM, will obtain CT head. 2. Diabetes mellitus- continue with SSI. Blood glucose is stable.His Hba1c is 11.1, Will start on Lantus 10 units subcut daily, and he will need to be  started on metformin 500 mg po BID at the time of discharge. 3. Hypokalemia- Potassium replaced. 4. DVT prophylaxis- lovenox  Code Status: Full Family Communication: *No family at bedside Disposition Plan:  Home when stable   Consultants:  GI  Procedures:  None  Antibiotics:  *None  HPI/Subjective: Patient seen and examined, has NG tube is now clamped and he is started on clear liquid diet, no vomiting.He is complaining of intermittent blurred vision since last night.  Objective: Filed Vitals:   06/10/13 0429  BP: 123/84  Pulse: 75  Temp: 98.2 F (36.8 C)  Resp: 18    Intake/Output Summary (Last 24 hours) at 06/10/13 1239 Last data filed at 06/10/13 1100  Gross per 24 hour  Intake      0 ml  Output   1200 ml  Net  -1200 ml   Filed Weights   06/08/13 0341 06/08/13 0356  Weight: 67.314 kg (148 lb 6.4 oz) 67.314 kg (148 lb 6.4 oz)    Exam:   General:  Appear in no acute distress  Cardiovascular: S1s2 RRR  Respiratory: Clear bilaterally  Abdomen: Soft, distended, NG tube in place  Musculoskeletal: No edema  Data Reviewed: Basic Metabolic Panel:  Recent Labs Lab 06/04/13 1756 06/07/13 2155 06/08/13 0630 06/09/13 0525 06/10/13 0500  NA 128* 134* 134* 136 138  K 3.6 3.5 3.3* 3.2* 3.5  CL 92* 96 98 100 101  CO2 25 26 27 29 25   GLUCOSE 307* 224* 238* 197* 104*  BUN 10 10 9  7 5*  CREATININE 0.58 0.50 0.49* 0.48* 0.46*  CALCIUM 9.0 8.1* 8.3* 8.1* 8.6   Liver Function Tests:  Recent Labs Lab 06/04/13 1756 06/07/13 2155  AST 27 30  ALT 46 37  ALKPHOS 120* 88  BILITOT 1.4* 1.3*  PROT 7.8 6.6  ALBUMIN 3.8 3.0*    Recent Labs Lab 06/04/13 1756 06/07/13 2155  LIPASE 25 17   No results found for this basename: AMMONIA,  in the last 168 hours CBC:  Recent Labs Lab 06/04/13 1756 06/07/13 2155 06/08/13 0630 06/10/13 0500  WBC 10.0 9.0 5.9 8.0  NEUTROABS 7.2 7.2  --   --   HGB 16.3 14.9 13.9 14.4  HCT 44.1 40.9 38.3* 39.2  MCV  88.4 88.1 88.5 88.3  PLT 275 285 274 368   Cardiac Enzymes: No results found for this basename: CKTOTAL, CKMB, CKMBINDEX, TROPONINI,  in the last 168 hours BNP (last 3 results) No results found for this basename: PROBNP,  in the last 8760 hours CBG:  Recent Labs Lab 06/09/13 2010 06/10/13 0003 06/10/13 0436 06/10/13 0741 06/10/13 1141  GLUCAP 99 93 95 97 95    Recent Results (from the past 240 hour(s))  URINE CULTURE     Status: None   Collection Time    06/04/13  7:20 PM      Result Value Range Status   Specimen Description URINE, RANDOM   Final   Special Requests NONE   Final   Culture  Setup Time     Final   Value: 06/04/2013 23:15     Performed at [facility] Count     Final   Value: NO GROWTH     Performed at [facility]   Culture     Final   Value: NO GROWTH     Performed at [facility]   Report Status 06/06/2013 FINAL   Final     Studies: Dg Small Bowel  06/09/2013   *RADIOLOGY REPORT*  Clinical Data:A partial small-bowel obstruction  SMALL BOWEL SERIES  Technique:  Following ingestion of a mixture of thin barium and EnteroVu, serial small bowel images were obtained including spot views of the terminal ileum.  Fluoroscopy Time: 1 minute, 29 seconds  Comparison: 06/07/2013  Findings: Contrast was administered by nasogastric tube.  A 15-minute images, there is filling to the proximal jejunum, with loops measuring up to 4.6 cm in diameter.  There is a small amount of contrast in the distal colon from prior imaging studies.  By 45 minutes, further filling of the jejunum is observed, with folds loops measuring up to 5.1 cm, and with normal fold pattern.  By 2 hours 15 minutes, there is partial filling of the ileum.  The proximal ileum is mildly dilated.  By 4 hours, contrast has made its way through to the colon. Proximal loops are more dilated than distal loops, and the terminal ileum appears unremarkable. The prior area of wall  thickening in the left upper quadrant jejunum shown on prior CT scan is not abnormal  currently, and may have been peristaltic or may represent improved inflammation.  A specific lesion accounting for the suspected small bowel obstruction is not currently identified.  IMPRESSION:  1.  Although they are moderately dilated loops of small bowel, and distal loops appear less dilated than the jejunum, we do not demonstrate a jejunal wall thickening in the region of concern on the prior exam or a specific lead point for obstruction.  Transit time to the small bowel was 4 hours, mildly abnormally dilated. Overall small bowel fold pattern unremarkable.  The appearance may reflect improving partial small-bowel obstruction or ileus.   Original Report Authenticated By: Gaylyn Rong, M.D.   Dg Abd 2 Views  06/09/2013   *RADIOLOGY REPORT*  Clinical Data:  Abdominal pain, follow small bowel obstruction  ABDOMEN - 2 VIEW  Comparison: Multiple prior studies including CT scan 06/07/2013  Findings: There is an NG tube projecting over the stomach.  There is no free air.  Oral contrast from recent CT scan is seen into the rectum.  There are loops of dilated proximal small bowel in the right and left upper quadrants, dilated up to 5.1 cm.  IMPRESSION: Similar findings as on recent CT scan consistent with partial small bowel obstruction proximally.   Original Report Authenticated By: Esperanza Heir, M.D.    Scheduled Meds: . antiseptic oral rinse  15 mL Mouth Rinse q12n4p  . chlorhexidine  15 mL Mouth Rinse BID  . enoxaparin (LOVENOX) injection  40 mg Subcutaneous Q24H  . insulin aspart  0-9 Units Subcutaneous Q4H  . living well with diabetes book   Does not apply Once  . sodium chloride  3 mL Intravenous Q12H   Continuous Infusions:    Principal Problem:   Small bowel obstruction Active Problems:   Vomiting    Time spent: 25 min    [facility]  [facility] Pager [phone]*. If 7PM-7AM, please  contact night-coverage at [email or web address], password [facility] 06/10/2013, 12:39 PM  LOS: 3 days

## 2013-06-10 NOTE — Progress Notes (Signed)
Large amounts of flatus Decreasing abdominal pain Normal appearing SBFT  Will d/c NG tube and start clears.  If he continues to improve, will need close follow-up as outpatient with either CT enterography or pill camera to evaluate SB.    Wilmon Arms. Corliss Skains, MD, [facility] Surgery  General/ Trauma Surgery  06/10/2013 10:30 AM

## 2013-06-10 NOTE — Progress Notes (Signed)
[facility] PROGRESS NOTE Subjective patient feeling much better. SBFT grossly normal  Objective: Vital signs in last 24 hours: Temp:  [98.2 F (36.8 C)-98.4 F (36.9 C)] 98.2 F (36.8 C) (08/19 0429) Pulse Rate:  [75-77] 75 (08/19 0429) Resp:  [18] 18 (08/19 0429) BP: (123-154)/(84-89) 123/84 mmHg (08/19 0429) SpO2:  [94 %-95 %] 94 % (08/19 0429) Last BM Date: 06/10/13  Intake/Output from previous day: 06-16-23 0701 - 08/19 0700 In: -  Out: 900 [Urine:300; Emesis/NG output:600] Intake/Output this shift: Total I/O In: -  Out: 300 [Urine:300]  PE: General-- Heart-- Lungs-- Abdomen-- none distended and soft good bowel sounds  Lab Results:  Recent Labs  06/07/13 2155 06/08/13 0630 06/10/13 0500  WBC 9.0 5.9 8.0  HGB 14.9 13.9 14.4  HCT 40.9 38.3* 39.2  PLT 285 274 368   BMET  Recent Labs  06/07/13 2155 06/08/13 0630 15-Jun-2013 0525 06/10/13 0500  NA 134* 134* 136 138  K 3.5 3.3* 3.2* 3.5  CL 96 98 100 101  CO2 26 27 29 25   CREATININE 0.50 0.49* 0.48* 0.46*   LFT  Recent Labs  06/07/13 2155  PROT 6.6  AST 30  ALT 37  ALKPHOS 88  BILITOT 1.3*   PT/INR No results found for this basename: LABPROT, INR,  in the last 72 hours PANCREAS  Recent Labs  06/07/13 2155  LIPASE 17         Studies/Results: Dg Small Bowel  2013-06-15   *RADIOLOGY REPORT*  Clinical Data:A partial small-bowel obstruction  SMALL BOWEL SERIES  Technique:  Following ingestion of a mixture of thin barium and EnteroVu, serial small bowel images were obtained including spot views of the terminal ileum.  Fluoroscopy Time: 1 minute, 29 seconds  Comparison: 06/07/2013  Findings: Contrast was administered by nasogastric tube.  A 15-minute images, there is filling to the proximal jejunum, with loops measuring up to 4.6 cm in diameter.  There is a small amount of contrast in the distal colon from prior imaging studies.  By 45 minutes, further filling of the jejunum is  observed, with folds loops measuring up to 5.1 cm, and with normal fold pattern.  By 2 hours 15 minutes, there is partial filling of the ileum.  The proximal ileum is mildly dilated.  By 4 hours, contrast has made its way through to the colon. Proximal loops are more dilated than distal loops, and the terminal ileum appears unremarkable. The prior area of wall thickening in the left upper quadrant jejunum shown on prior CT scan is not abnormal  currently, and may have been peristaltic or may represent improved inflammation.  A specific lesion accounting for the suspected small bowel obstruction is not currently identified.  IMPRESSION:  1.  Although they are moderately dilated loops of small bowel, and distal loops appear less dilated than the jejunum, we do not demonstrate a jejunal wall thickening in the region of concern on the prior exam or a specific lead point for obstruction.  Transit time to the small bowel was 4 hours, mildly abnormally dilated. Overall small bowel fold pattern unremarkable.  The appearance may reflect improving partial small-bowel obstruction or ileus.   Original Report Authenticated By: Gaylyn Rong, M.D.   Dg Abd 2 Views  Jun 15, 2013   *RADIOLOGY REPORT*  Clinical Data:  Abdominal pain, follow small bowel obstruction  ABDOMEN - 2 VIEW  Comparison: Multiple prior studies including CT scan 06/07/2013  Findings: There is an NG tube projecting over the stomach.  There  is no free air.  Oral contrast from recent CT scan is seen into the rectum.  There are loops of dilated proximal small bowel in the right and left upper quadrants, dilated up to 5.1 cm.  IMPRESSION: Similar findings as on recent CT scan consistent with partial small bowel obstruction proximally.   Original Report Authenticated By: Esperanza Heir, M.D.    Medications: I have reviewed the patient's current medications.  Assessment/Plan: 1. Proximal SBO. Appears to have resolved again. We'll DC NG suction and follow  the patient clinically.   Juno Bozard JR,Diron Haddon L 06/10/2013, 11:50 AM

## 2013-06-10 NOTE — Progress Notes (Signed)
Patient ID: [name], male   DOB: 1951/01/27, 62 y.o.   MRN: [id number]  Subjective: Pt states he is now passing flatus, still having abdominal pain, but less severe.  Has an episode of liquid diarrhea.  Denies n/v bloating.    Objective:  Vital signs:  Filed Vitals:   06/08/13 2127 06/09/13 0523 06/09/13 2140 06/10/13 0429  BP: 128/77 148/77 154/89 123/84  Pulse: 96 87 77 75  Temp: 98.2 F (36.8 C) 99 F (37.2 C) 98.4 F (36.9 C) 98.2 F (36.8 C)  TempSrc: Oral Oral Oral Oral  Resp: 20 18 18 18   Height:      Weight:      SpO2: 92% 94% 95% 94%    Last BM Date: 06/10/13  Intake/Output   Yesterday:  08/18 0701 - 08/19 0700 In: -  Out: 900 [Urine:300; Emesis/NG output:600] This shift:     Physical Exam:  General: Pt awake/alert/oriented x3 in no acute distress Psych:  No delerium/psychosis/paranoia Chest: CTA No chest wall pain w good excursion CV:  Pulses intact.  Regular rhythm MS: Normal AROM mjr joints.  No obvious deformity Abdomen: Soft.  Nondistended.  Mild diffuse tenderness.  No evidence of peritonitis.  No incarcerated hernias. Ext:  SCDs BLE.  No mjr edema.  No cyanosis Skin: No petechiae / purpura   Problem List:   Principal Problem:   Small bowel obstruction Active Problems:   Vomiting   Results:   Labs: Results for orders placed during the hospital encounter of 06/07/13 (from the past 48 hour(s))  GLUCOSE, CAPILLARY     Status: Abnormal   Collection Time    06/08/13 11:49 AM      Result Value Range   Glucose-Capillary 207 (*) 70 - 99 mg/dL  GLUCOSE, CAPILLARY     Status: Abnormal   Collection Time    06/08/13  5:06 PM      Result Value Range   Glucose-Capillary 168 (*) 70 - 99 mg/dL  GLUCOSE, CAPILLARY     Status: Abnormal   Collection Time    06/08/13  8:56 PM      Result Value Range   Glucose-Capillary 179 (*) 70 - 99 mg/dL  GLUCOSE, CAPILLARY     Status: Abnormal   Collection Time    06/08/13 11:59 PM      Result Value  Range   Glucose-Capillary 197 (*) 70 - 99 mg/dL  GLUCOSE, CAPILLARY     Status: Abnormal   Collection Time    06/09/13  3:51 AM      Result Value Range   Glucose-Capillary 173 (*) 70 - 99 mg/dL  BASIC METABOLIC PANEL     Status: Abnormal   Collection Time    06/09/13  5:25 AM      Result Value Range   Sodium 136  135 - 145 mEq/L   Potassium 3.2 (*) 3.5 - 5.1 mEq/L   Chloride 100  96 - 112 mEq/L   CO2 29  19 - 32 mEq/L   Glucose, Bld 197 (*) 70 - 99 mg/dL   BUN 7  6 - 23 mg/dL   Creatinine, Ser 4.09 (*) 0.50 - 1.35 mg/dL   Calcium 8.1 (*) 8.4 - 10.5 mg/dL   GFR calc non Af Amer >90  >90 mL/min   GFR calc Af Amer >90  >90 mL/min   Comment: (NOTE)     The eGFR has been calculated using the CKD EPI equation.     This calculation has  not been validated in all clinical situations.     eGFR's persistently <90 mL/min signify possible Chronic Kidney     Disease.  GLUCOSE, CAPILLARY     Status: Abnormal   Collection Time    06/09/13  7:36 AM      Result Value Range   Glucose-Capillary 173 (*) 70 - 99 mg/dL   Comment 1 Documented in Chart     Comment 2 Notify RN    GLUCOSE, CAPILLARY     Status: Abnormal   Collection Time    06/09/13  5:42 PM      Result Value Range   Glucose-Capillary 110 (*) 70 - 99 mg/dL   Comment 1 Documented in Chart     Comment 2 Notify RN    GLUCOSE, CAPILLARY     Status: None   Collection Time    06/09/13  8:10 PM      Result Value Range   Glucose-Capillary 99  70 - 99 mg/dL   Comment 1 Documented in Chart     Comment 2 Notify RN    GLUCOSE, CAPILLARY     Status: None   Collection Time    06/10/13 12:03 AM      Result Value Range   Glucose-Capillary 93  70 - 99 mg/dL   Comment 1 Notify RN     Comment 2 Documented in Chart    GLUCOSE, CAPILLARY     Status: None   Collection Time    06/10/13  4:36 AM      Result Value Range   Glucose-Capillary 95  70 - 99 mg/dL  BASIC METABOLIC PANEL     Status: Abnormal   Collection Time    06/10/13  5:00 AM       Result Value Range   Sodium 138  135 - 145 mEq/L   Potassium 3.5  3.5 - 5.1 mEq/L   Chloride 101  96 - 112 mEq/L   CO2 25  19 - 32 mEq/L   Glucose, Bld 104 (*) 70 - 99 mg/dL   BUN 5 (*) 6 - 23 mg/dL   Creatinine, Ser 3.08 (*) 0.50 - 1.35 mg/dL   Calcium 8.6  8.4 - 65.7 mg/dL   GFR calc non Af Amer >90  >90 mL/min   GFR calc Af Amer >90  >90 mL/min   Comment: (NOTE)     The eGFR has been calculated using the CKD EPI equation.     This calculation has not been validated in all clinical situations.     eGFR's persistently <90 mL/min signify possible Chronic Kidney     Disease.  CBC     Status: Abnormal   Collection Time    06/10/13  5:00 AM      Result Value Range   WBC 8.0  4.0 - 10.5 K/uL   RBC 4.44  4.22 - 5.81 MIL/uL   Hemoglobin 14.4  13.0 - 17.0 g/dL   HCT 84.6  96.2 - 95.2 %   MCV 88.3  78.0 - 100.0 fL   MCH 32.4  26.0 - 34.0 pg   MCHC 36.7 (*) 30.0 - 36.0 g/dL   RDW 84.1  32.4 - 40.1 %   Platelets 368  150 - 400 K/uL  GLUCOSE, CAPILLARY     Status: None   Collection Time    06/10/13  7:41 AM      Result Value Range   Glucose-Capillary 97  70 - 99 mg/dL   Comment 1 Documented  in Chart     Comment 2 Notify RN      Imaging / Studies: Dg Small Bowel  06/09/2013   *RADIOLOGY REPORT*  Clinical Data:A partial small-bowel obstruction  SMALL BOWEL SERIES  Technique:  Following ingestion of a mixture of thin barium and EnteroVu, serial small bowel images were obtained including spot views of the terminal ileum.  Fluoroscopy Time: 1 minute, 29 seconds  Comparison: 06/07/2013  Findings: Contrast was administered by nasogastric tube.  A 15-minute images, there is filling to the proximal jejunum, with loops measuring up to 4.6 cm in diameter.  There is a small amount of contrast in the distal colon from prior imaging studies.  By 45 minutes, further filling of the jejunum is observed, with folds loops measuring up to 5.1 cm, and with normal fold pattern.  By 2 hours 15 minutes,  there is partial filling of the ileum.  The proximal ileum is mildly dilated.  By 4 hours, contrast has made its way through to the colon. Proximal loops are more dilated than distal loops, and the terminal ileum appears unremarkable. The prior area of wall thickening in the left upper quadrant jejunum shown on prior CT scan is not abnormal  currently, and may have been peristaltic or may represent improved inflammation.  A specific lesion accounting for the suspected small bowel obstruction is not currently identified.  IMPRESSION:  1.  Although they are moderately dilated loops of small bowel, and distal loops appear less dilated than the jejunum, we do not demonstrate a jejunal wall thickening in the region of concern on the prior exam or a specific lead point for obstruction.  Transit time to the small bowel was 4 hours, mildly abnormally dilated. Overall small bowel fold pattern unremarkable.  The appearance may reflect improving partial small-bowel obstruction or ileus.   Original Report Authenticated By: Gaylyn Rong, M.D.   Dg Abd 2 Views  06/09/2013   *RADIOLOGY REPORT*  Clinical Data:  Abdominal pain, follow small bowel obstruction  ABDOMEN - 2 VIEW  Comparison: Multiple prior studies including CT scan 06/07/2013  Findings: There is an NG tube projecting over the stomach.  There is no free air.  Oral contrast from recent CT scan is seen into the rectum.  There are loops of dilated proximal small bowel in the right and left upper quadrants, dilated up to 5.1 cm.  IMPRESSION: Similar findings as on recent CT scan consistent with partial small bowel obstruction proximally.   Original Report Authenticated By: Esperanza Heir, M.D.    Assessment/Plan  1. PSBO due to jejunal thickening  2. H/o MI  3. H/O CVA  4. HTN  Small bowel follow through did demonstrate jejunal wall thickening seen on prior exams.  He is now passing flatus, but still exhibits diffuse tenderness on exam.  668ml/24h bilious  output from NGT(?ice chips).  Clamp NGT, if tolerating well we will DC the NGT and start sips of clears this evening.  He will need close surgical follow as an outpatient and may need CT enterography in the near future.  Recommend mobilizing the patient.  We will continue to follow.    Ashok Norris, [facility] Pager [phone] Office [phone]  06/10/2013 8:19 AM

## 2013-06-10 NOTE — Progress Notes (Signed)
INITIAL NUTRITION ASSESSMENT  DOCUMENTATION CODES Per approved criteria  -Severe malnutrition in the context of acute illness or injury   INTERVENTION:  Resource Breeze 3 times daily (250 kcals, 9 gm protein per 8 fl oz carton) RD to follow for nutrition care plan  NUTRITION DIAGNOSIS: Unintended weight loss related to altered GI function as evidenced by 8% weight loss  Goal: Pt to meet >/= 90% of their estimated nutrition needs   Monitor:  PO & supplemental intake, weight, labs, I/O's  Reason for Assessment: Malnutrition Screening Tool Report  62 y.o. male  Admitting Dx: Small bowel obstruction  ASSESSMENT: Patient with PMH of CVA, HTN, CAD, likely underlying DM, presented with abdominal pain ---> CT was notable for a high grade, possibly chronic small bowel obstruction due to a focally abnormal segment of jejunum with submucosal thickening.   Patient reports he was not able to take keep any food or liquid down for 1 week; he reports he's had an 12 lb weight loss in the past 2 weeks (severe for time frame); had NGT in place to LIS upon RD visit; advanced to Clear Liquids at 1029; amenable to [employer] ---> RD to order.  Patient meets criteria for severe malnutrition in the context of acute illness or injury as evidenced by < 50% intake of estimated energy requirement for > 5 days and 8% weight loss x 2 weeks.  Height: Ht Readings from Last 1 Encounters:  06/08/13 5\' 4"  (1.626 m)    Weight: Wt Readings from Last 1 Encounters:  06/08/13 148 lb 6.4 oz (67.314 kg)    Ideal Body Weight: 130 lb  % Ideal Body Weight: 114%  Wt Readings from Last 10 Encounters:  06/08/13 148 lb 6.4 oz (67.314 kg)  01/06/12 160 lb (72.576 kg)    Usual Body Weight: 160 lb  % Usual Body Weight: 92%  BMI:  Body mass index is 25.46 kg/(m^2).  Estimated Nutritional Needs: Kcal: 1800-2000 Protein: 90-100 gm Fluid: 1.8-2.0 L  Skin: Intact  Diet Order: Clear Liquid  EDUCATION  NEEDS: -No education needs identified at this time   Intake/Output Summary (Last 24 hours) at 06/10/13 1402 Last data filed at 06/10/13 1100  Gross per 24 hour  Intake      0 ml  Output   1200 ml  Net  -1200 ml    Labs:   Recent Labs Lab 06/08/13 0630 06/09/13 0525 06/10/13 0500  NA 134* 136 138  K 3.3* 3.2* 3.5  CL 98 100 101  CO2 27 29 25   BUN 9 7 5*  CREATININE 0.49* 0.48* 0.46*  CALCIUM 8.3* 8.1* 8.6  GLUCOSE 238* 197* 104*    CBG (last 3)   Recent Labs  06/10/13 0436 06/10/13 0741 06/10/13 1141  GLUCAP 95 97 95    Scheduled Meds: . antiseptic oral rinse  15 mL Mouth Rinse q12n4p  . chlorhexidine  15 mL Mouth Rinse BID  . enoxaparin (LOVENOX) injection  40 mg Subcutaneous Q24H  . insulin aspart  0-9 Units Subcutaneous Q4H  . living well with diabetes book   Does not apply Once  . sodium chloride  3 mL Intravenous Q12H    Continuous Infusions:   Past Medical History  Diagnosis Date  . SBO (small bowel obstruction) 09/2010    Resolved with conservative measures  . Lacunar infarction 2006/2007    Right basal ganglion, chronic lacunar infarct  . Hypertension     fluctuates  . CAD (coronary artery disease)  s/p MI in distant past (1995?); TTE 2010 - EF 55-60%  . Stenosis of right carotid artery   . TIA (transient ischemic attack) 06/2009  . Acute cerebral infarction 11/2005    right caudate internal capsule secondary to small vessel    Past Surgical History  Procedure Laterality Date  . Laparoscopic cholecystectomy  2009    by Dr. Derrell Lolling  . Broken collarbone    . Broken shoulder      Maureen Chatters, RD, LDN Pager #: [phone] After-Hours Pager #: [phone]

## 2013-06-10 NOTE — Progress Notes (Signed)
Informed by NT that pt. Stated he was having some blurred vision.  RN went in and checked on pt. And got CBG which was 95.  Text paged Dr. Sharl Ma and informed of above.  Return call from Dr. Sharl Ma who stated pt. Was a diabetic,  BS was 300's on admission and HgbA1c was 11.1, he will mention to pt. And check CT scan also.  Will continue to monitor and begin diabetes teaching.  Forbes Cellar, RN

## 2013-06-11 DIAGNOSIS — E119 Type 2 diabetes mellitus without complications: Secondary | ICD-10-CM

## 2013-06-11 LAB — GLUCOSE, CAPILLARY
Glucose-Capillary: 162 mg/dL — ABNORMAL HIGH (ref 70–99)
Glucose-Capillary: 168 mg/dL — ABNORMAL HIGH (ref 70–99)

## 2013-06-11 LAB — OCCULT BLOOD X 1 CARD TO LAB, STOOL: Fecal Occult Bld: NEGATIVE

## 2013-06-11 MED ORDER — GLIMEPIRIDE 1 MG PO TABS
1.0000 mg | ORAL_TABLET | Freq: Every day | ORAL | Status: DC
  Administered 2013-06-11 – 2013-06-12 (×2): 1 mg via ORAL
  Filled 2013-06-11 (×3): qty 1

## 2013-06-11 NOTE — Progress Notes (Signed)
Feels much better. BM  Abd pain resolved Diet advancing  Upon discharge, he should followup with me at [facility] in 2-3 weeks.  Wilmon Arms. Corliss Skains, MD, [facility]  General/ Trauma Surgery  06/11/2013 1:33 PM

## 2013-06-11 NOTE — Progress Notes (Signed)
Pt had a medium loose bowel movement that was pale yellow with red streaks. MD on call notified, orders to collect a hemoccult given. Will continue to monitor.

## 2013-06-11 NOTE — Plan of Care (Signed)
Problem: Food- and Nutrition-Related Knowledge Deficit (NB-1.1) Goal: Nutrition education Formal process to instruct or train a patient/client in a skill or to impart knowledge to help patients/clients voluntarily manage or modify food choices and eating behavior to maintain or improve health. Outcome: Completed/Met Date Met:  06/11/13  RD consulted for nutrition education regarding diabetes.     Lab Results  Component Value Date    HGBA1C 11.1* 06/08/2013    RD provided "Carbohydrate Counting for People with Diabetes" handout from the Academy of [employer] and Dietetics. Discussed different food groups and their effects on blood sugar, emphasizing carbohydrate-containing foods. Provided list of carbohydrates and recommended serving sizes of common foods.  Discussed importance of controlled and consistent carbohydrate intake throughout the day. Provided examples of ways to balance meals/snacks and encouraged intake of high-fiber, whole grain complex carbohydrates. Teach back method used.  Expect fair compliance.  Please re-consult RD should patient have questions and/or concerns.  Maureen Chatters, RD, LDN Pager #: [phone] After-Hours Pager #: [phone]

## 2013-06-11 NOTE — Plan of Care (Signed)
Problem: Consults Goal: Diagnosis-Diabetes Mellitus New Onset Type II   Problem: Phase I Progression Outcomes Goal: Initial discharge plan identified Outcome: Completed/Met Date Met:  06/11/13 To return home with wife  Problem: Discharge Progression Outcomes Goal: Obtain signed CBG meter Rx form Outcome: Completed/Met Date Met:  06/11/13 In shadow chart

## 2013-06-11 NOTE — Progress Notes (Signed)
Subjective: Pt states that his pain is much improved, he denies nausea and vomiting with clear liquid diet. + flatus and BM  Objective: Vital signs in last 24 hours: Temp:  [98.4 F (36.9 C)-98.6 F (37 C)] 98.4 F (36.9 C) (08/20 0515) Pulse Rate:  [71-79] 79 (08/20 0515) Resp:  [18-20] 18 (08/20 0515) BP: (116-142)/(71-94) 116/71 mmHg (08/20 0515) SpO2:  [96 %] 96 % (08/20 0515) Last BM Date: 06/11/13  Intake/Output from previous day: 08/19 0701 - 08/20 0700 In: -  Out: 300 [Urine:300] Intake/Output this shift: Total I/O In: 120 [P.O.:120] Out: -   PE: Gen: NAD, alert, cooperative with exam HEENT: NCAT CV: RRR, good S1/S2, no murmur Resp: CTABL, no wheezes, non-labored Abd: Soft, mild tenderness to palpation of LLQ, + BS, no guarding  Ext: No edema, warm  Lab Results:   Recent Labs  06/10/13 0500  WBC 8.0  HGB 14.4  HCT 39.2  PLT 368   BMET  Recent Labs  06/09/13 0525 06/10/13 0500  NA 136 138  K 3.2* 3.5  CL 100 101  CO2 29 25  GLUCOSE 197* 104*  BUN 7 5*  CREATININE 0.48* 0.46*  CALCIUM 8.1* 8.6   PT/INR No results found for this basename: LABPROT, INR,  in the last 72 hours CMP     Component Value Date/Time   NA 138 06/10/2013 0500   K 3.5 06/10/2013 0500   CL 101 06/10/2013 0500   CO2 25 06/10/2013 0500   GLUCOSE 104* 06/10/2013 0500   BUN 5* 06/10/2013 0500   CREATININE 0.46* 06/10/2013 0500   CALCIUM 8.6 06/10/2013 0500   PROT 6.6 06/07/2013 2155   ALBUMIN 3.0* 06/07/2013 2155   AST 30 06/07/2013 2155   ALT 37 06/07/2013 2155   ALKPHOS 88 06/07/2013 2155   BILITOT 1.3* 06/07/2013 2155   GFRNONAA >90 06/10/2013 0500   GFRAA >90 06/10/2013 0500   Lipase     Component Value Date/Time   LIPASE 17 06/07/2013 2155       Studies/Results: Ct Head Wo Contrast  06/10/2013   *RADIOLOGY REPORT*  Clinical Data: Blurry vision  CT HEAD WITHOUT CONTRAST  Technique:  Contiguous axial images were obtained from the base of the skull through the vertex  without contrast.  Comparison: 01/07/2012  Findings: Remote lacunar infarcts in the right basal ganglia is identified and appears unchanged from previous exam. There is prominence of the sulci and ventricles consistent with brain atrophy.  There is no evidence for acute brain infarct, hemorrhage or mass.  The paranasal sinuses and mastoid air cells are clear.  The skull is intact.  IMPRESSION:  1.  No acute intracranial abnormalities. 2.  Atrophy. 3.  Chronic right basal ganglia lacunar infarcts.   Original Report Authenticated By: Signa Kell, M.D.   Dg Small Bowel  06/09/2013   *RADIOLOGY REPORT*  Clinical Data:A partial small-bowel obstruction  SMALL BOWEL SERIES  Technique:  Following ingestion of a mixture of thin barium and EnteroVu, serial small bowel images were obtained including spot views of the terminal ileum.  Fluoroscopy Time: 1 minute, 29 seconds  Comparison: 06/07/2013  Findings: Contrast was administered by nasogastric tube.  A 15-minute images, there is filling to the proximal jejunum, with loops measuring up to 4.6 cm in diameter.  There is a small amount of contrast in the distal colon from prior imaging studies.  By 45 minutes, further filling of the jejunum is observed, with folds loops measuring up to 5.1 cm, and  with normal fold pattern.  By 2 hours 15 minutes, there is partial filling of the ileum.  The proximal ileum is mildly dilated.  By 4 hours, contrast has made its way through to the colon. Proximal loops are more dilated than distal loops, and the terminal ileum appears unremarkable. The prior area of wall thickening in the left upper quadrant jejunum shown on prior CT scan is not abnormal  currently, and may have been peristaltic or may represent improved inflammation.  A specific lesion accounting for the suspected small bowel obstruction is not currently identified.  IMPRESSION:  1.  Although they are moderately dilated loops of small bowel, and distal loops appear less  dilated than the jejunum, we do not demonstrate a jejunal wall thickening in the region of concern on the prior exam or a specific lead point for obstruction.  Transit time to the small bowel was 4 hours, mildly abnormally dilated. Overall small bowel fold pattern unremarkable.  The appearance may reflect improving partial small-bowel obstruction or ileus.   Original Report Authenticated By: Gaylyn Rong, M.D.    Anti-infectives: Anti-infectives   None       Assessment/Plan 1. PSBO due to jejunal thickening  2. H/o MI  3. H/O CVA  4. HTN  - SBO likely resolved with nearly complete resolution of pain and tolerating clears well.  - Advance diet to full liquid - Needs close OP follow up with CT enterography or pill camera to evaluate his SB.     LOS: 4 days    Kevin Fenton 06/11/2013, 9:41 AM Pager: [phone]

## 2013-06-11 NOTE — Progress Notes (Signed)
[facility] PROGRESS NOTE Subjective Eating w/o problems no distension or pain  Objective: Vital signs in last 24 hours: Temp:  [98.4 F (36.9 C)-98.6 F (37 C)] 98.4 F (36.9 C) (08/20 0515) Pulse Rate:  [71-79] 79 (08/20 0515) Resp:  [18-20] 18 (08/20 0515) BP: (116-142)/(71-94) 116/71 mmHg (08/20 0515) SpO2:  [96 %] 96 % (08/20 0515) Last BM Date: 06/11/13  Intake/Output from previous day: 08/19 0701 - 08/20 0700 In: -  Out: 300 [Urine:300] Intake/Output this shift:    PE:  Abdomen--soft, nontender  Lab Results:  Recent Labs  06/10/13 0500  WBC 8.0  HGB 14.4  HCT 39.2  PLT 368   BMET  Recent Labs  06/09/13 0525 06/10/13 0500  NA 136 138  K 3.2* 3.5  CL 100 101  CO2 29 25  CREATININE 0.48* 0.46*   LFT No results found for this basename: PROT, AST, ALT, ALKPHOS, BILITOT, BILIDIR, IBILI,  in the last 72 hours PT/INR No results found for this basename: LABPROT, INR,  in the last 72 hours PANCREAS No results found for this basename: LIPASE,  in the last 72 hours       Studies/Results: Ct Head Wo Contrast  06/10/2013   *RADIOLOGY REPORT*  Clinical Data: Blurry vision  CT HEAD WITHOUT CONTRAST  Technique:  Contiguous axial images were obtained from the base of the skull through the vertex without contrast.  Comparison: 01/07/2012  Findings: Remote lacunar infarcts in the right basal ganglia is identified and appears unchanged from previous exam. There is prominence of the sulci and ventricles consistent with brain atrophy.  There is no evidence for acute brain infarct, hemorrhage or mass.  The paranasal sinuses and mastoid air cells are clear.  The skull is intact.  IMPRESSION:  1.  No acute intracranial abnormalities. 2.  Atrophy. 3.  Chronic right basal ganglia lacunar infarcts.   Original Report Authenticated By: Signa Kell, M.D.   Dg Small Bowel  06/09/2013   *RADIOLOGY REPORT*  Clinical Data:A partial small-bowel obstruction  SMALL  BOWEL SERIES  Technique:  Following ingestion of a mixture of thin barium and EnteroVu, serial small bowel images were obtained including spot views of the terminal ileum.  Fluoroscopy Time: 1 minute, 29 seconds  Comparison: 06/07/2013  Findings: Contrast was administered by nasogastric tube.  A 15-minute images, there is filling to the proximal jejunum, with loops measuring up to 4.6 cm in diameter.  There is a small amount of contrast in the distal colon from prior imaging studies.  By 45 minutes, further filling of the jejunum is observed, with folds loops measuring up to 5.1 cm, and with normal fold pattern.  By 2 hours 15 minutes, there is partial filling of the ileum.  The proximal ileum is mildly dilated.  By 4 hours, contrast has made its way through to the colon. Proximal loops are more dilated than distal loops, and the terminal ileum appears unremarkable. The prior area of wall thickening in the left upper quadrant jejunum shown on prior CT scan is not abnormal  currently, and may have been peristaltic or may represent improved inflammation.  A specific lesion accounting for the suspected small bowel obstruction is not currently identified.  IMPRESSION:  1.  Although they are moderately dilated loops of small bowel, and distal loops appear less dilated than the jejunum, we do not demonstrate a jejunal wall thickening in the region of concern on the prior exam or a specific lead point for obstruction.  Transit time  to the small bowel was 4 hours, mildly abnormally dilated. Overall small bowel fold pattern unremarkable.  The appearance may reflect improving partial small-bowel obstruction or ileus.   Original Report Authenticated By: Gaylyn Rong, M.D.    Medications: I have reviewed the patient's current medications.  Assessment/Plan: 1. Prox SBO. Appears to have resolved. Would advance diet and discharge if tolerates F/U as OP   Allannah Kempen JR,Camari Quintanilla L 06/11/2013, 8:35 AM

## 2013-06-11 NOTE — Progress Notes (Signed)
Inpatient Diabetes Program Recommendations  AACE/ADA: New Consensus Statement on Inpatient Glycemic Control (2013)  Target Ranges:  Prepandial:   less than 140 mg/dL      Peak postprandial:   less than 180 mg/dL (1-2 hours)      Critically ill patients:  140 - 180 mg/dL   Reason for Visit: Diabetes Consult - Newly-diagnosed  62 year old white male admitted with abdominal pain. Diagnosed with DM this hospital admission with HgbA1C of 11.1%.  Discussed diagnosis of DM, diet, exercise, glucose monitoring, hypoglycemia s/s and treatment.  Pt has viewed diabetes videos on pt ed channel and states he is very motivated to "get this diabetes in control." States he lost 17 pounds in the past couple of weeks. Walks 2 miles/day and "eats whatever I want."  No PCP prior to admission.  To f/u with [facility] MDs. Discussed importance of checking blood sugars and taking logbook to MD visit.  Will order OP Diabetes Education for newly diagnosed DM. Rx for glucose meter on shadow chart.  Will f/u in am prior to discharge for any questions. Results for LAZER, WOLLARD (MRN [id number]) as of 06/11/2013 15:57  Ref. Range 06/11/2013 00:05 06/11/2013 03:59 06/11/2013 07:48 06/11/2013 12:34  Glucose-Capillary Latest Range: 70-99 mg/dL 657 (H) 846 (H) 962 (H) 203 (H)  Results for CHAY, MAZZONI (MRN [id number]) as of 06/11/2013 15:57  Ref. Range 06/08/2013 06:30  Hemoglobin A1C Latest Range: <5.7 % 11.1 (H)    Thank you. Ailene Ards, RD, LDN, CDE Inpatient Diabetes Coordinator [phone]

## 2013-06-11 NOTE — Progress Notes (Signed)
[facility] PROGRESS NOTE  [name] WUJ:[id number] DOB: 02-May-1951 DOA: 06/07/2013 PCP: No PCP Per Patient  Interim summary 62 yr. Old WM w/ hx SBO (resolved with conservative therapy years ago), hx CVA, HTN, CAD, likely underlying DM, presents with abdominal pain. He states he had gradual onset of abdominal pain this past Monday. He states he then began to have repeated episodes of N/V/D that have only worsened. He states he came to the ED three days ago and was sent some and told to increase his PO intake.  He states his abdomen has been increasingly distended and he has not been able to keep any food or liquids down. He denies fever, chills or any recent sick contacts. He has a hx of a lap chole years ago which he states occurred due to cholecystitis with rupture.  CT of the abdomen in the ED is notable for a high grade, possibly chronic small bowel obstruction due to a focally abnormal segment of jejunum with submucosal thickening. Patient was seen by GI and surgery and underwent small bowel follow-through. At this time obstruction has resolved NG tube was clamped and patient is started on clear liquid diet. The diet will be advanced if tolerated. He'll follow up as outpatient for possible CT enterography or pill camera to evaluate small bowel obstruction. Patient is also new onset diabetic, will need Lantus and metformin at the time of discharge.  Assessment/Plan: Chronic small bowel obstruction- resolved. Advance diet. dishcarge Friday if stable.  Will follow-up as outpatient with either CT enterography or pill camera to evaluate SB.   1. Blurred vision- CT brain negative 2. Diabetes mellitus- start amaryl rather than metfomin due to less GI side effects. Diabetic teaching. Needs PCP for outpatient f/u 3. Hypokalemia- Potassium replaced. 4. DVT prophylaxis- lovenox  Code Status: Full Family Communication: *No family at bedside Disposition Plan:  Home when  stable   Consultants:  GI  surgery  Procedures:  None  Antibiotics:  *None  HPI/Subjective: No N/V. Having stools. No pain  Objective: Filed Vitals:   06/11/13 0515  BP: 116/71  Pulse: 79  Temp: 98.4 F (36.9 C)  Resp: 18    Intake/Output Summary (Last 24 hours) at 06/11/13 1320 Last data filed at 06/11/13 9562  Gross per 24 hour  Intake    120 ml  Output      0 ml  Net    120 ml   Filed Weights   06/08/13 0341 06/08/13 0356  Weight: 67.314 kg (148 lb 6.4 oz) 67.314 kg (148 lb 6.4 oz)    Exam:   General:  Appear in no acute distress  Cardiovascular: S1s2 RRR  Respiratory: Clear bilaterally  Abdomen: Soft, nondistended, nontender  Musculoskeletal: No edema  Data Reviewed: Basic Metabolic Panel:  Recent Labs Lab 06/04/13 1756 06/07/13 2155 06/08/13 0630 06/09/13 0525 06/10/13 0500  NA 128* 134* 134* 136 138  K 3.6 3.5 3.3* 3.2* 3.5  CL 92* 96 98 100 101  CO2 25 26 27 29 25   GLUCOSE 307* 224* 238* 197* 104*  BUN 10 10 9 7  5*  CREATININE 0.58 0.50 0.49* 0.48* 0.46*  CALCIUM 9.0 8.1* 8.3* 8.1* 8.6   Liver Function Tests:  Recent Labs Lab 06/04/13 1756 06/07/13 2155  AST 27 30  ALT 46 37  ALKPHOS 120* 88  BILITOT 1.4* 1.3*  PROT 7.8 6.6  ALBUMIN 3.8 3.0*    Recent Labs Lab 06/04/13 1756 06/07/13 2155  LIPASE 25 17   No  results found for this basename: AMMONIA,  in the last 168 hours CBC:  Recent Labs Lab 06/04/13 1756 06/07/13 2155 06/08/13 0630 06/10/13 0500  WBC 10.0 9.0 5.9 8.0  NEUTROABS 7.2 7.2  --   --   HGB 16.3 14.9 13.9 14.4  HCT 44.1 40.9 38.3* 39.2  MCV 88.4 88.1 88.5 88.3  PLT 275 285 274 368   Cardiac Enzymes: No results found for this basename: CKTOTAL, CKMB, CKMBINDEX, TROPONINI,  in the last 168 hours BNP (last 3 results) No results found for this basename: PROBNP,  in the last 8760 hours CBG:  Recent Labs Lab 06/10/13 2001 06/11/13 0005 06/11/13 0359 06/11/13 0748 06/11/13 1234   GLUCAP 219* 168* 162* 141* 203*    Recent Results (from the past 240 hour(s))  URINE CULTURE     Status: None   Collection Time    06/04/13  7:20 PM      Result Value Range Status   Specimen Description URINE, RANDOM   Final   Special Requests NONE   Final   Culture  Setup Time     Final   Value: 06/04/2013 23:15     Performed at [facility] Count     Final   Value: NO GROWTH     Performed at [facility]   Culture     Final   Value: NO GROWTH     Performed at [facility]   Report Status 06/06/2013 FINAL   Final     Studies: Ct Head Wo Contrast  06/10/2013   *RADIOLOGY REPORT*  Clinical Data: Blurry vision  CT HEAD WITHOUT CONTRAST  Technique:  Contiguous axial images were obtained from the base of the skull through the vertex without contrast.  Comparison: 01/07/2012  Findings: Remote lacunar infarcts in the right basal ganglia is identified and appears unchanged from previous exam. There is prominence of the sulci and ventricles consistent with brain atrophy.  There is no evidence for acute brain infarct, hemorrhage or mass.  The paranasal sinuses and mastoid air cells are clear.  The skull is intact.  IMPRESSION:  1.  No acute intracranial abnormalities. 2.  Atrophy. 3.  Chronic right basal ganglia lacunar infarcts.   Original Report Authenticated By: Signa Kell, M.D.   Dg Small Bowel  06/09/2013   *RADIOLOGY REPORT*  Clinical Data:A partial small-bowel obstruction  SMALL BOWEL SERIES  Technique:  Following ingestion of a mixture of thin barium and EnteroVu, serial small bowel images were obtained including spot views of the terminal ileum.  Fluoroscopy Time: 1 minute, 29 seconds  Comparison: 06/07/2013  Findings: Contrast was administered by nasogastric tube.  A 15-minute images, there is filling to the proximal jejunum, with loops measuring up to 4.6 cm in diameter.  There is a small amount of contrast in the distal colon from prior imaging  studies.  By 45 minutes, further filling of the jejunum is observed, with folds loops measuring up to 5.1 cm, and with normal fold pattern.  By 2 hours 15 minutes, there is partial filling of the ileum.  The proximal ileum is mildly dilated.  By 4 hours, contrast has made its way through to the colon. Proximal loops are more dilated than distal loops, and the terminal ileum appears unremarkable. The prior area of wall thickening in the left upper quadrant jejunum shown on prior CT scan is not abnormal  currently, and may have been peristaltic or may represent improved inflammation.  A  specific lesion accounting for the suspected small bowel obstruction is not currently identified.  IMPRESSION:  1.  Although they are moderately dilated loops of small bowel, and distal loops appear less dilated than the jejunum, we do not demonstrate a jejunal wall thickening in the region of concern on the prior exam or a specific lead point for obstruction.  Transit time to the small bowel was 4 hours, mildly abnormally dilated. Overall small bowel fold pattern unremarkable.  The appearance may reflect improving partial small-bowel obstruction or ileus.   Original Report Authenticated By: Gaylyn Rong, M.D.    Scheduled Meds: . antiseptic oral rinse  15 mL Mouth Rinse q12n4p  . chlorhexidine  15 mL Mouth Rinse BID  . enoxaparin (LOVENOX) injection  40 mg Subcutaneous Q24H  . feeding supplement  1 Container Oral TID BM  . insulin aspart  0-9 Units Subcutaneous Q4H  . sodium chloride  3 mL Intravenous Q12H   Continuous Infusions:  Time spent: 25 min  Jamail Cullers L  [facility] Pager [phone]. If 7PM-7AM, please contact night-coverage at [email or web address], password [facility] 06/11/2013, 1:20 PM  LOS: 4 days

## 2013-06-12 LAB — GLUCOSE, CAPILLARY
Glucose-Capillary: 153 mg/dL — ABNORMAL HIGH (ref 70–99)
Glucose-Capillary: 226 mg/dL — ABNORMAL HIGH (ref 70–99)
Glucose-Capillary: 288 mg/dL — ABNORMAL HIGH (ref 70–99)

## 2013-06-12 MED ORDER — ACETAMINOPHEN 325 MG PO TABS
650.0000 mg | ORAL_TABLET | Freq: Four times a day (QID) | ORAL | Status: DC | PRN
  Administered 2013-06-12: 650 mg via ORAL
  Filled 2013-06-12: qty 2

## 2013-06-12 MED ORDER — GLIMEPIRIDE 1 MG PO TABS: 1.0000 mg | ORAL_TABLET | Freq: Every day | ORAL | Status: DC

## 2013-06-12 NOTE — Progress Notes (Signed)
  Subjective: Pt denies abd pain and state sthat he is tolerating regular diet without nausea, vomiting, or abd pain. + BM and flatus.   Objective: Vital signs in last 24 hours: Temp:  [98.3 F (36.8 C)-98.6 F (37 C)] 98.6 F (37 C) (08/21 0519) Pulse Rate:  [64-80] 64 (08/21 0519) Resp:  [18] 18 (08/21 0519) BP: (114-132)/(71-84) 114/73 mmHg (08/21 0519) SpO2:  [96 %-98 %] 98 % (08/21 0519) Last BM Date: 06/11/13  Intake/Output from previous day: 08/20 0701 - 08/21 0700 In: 120 [P.O.:120] Out: -  Intake/Output this shift:    PE: Gen: NAD, alert, cooperative with exam HEENT: NCAT CV: RRR, good S1/S2, no murmur Resp: CTABL, no wheezes, non-labored Abd: SNTND, + BS, No guarding     Lab Results:   Recent Labs  06/10/13 0500  WBC 8.0  HGB 14.4  HCT 39.2  PLT 368   BMET  Recent Labs  06/10/13 0500  NA 138  K 3.5  CL 101  CO2 25  GLUCOSE 104*  BUN 5*  CREATININE 0.46*  CALCIUM 8.6   PT/INR No results found for this basename: LABPROT, INR,  in the last 72 hours CMP     Component Value Date/Time   NA 138 06/10/2013 0500   K 3.5 06/10/2013 0500   CL 101 06/10/2013 0500   CO2 25 06/10/2013 0500   GLUCOSE 104* 06/10/2013 0500   BUN 5* 06/10/2013 0500   CREATININE 0.46* 06/10/2013 0500   CALCIUM 8.6 06/10/2013 0500   PROT 6.6 06/07/2013 2155   ALBUMIN 3.0* 06/07/2013 2155   AST 30 06/07/2013 2155   ALT 37 06/07/2013 2155   ALKPHOS 88 06/07/2013 2155   BILITOT 1.3* 06/07/2013 2155   GFRNONAA >90 06/10/2013 0500   GFRAA >90 06/10/2013 0500   Lipase     Component Value Date/Time   LIPASE 17 06/07/2013 2155       Studies/Results: Ct Head Wo Contrast  06/10/2013   *RADIOLOGY REPORT*  Clinical Data: Blurry vision  CT HEAD WITHOUT CONTRAST  Technique:  Contiguous axial images were obtained from the base of the skull through the vertex without contrast.  Comparison: 01/07/2012  Findings: Remote lacunar infarcts in the right basal ganglia is identified and appears  unchanged from previous exam. There is prominence of the sulci and ventricles consistent with brain atrophy.  There is no evidence for acute brain infarct, hemorrhage or mass.  The paranasal sinuses and mastoid air cells are clear.  The skull is intact.  IMPRESSION:  1.  No acute intracranial abnormalities. 2.  Atrophy. 3.  Chronic right basal ganglia lacunar infarcts.   Original Report Authenticated By: Signa Kell, M.D.    Anti-infectives: Anti-infectives   None       Assessment/Plan 1. PSBO due to jejunal thickening  2. H/o MI  3. H/O CVA  4. HTN  - SBO likely resolved with complete resolution of pain and tolerating regular diet - Needs follow up with Dr. Corliss Skains in clinic in 2-3 weeks at [facility]    LOS: 5 days    [name] 06/12/2013, 8:41 AM Pager: [phone]

## 2013-06-12 NOTE — Discharge Summary (Signed)
Physician Discharge Summary  [name] ZOX:[id number] DOB: 25-Mar-1951 DOA: 06/07/2013  PCP: No PCP Per Patient  Admit date: 06/07/2013 Discharge date: 06/12/2013  Time spent: greater than 30 minutes  Recommendations for Outpatient Follow-up:  1. CT enterography or Capsule endoscopy  Discharge Diagnoses:  Principal Problem:   Small bowel obstruction Active Problems:   Vomiting   DM type 2 (diabetes mellitus, type 2), new diagnosis  Discharge Condition: diabetic  Filed Weights   06/08/13 0341 06/08/13 0356  Weight: 67.314 kg (148 lb 6.4 oz) 67.314 kg (148 lb 6.4 oz)    History of present illness:  62 yr. Old WM w/ hx SBO (resolved with conservative therapy years ago), hx CVA, HTN, CAD, likely underlying DM, presents with abdominal pain. He states he had gradual onset of abdominal pain this past Monday. He states he then began to have repeated episodes of N/V/D that have only worsened. He states he came to the ED three days ago and was sent some and told to increase his PO intake.  He states his abdomen has been increasingly distended and he has not been able to keep any food or liquids down. He denies fever, chills or any recent sick contacts. He has a hx of a lap chole years ago which he states occurred due to cholecystitis with rupture.  CT of the abdomen in the ED is notable for a high grade, possibly chronic small bowel obstruction due to a focally abnormal segment of jejunum with submucosal thickening.   Hospital Course:  62 yr. Old WM w/ hx SBO (resolved with conservative therapy years ago), hx CVA, HTN, CAD, newly diagnosed DM, admitted with SBO. CT of the abdomen in the ED is notable for a high grade, possibly chronic small bowel obstruction due to a focally abnormal segment of jejunum with submucosal thickening. Patient responded to NG decompression. He started having stools and his diet was advanced to solids. He has no pain currently and is not bloated or distended. Patient  was seen by GI and surgery and underwent small bowel follow-through which did not show any jejunal thickening seen on CAT scan. He will followup with GI and surgery as an outpatient. To have small bowel enteroscopy versus capsule endoscopy as an outpatient.  His hemoglobin A1c is 11 and he has no previous history of diabetes. He was maintained on sliding scale insulin here. He will go home on Amaryl and followup with primary care to adjust. Previously had no PCP but this has been arranged by care management. He has been given diabetic teaching while here.  Procedures:  none  Consultations:  [facility]  General Surgery  Discharge Exam: Filed Vitals:   06/12/13 0519  BP: 114/73  Pulse: 64  Temp: 98.6 F (37 C)  Resp: 18    General: comfortable Abd: s, nt, nd. Bowel sounds present  Discharge Instructions  Discharge Orders   Future Orders Complete By Expires   Activity as tolerated - No restrictions  As directed    Diet Carb Modified  As directed        Medication List         aspirin EC 81 MG tablet  Take 81 mg by mouth daily. For pain     glimepiride 1 MG tablet  Commonly known as:  AMARYL  Take 1 tablet (1 mg total) by mouth daily with breakfast.       Allergies  Allergen Reactions  . Bee Venom Anaphylaxis  . Penicillins Anaphylaxis  Follow-up Information   Follow up with Darrow Bussing, MD On 06/18/2013. (3:15, please bring insurance card for pcp)    Specialty:  Family Medicine   Contact information:   [address] [place] [address] [phone]       Follow up with Wynona Luna., MD. Schedule an appointment as soon as possible for a visit in 2 weeks.   Specialty:  General Surgery   Contact information:   [address] [place] [address] [phone]       Follow up with Vertell Novak, MD.   Specialty:  Gastroenterology   Contact information:   [address]                          [place] [address] [phone]        The results of significant diagnostics from this hospitalization (including imaging, microbiology, ancillary and laboratory) are listed below for reference.    Significant Diagnostic Studies: Ct Head Wo Contrast  06/10/2013   *RADIOLOGY REPORT*  Clinical Data: Blurry vision  CT HEAD WITHOUT CONTRAST  Technique:  Contiguous axial images were obtained from the base of the skull through the vertex without contrast.  Comparison: 01/07/2012  Findings: Remote lacunar infarcts in the right basal ganglia is identified and appears unchanged from previous exam. There is prominence of the sulci and ventricles consistent with brain atrophy.  There is no evidence for acute brain infarct, hemorrhage or mass.  The paranasal sinuses and mastoid air cells are clear.  The skull is intact.  IMPRESSION:  1.  No acute intracranial abnormalities. 2.  Atrophy. 3.  Chronic right basal ganglia lacunar infarcts.   Original Report Authenticated By: Signa Kell, M.D.   Ct Abdomen Pelvis W Contrast  06/08/2013   *RADIOLOGY REPORT*  Clinical Data: Abdominal pain  CT ABDOMEN AND PELVIS WITH CONTRAST  Technique:  Multidetector CT imaging of the abdomen and pelvis was performed following the standard protocol during bolus administration of intravenous contrast.  Contrast: OMNIPAQUE IOHEXOL 300 MG/ML  SOLN  Comparison: Most recent prior CT abdomen/pelvis 09/23/2010  Findings:  Lower Chest:  Respiratory motion limits evaluation of the lung bases.  No focal airspace consolidation or definite pulmonary nodule.  Mild dependent atelectasis noted bilaterally.  Mild cardiomegaly.  No pericardial effusion.  Unremarkable visualized distal thoracic esophagus.  Abdomen: Unremarkable CT appearance of the stomach, duodenum, spleen, adrenal glands and pancreas.  Diffuse hypoattenuation of the hepatic parenchyma consistent with hepatic steatosis.  No discrete hepatic lesion identified.   Surgical changes of prior cholecystectomy.  No intra or extrahepatic biliary ductal dilatation.  Symmetric renal parenchymal enhancement bilaterally.  No hydronephrosis, nephrolithiasis or enhancing renal mass.  Unremarkable colon and terminal ileum.  The appendix is not identified and may be surgically absent.  No inflammatory change in the right lower quadrant.  Multiple loops of dilated and fluid- filled small bowel noted in the mid abdomen.  There is a focal lesion abnormal segment of the small bowel to the left of midline in the mid abdomen which demonstrates diffuse submucosal wall thickening and narrowing of the bowel lumen.  Overall, findings are consistent with high-grade chronic partial small bowel obstruction. No free fluid, or free air.  Pelvis: Unremarkable bladder, prostate gland and seminal vesicles. No free fluid or suspicious adenopathy.  Bones: No acute fracture or aggressive appearing lytic or blastic osseous lesion.  Vascular: No acute vascular abnormality  or significant atherosclerotic vascular disease.  IMPRESSION:  1.  CT findings are most consistent with high-grade and likely chronic partial small bowel obstruction secondary to a focally abnormal segment of jejunum which demonstrates submucosal thickening and stenosis of the bowel lumen.  Differential considerations include acute enteritis, or chronic post inflammatory stenosis.  Query clinical history of Crohn disease or other inflammatory enteritis.  An infectious enteritis is also possible, but considered less likely given the similar appearance on the CT scan from December 2011.  2.  Cardiomegaly  3.  Hepatic steatosis  4.  Surgical changes of prior cholecystectomy and probable appendectomy   Original Report Authenticated By: Malachy Moan, M.D.   Dg Small Bowel  06/09/2013   *RADIOLOGY REPORT*  Clinical Data:A partial small-bowel obstruction  SMALL BOWEL SERIES  Technique:  Following ingestion of a mixture of thin barium and  EnteroVu, serial small bowel images were obtained including spot views of the terminal ileum.  Fluoroscopy Time: 1 minute, 29 seconds  Comparison: 06/07/2013  Findings: Contrast was administered by nasogastric tube.  A 15-minute images, there is filling to the proximal jejunum, with loops measuring up to 4.6 cm in diameter.  There is a small amount of contrast in the distal colon from prior imaging studies.  By 45 minutes, further filling of the jejunum is observed, with folds loops measuring up to 5.1 cm, and with normal fold pattern.  By 2 hours 15 minutes, there is partial filling of the ileum.  The proximal ileum is mildly dilated.  By 4 hours, contrast has made its way through to the colon. Proximal loops are more dilated than distal loops, and the terminal ileum appears unremarkable. The prior area of wall thickening in the left upper quadrant jejunum shown on prior CT scan is not abnormal  currently, and may have been peristaltic or may represent improved inflammation.  A specific lesion accounting for the suspected small bowel obstruction is not currently identified.  IMPRESSION:  1.  Although they are moderately dilated loops of small bowel, and distal loops appear less dilated than the jejunum, we do not demonstrate a jejunal wall thickening in the region of concern on the prior exam or a specific lead point for obstruction.  Transit time to the small bowel was 4 hours, mildly abnormally dilated. Overall small bowel fold pattern unremarkable.  The appearance may reflect improving partial small-bowel obstruction or ileus.   Original Report Authenticated By: Gaylyn Rong, M.D.   Dg Abd 2 Views  06/09/2013   *RADIOLOGY REPORT*  Clinical Data:  Abdominal pain, follow small bowel obstruction  ABDOMEN - 2 VIEW  Comparison: Multiple prior studies including CT scan 06/07/2013  Findings: There is an NG tube projecting over the stomach.  There is no free air.  Oral contrast from recent CT scan is seen into  the rectum.  There are loops of dilated proximal small bowel in the right and left upper quadrants, dilated up to 5.1 cm.  IMPRESSION: Similar findings as on recent CT scan consistent with partial small bowel obstruction proximally.   Original Report Authenticated By: Esperanza Heir, M.D.    Microbiology: Recent Results (from the past 240 hour(s))  URINE CULTURE     Status: None   Collection Time    06/04/13  7:20 PM      Result Value Range Status   Specimen Description URINE, RANDOM   Final   Special Requests NONE   Final   Culture  Setup Time  Final   Value: 06/04/2013 23:15     Performed at [facility] Count     Final   Value: NO GROWTH     Performed at [facility]   Culture     Final   Value: NO GROWTH     Performed at [facility]   Report Status 06/06/2013 FINAL   Final     Labs: Basic Metabolic Panel:  Recent Labs Lab 06/07/13 2155 06/08/13 0630 06/09/13 0525 06/10/13 0500  NA 134* 134* 136 138  K 3.5 3.3* 3.2* 3.5  CL 96 98 100 101  CO2 26 27 29 25   GLUCOSE 224* 238* 197* 104*  BUN 10 9 7  5*  CREATININE 0.50 0.49* 0.48* 0.46*  CALCIUM 8.1* 8.3* 8.1* 8.6   Liver Function Tests:  Recent Labs Lab 06/07/13 2155  AST 30  ALT 37  ALKPHOS 88  BILITOT 1.3*  PROT 6.6  ALBUMIN 3.0*    Recent Labs Lab 06/07/13 2155  LIPASE 17   No results found for this basename: AMMONIA,  in the last 168 hours CBC:  Recent Labs Lab 06/07/13 2155 06/08/13 0630 06/10/13 0500  WBC 9.0 5.9 8.0  NEUTROABS 7.2  --   --   HGB 14.9 13.9 14.4  HCT 40.9 38.3* 39.2  MCV 88.1 88.5 88.3  PLT 285 274 368   Cardiac Enzymes: No results found for this basename: CKTOTAL, CKMB, CKMBINDEX, TROPONINI,  in the last 168 hours BNP: BNP (last 3 results) No results found for this basename: PROBNP,  in the last 8760 hours CBG:  Recent Labs Lab 06/11/13 1602 06/11/13 2045 06/12/13 0038 06/12/13 0414 06/12/13 0741  GLUCAP 202* 142* 226*  153* 159*    Signed:  Carter Kassel L  [facility] 06/12/2013, 10:56 AM

## 2013-06-12 NOTE — Progress Notes (Signed)
Inpatient Diabetes Program Recommendations  AACE/ADA: New Consensus Statement on Inpatient Glycemic Control (2013)  Target Ranges:  Prepandial:   less than 140 mg/dL      Peak postprandial:   less than 180 mg/dL (1-2 hours)      Critically ill patients:  140 - 180 mg/dL   Reason for Visit: F/U visit\  Pt to be discharged this afternoon to home.  Reviewed glucose monitoring, diet principles, and diabetes meds.  Pt verbalized understanding.  Has appt next week with [facility] for management of DM.  Pt seems eager to learn and expect good compliance.  Will order OP Diabetes Education for newly diagnosed DM.  Good control this am.  Results for NYGEL, PROKOP (MRN [id number]) as of 06/12/2013 12:28  Ref. Range 06/11/2013 20:45 06/12/2013 00:38 06/12/2013 04:14 06/12/2013 07:41 06/12/2013 11:32  Glucose-Capillary Latest Range: 70-99 mg/dL 409 (H) 811 (H) 914 (H) 159 (H) 288 (H)    Thank you. Ailene Ards, RD, LDN, CDE Inpatient Diabetes Coordinator [phone]

## 2013-06-12 NOTE — Progress Notes (Signed)
Patient back to normal. Ready for discharge. Follow-up with me as outpatient.  Wilmon Arms. Corliss Skains, MD, [facility] Surgery  General/ Trauma Surgery  06/12/2013 9:06 AM

## 2013-06-12 NOTE — Progress Notes (Signed)
[name] to be D/C'd Home per MD order.  Discussed with the patient and all questions fully answered. All Diabetic education completed.     Medication List         aspirin EC 81 MG tablet  Take 81 mg by mouth daily. For pain     glimepiride 1 MG tablet  Commonly known as:  AMARYL  Take 1 tablet (1 mg total) by mouth daily with breakfast.        VVS, Skin clean, dry and intact without evidence of skin break down, no evidence of skin tears noted. IV catheter discontinued intact. Site without signs and symptoms of complications. Dressing and pressure applied.  An After Visit Summary was printed and given to the patient. When family arrives, patient will be transported downstairs via wheelchair and home via private auto.  [place], [employer] 06/12/2013 11:12 AM

## 2017-10-18 ENCOUNTER — Encounter: Payer: Self-pay | Admitting: Specialist

## 2017-10-29 ENCOUNTER — Ambulatory Visit: Payer: BLUE CROSS/BLUE SHIELD

## 2017-10-29 ENCOUNTER — Ambulatory Visit: Payer: BLUE CROSS/BLUE SHIELD | Admitting: Podiatry

## 2017-10-29 ENCOUNTER — Encounter: Payer: Self-pay | Admitting: Podiatry

## 2017-10-29 DIAGNOSIS — M79673 Pain in unspecified foot: Secondary | ICD-10-CM

## 2017-10-29 DIAGNOSIS — R234 Changes in skin texture: Secondary | ICD-10-CM | POA: Diagnosis not present

## 2017-10-29 DIAGNOSIS — E0843 Diabetes mellitus due to underlying condition with diabetic autonomic (poly)neuropathy: Secondary | ICD-10-CM

## 2017-10-31 NOTE — Progress Notes (Signed)
   HPI: 67 year old male with past medical history of type 2 diabetes presenting today as a new patient with a chief complaint of numbness and tingling to the bilateral great toes that has been ongoing for several months.  He states his feet are always cold.  He reports he was diagnosed with neuropathy in the past.  He is currently taking gabapentin 300 mg 3 times daily as treatment.  There are no modifying factors noted.  Patient is here for further evaluation and treatment.   Past Medical History:  Diagnosis Date  . Acute cerebral infarction ([place]) 11/2005   right caudate internal capsule secondary to small vessel  . CAD (coronary artery disease)    s/p MI in distant past (1995?); TTE 2010 - EF 55-60%  . Hypertension    fluctuates  . Lacunar infarction 2006/2007   Right basal ganglion, chronic lacunar infarct  . SBO (small bowel obstruction) ([place]) 09/2010   Resolved with conservative measures  . Stenosis of right carotid artery   . TIA (transient ischemic attack) 06/2009     Physical Exam: General: The patient is alert and oriented x3 in no acute distress.  Dermatology: Dry skin fissures bilateral great toes.  Skin is warm, dry and supple bilateral lower extremities.  Vascular: Palpable pedal pulses bilaterally. No edema or erythema noted. Capillary refill within normal limits.  Neurological: Epicritic and protective threshold diminished bilaterally.   Musculoskeletal Exam: Range of motion within normal limits to all pedal and ankle joints bilateral. Muscle strength 5/5 in all groups bilateral.    Assessment: -DM with neuropathy -Dry skin fissures bilateral great toes   Plan of Care:  -Patient evaluated. -Continue taking gabapentin 300 mg 3 times daily as per PCP. -Recommended DM foot cream from pharmacy. -Return to clinic as needed.   Edrick Kins, DPM [facility]  Dr. Edrick Kins, DPM    [address], [place] [address]                Office [phone]  Fax [phone]

## 2017-11-15 ENCOUNTER — Encounter: Payer: Self-pay | Admitting: Internal Medicine

## 2017-11-27 ENCOUNTER — Ambulatory Visit (AMBULATORY_SURGERY_CENTER): Payer: Self-pay

## 2017-11-27 VITALS — Ht 64.0 in | Wt 157.4 lb

## 2017-11-27 DIAGNOSIS — Z1211 Encounter for screening for malignant neoplasm of colon: Secondary | ICD-10-CM

## 2017-11-27 MED ORDER — NA SULFATE-K SULFATE-MG SULF 17.5-3.13-1.6 GM/177ML PO SOLN: 1.0000 | Freq: Once | ORAL | 0 refills | Status: AC

## 2017-11-27 NOTE — Progress Notes (Signed)
Per pt, no allergies to soy or egg products.Pt not taking any weight loss meds or using  O2 at home.  Pt refused emmi video. 

## 2017-12-07 ENCOUNTER — Encounter: Payer: Self-pay | Admitting: Internal Medicine

## 2017-12-14 ENCOUNTER — Ambulatory Visit (AMBULATORY_SURGERY_CENTER): Payer: BLUE CROSS/BLUE SHIELD | Admitting: Internal Medicine

## 2017-12-14 ENCOUNTER — Other Ambulatory Visit: Payer: Self-pay

## 2017-12-14 ENCOUNTER — Encounter: Payer: Self-pay | Admitting: Internal Medicine

## 2017-12-14 VITALS — BP 135/91 | HR 88 | Temp 98.6°F | Resp 19 | Ht 64.0 in | Wt 157.0 lb

## 2017-12-14 DIAGNOSIS — D122 Benign neoplasm of ascending colon: Secondary | ICD-10-CM | POA: Diagnosis not present

## 2017-12-14 DIAGNOSIS — Z1211 Encounter for screening for malignant neoplasm of colon: Secondary | ICD-10-CM

## 2017-12-14 DIAGNOSIS — Z1212 Encounter for screening for malignant neoplasm of rectum: Secondary | ICD-10-CM

## 2017-12-14 MED ORDER — SODIUM CHLORIDE 0.9 % IV SOLN: 500.0000 mL | Freq: Once | INTRAVENOUS | Status: DC

## 2017-12-14 NOTE — Op Note (Signed)
[facility] Patient Name: Jiraiya Mcewan Procedure Date: 12/14/2017 9:10 AM MRN: [id number] Endoscopist: Docia Chuck. Henrene Pastor , MD Age: 67 Referring MD:  Date of Birth: 11/05/1950 Gender: Male Account #: [id number] Procedure:                Colonoscopy, With cold snare polypectomy x 1 Indications:              Screening for colorectal malignant neoplasm Medicines:                Monitored Anesthesia Care Procedure:                Pre-Anesthesia Assessment:                           - Prior to the procedure, a History and Physical                            was performed, and patient medications and                            allergies were reviewed. The patient's tolerance of                            previous anesthesia was also reviewed. The risks                            and benefits of the procedure and the sedation                            options and risks were discussed with the patient.                            All questions were answered, and informed consent                            was obtained. Prior Anticoagulants: The patient has                            taken no previous anticoagulant or antiplatelet                            agents. ASA Grade Assessment: II - A patient with                            mild systemic disease. After reviewing the risks                            and benefits, the patient was deemed in                            satisfactory condition to undergo the procedure.                           After obtaining informed consent, the colonoscope  was passed under direct vision. Throughout the                            procedure, the patient's blood pressure, pulse, and                            oxygen saturations were monitored continuously. The                            Colonoscope was introduced through the anus and                            advanced to the. The Colonoscope was introduced            through the anus and advanced to the the cecum,                            identified by appendiceal orifice and ileocecal                            valve. The ileocecal valve, appendiceal orifice,                            and rectum were photographed. The quality of the                            bowel preparation was excellent. The colonoscopy                            was performed without difficulty. The patient                            tolerated the procedure well. The bowel preparation                            used was SUPREP. Scope In: 9:18:36 AM Scope Out: 9:31:55 AM Scope Withdrawal Time: 0 hours 10 minutes 44 seconds  Total Procedure Duration: 0 hours 13 minutes 19 seconds  Findings:                 A 2 mm polyp was found in the proximal ascending                            colon. The polyp was removed with a cold snare.                            Resection and retrieval were complete.                           The exam was otherwise without abnormality on                            direct and retroflexion views. Complications:            No immediate complications. Estimated blood loss:  None. Estimated Blood Loss:     Estimated blood loss: none. Impression:               - One 2 mm polyp in the proximal ascending colon,                            removed with a cold snare. Resected and retrieved.                           - The examination was otherwise normal on direct                            and retroflexion views. Recommendation:           - Repeat colonoscopy in 5-10 years for surveillance.                           - Patient has a contact number available for                            emergencies. The signs and symptoms of potential                            delayed complications were discussed with the                            patient. Return to normal activities tomorrow.                            Written discharge  instructions were provided to the                            patient.                           - Resume previous diet.                           - Continue present medications.                           - Await pathology results. Docia Chuck. Henrene Pastor, MD 12/14/2017 9:35:29 AM This report has been signed electronically.

## 2017-12-14 NOTE — Progress Notes (Signed)
To recovery, report to RN, VSS. 

## 2017-12-14 NOTE — Progress Notes (Signed)
Called to room to assist during endoscopic procedure.  Patient ID and intended procedure confirmed with present staff. Received instructions for my participation in the procedure from the performing physician.  

## 2017-12-14 NOTE — Progress Notes (Signed)
Pt's states no medical or surgical changes since previsit or office visit. 

## 2017-12-14 NOTE — Progress Notes (Signed)
Pt reports he ate breakfast yesterday morning, and had a piece of pizza last night for dinner. Pt reports he finished all of his prep and is going clear as of this morning. MD notified. Pt understands if he is not cleaned out we will have to stop procedure and reschedule.

## 2017-12-14 NOTE — Patient Instructions (Signed)
Polyp informaiton given.  YOU HAD AN ENDOSCOPIC PROCEDURE TODAY AT [facility]:   Refer to the procedure report that was given to you for any specific questions about what was found during the examination.  If the procedure report does not answer your questions, please call your gastroenterologist to clarify.  If you requested that your care partner not be given the details of your procedure findings, then the procedure report has been included in a sealed envelope for you to review at your convenience later.  YOU SHOULD EXPECT: Some feelings of bloating in the abdomen. Passage of more gas than usual.  Walking can help get rid of the air that was put into your GI tract during the procedure and reduce the bloating. If you had a lower endoscopy (such as a colonoscopy or flexible sigmoidoscopy) you may notice spotting of blood in your stool or on the toilet paper. If you underwent a bowel prep for your procedure, you may not have a normal bowel movement for a few days.  Please Note:  You might notice some irritation and congestion in your nose or some drainage.  This is from the oxygen used during your procedure.  There is no need for concern and it should clear up in a day or so.  SYMPTOMS TO REPORT IMMEDIATELY:   Following lower endoscopy (colonoscopy or flexible sigmoidoscopy):  Excessive amounts of blood in the stool  Significant tenderness or worsening of abdominal pains  Swelling of the abdomen that is new, acute  Fever of 100F or higher   For urgent or emergent issues, a gastroenterologist can be reached at any hour by calling [phone].   DIET:  We do recommend a small meal at first, but then you may proceed to your regular diet.  Drink plenty of fluids but you should avoid alcoholic beverages for 24 hours.  ACTIVITY:  You should plan to take it easy for the rest of today and you should NOT DRIVE or use heavy machinery until tomorrow (because of the sedation  medicines used during the test).    FOLLOW UP: Our staff will call the number listed on your records the next business day following your procedure to check on you and address any questions or concerns that you may have regarding the information given to you following your procedure. If we do not reach you, we will leave a message.  However, if you are feeling well and you are not experiencing any problems, there is no need to return our call.  We will assume that you have returned to your regular daily activities without incident.  If any biopsies were taken you will be contacted by phone or by letter within the next 1-3 weeks.  Please call us at [phone] if you have not heard about the biopsies in 3 weeks.    SIGNATURES/CONFIDENTIALITY: You and/or your care partner have signed paperwork which will be entered into your electronic medical record.  These signatures attest to the fact that that the information above on your After Visit Summary has been reviewed and is understood.  Full responsibility of the confidentiality of this discharge information lies with you and/or your care-partner.

## 2017-12-17 ENCOUNTER — Telehealth: Payer: Self-pay | Admitting: *Deleted

## 2017-12-17 NOTE — Telephone Encounter (Signed)
  Follow up Call-  Call back number 12/14/2017  Post procedure Call Back phone  # [phone]  Permission to leave phone message Yes  Some recent data might be hidden     Patient questions:  Do you have a fever, pain , or abdominal swelling? No. Pain Score  0 *  Have you tolerated food without any problems? Yes.    Have you been able to return to your normal activities? Yes.    Do you have any questions about your discharge instructions: Diet   No. Medications  No. Follow up visit  No.  Do you have questions or concerns about your Care? No.  Actions: * If pain score is 4 or above: No action needed, pain <4.  Spoke with patient's daughter, she states he is "fine", explained for him to call us back with any questions or concerns.

## 2017-12-18 ENCOUNTER — Encounter: Payer: Self-pay | Admitting: Internal Medicine

## 2018-09-12 ENCOUNTER — Other Ambulatory Visit: Payer: Self-pay

## 2018-09-12 ENCOUNTER — Encounter (HOSPITAL_COMMUNITY): Payer: Self-pay

## 2018-09-12 ENCOUNTER — Emergency Department (HOSPITAL_COMMUNITY)
Admission: EM | Admit: 2018-09-12 | Discharge: 2018-09-12 | Disposition: A | Discharge status: Home / Self Care | Payer: BLUE CROSS/BLUE SHIELD | Attending: Emergency Medicine | Admitting: Emergency Medicine

## 2018-09-12 DIAGNOSIS — I129 Hypertensive chronic kidney disease with stage 1 through stage 4 chronic kidney disease, or unspecified chronic kidney disease: Secondary | ICD-10-CM | POA: Diagnosis not present

## 2018-09-12 DIAGNOSIS — N189 Chronic kidney disease, unspecified: Secondary | ICD-10-CM | POA: Insufficient documentation

## 2018-09-12 DIAGNOSIS — I2510 Atherosclerotic heart disease of native coronary artery without angina pectoris: Secondary | ICD-10-CM | POA: Diagnosis not present

## 2018-09-12 DIAGNOSIS — E1165 Type 2 diabetes mellitus with hyperglycemia: Secondary | ICD-10-CM | POA: Insufficient documentation

## 2018-09-12 DIAGNOSIS — Z79899 Other long term (current) drug therapy: Secondary | ICD-10-CM | POA: Insufficient documentation

## 2018-09-12 DIAGNOSIS — E1122 Type 2 diabetes mellitus with diabetic chronic kidney disease: Secondary | ICD-10-CM | POA: Insufficient documentation

## 2018-09-12 DIAGNOSIS — Z794 Long term (current) use of insulin: Secondary | ICD-10-CM | POA: Diagnosis not present

## 2018-09-12 DIAGNOSIS — Z7982 Long term (current) use of aspirin: Secondary | ICD-10-CM | POA: Diagnosis not present

## 2018-09-12 DIAGNOSIS — R55 Syncope and collapse: Secondary | ICD-10-CM | POA: Diagnosis not present

## 2018-09-12 DIAGNOSIS — R739 Hyperglycemia, unspecified: Secondary | ICD-10-CM

## 2018-09-12 DIAGNOSIS — R42 Dizziness and giddiness: Secondary | ICD-10-CM

## 2018-09-12 LAB — I-STAT TROPONIN, ED: Troponin i, poc: 0 ng/mL (ref 0.00–0.08)

## 2018-09-12 LAB — CBC WITH DIFFERENTIAL/PLATELET
Abs Immature Granulocytes: 0.05 10*3/uL (ref 0.00–0.07)
BASOS ABS: 0 10*3/uL (ref 0.0–0.1)
Basophils Relative: 0 %
EOS ABS: 0.1 10*3/uL (ref 0.0–0.5)
EOS PCT: 1 %
HEMATOCRIT: 42.7 % (ref 39.0–52.0)
HEMOGLOBIN: 14.8 g/dL (ref 13.0–17.0)
Immature Granulocytes: 1 %
LYMPHS PCT: 29 %
Lymphs Abs: 2.2 10*3/uL (ref 0.7–4.0)
MCH: 31.1 pg (ref 26.0–34.0)
MCHC: 34.7 g/dL (ref 30.0–36.0)
MCV: 89.7 fL (ref 80.0–100.0)
Monocytes Absolute: 0.8 10*3/uL (ref 0.1–1.0)
Monocytes Relative: 11 %
NRBC: 0 % (ref 0.0–0.2)
Neutro Abs: 4.3 10*3/uL (ref 1.7–7.7)
Neutrophils Relative %: 58 %
Platelets: 344 10*3/uL (ref 150–400)
RBC: 4.76 MIL/uL (ref 4.22–5.81)
RDW: 11.6 % (ref 11.5–15.5)
WBC: 7.5 10*3/uL (ref 4.0–10.5)

## 2018-09-12 LAB — COMPREHENSIVE METABOLIC PANEL
ALK PHOS: 120 U/L (ref 38–126)
ALT: 47 U/L — AB (ref 0–44)
AST: 37 U/L (ref 15–41)
Albumin: 3.9 g/dL (ref 3.5–5.0)
Anion gap: 10 (ref 5–15)
BUN: 13 mg/dL (ref 8–23)
CALCIUM: 9.2 mg/dL (ref 8.9–10.3)
CHLORIDE: 96 mmol/L — AB (ref 98–111)
CO2: 26 mmol/L (ref 22–32)
CREATININE: 1.05 mg/dL (ref 0.61–1.24)
GFR calc Af Amer: >60 mL/min (ref >60)
GFR calc non Af Amer: >60 mL/min (ref >60)
Glucose, Bld: 425 mg/dL — ABNORMAL HIGH (ref 70–99)
Potassium: 3.7 mmol/L (ref 3.5–5.1)
SODIUM: 132 mmol/L — AB (ref 135–145)
Total Bilirubin: 0.6 mg/dL (ref 0.3–1.2)
Total Protein: 8.2 g/dL — ABNORMAL HIGH (ref 6.5–8.1)

## 2018-09-12 LAB — CBG MONITORING, ED
GLUCOSE-CAPILLARY: 455 mg/dL — AB (ref 70–99)
GLUCOSE-CAPILLARY: 258 mg/dL — AB (ref 70–99)

## 2018-09-12 MED ORDER — SODIUM CHLORIDE 0.9 % IV BOLUS
1000.0000 mL | Freq: Once | INTRAVENOUS | Status: AC
  Administered 2018-09-12: 1000 mL via INTRAVENOUS

## 2018-09-12 MED ORDER — INSULIN ASPART 100 UNIT/ML ~~LOC~~ SOLN
8.0000 [IU] | Freq: Once | SUBCUTANEOUS | Status: AC
  Administered 2018-09-12: 8 [IU] via INTRAVENOUS

## 2018-09-12 NOTE — Discharge Instructions (Addendum)
Continue your medications as previously prescribed.  Keep a record of your blood sugars for the next several days and follow-up the results with your primary doctor to discuss whether or not you need to make changes in your medications or diet.  Return to the emergency department if symptoms significantly worsen or change.

## 2018-09-12 NOTE — ED Provider Notes (Signed)
[facility] EMERGENCY DEPARTMENT Provider Note   CSN: [id number] Arrival date & time: 09/12/18  1630     History   Chief Complaint Chief Complaint  Patient presents with  . Near Syncope    HPI Jock Mahon Mahkai Fangman is a 67 y.o. male.  Patient is a 67 year old male with past medical history of coronary artery disease, chronic renal insufficiency, diabetes, and hypertension.  He presents today for evaluation of dizziness.  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  He states that each time he did this and bent over he became dizzy and lightheaded.  This happened multiple times throughout the day.  When he returned home, he checked his blood sugar and it read over 800.  He called EMS who rechecked it and it was over 500.  He does report excessive thirst and urination over the past several weeks.  He denies any fevers, chills, or chest pain.  The history is provided by the patient.  Near Syncope  This is a new problem. Episode onset: This morning. Episode frequency: Intermittently. The problem has been gradually worsening. Pertinent negatives include no chest pain, no headaches and no shortness of breath. Exacerbated by: Changing position. Nothing relieves the symptoms. He has tried nothing for the symptoms.    Past Medical History:  Diagnosis Date  . Acute cerebral infarction ([place]) 11/2005   right caudate internal capsule secondary to small vessel  . CAD (coronary artery disease)    s/p MI in distant past (1995?); TTE 2010 - EF 55-60%  . Chronic kidney disease    per pt, had kidney disease 48-73 years old  . Diabetes mellitus without complication ([place])   . Hyperlipidemia   . Hypertension    fluctuates  . Lacunar infarction ([place]) 2006/2007   Right basal ganglion, chronic lacunar infarct  . Myocardial infarction [facility])    ?1995  . SBO (small bowel obstruction) ([place]) 09/2010   Resolved with conservative measures/ per pt has had 2 SBO!  . Stenosis of right  carotid artery   . Stroke ([place]) 2010  . TIA (transient ischemic attack) 06/2009    Patient Active Problem List   Diagnosis Date Noted  . DM type 2 (diabetes mellitus, type 2) ([place]) 06/11/2013  . Small bowel obstruction ([place]) 06/08/2013  . Vomiting 06/08/2013  . Vertigo, benign positional 01/06/2012    Past Surgical History:  Procedure Laterality Date  . broken collarbone     right collarbone  . broken shoulder     right shoulder  . FINGER SURGERY     right hand, tip of finger mashed and broken fingers  . LAPAROSCOPIC CHOLECYSTECTOMY  2009   by Dr. Dalbert Batman        Home Medications    Prior to Admission medications   Medication Sig Start Date End Date Taking? Authorizing Provider  aspirin EC 81 MG tablet Take 81 mg by mouth daily. For pain    [provider]  BD PEN NEEDLE MICRO U/F 32G X 6 MM MISC Use pen needles for insulin injection each evening 10/09/17   [provider]  gabapentin (NEURONTIN) 300 MG capsule Take 300 mg by mouth 3 (three) times daily. 10/06/17   [provider]  glimepiride (AMARYL) 1 MG tablet Take 1 tablet (1 mg total) by mouth daily with breakfast. Patient not taking: Reported on 11/27/2017 06/12/13   Delfina Redwood, MD  LEVEMIR FLEXTOUCH 100 UNIT/ML Pen Inject 20 units at bedtime and  30 units in am 10/06/17   [provider]  losartan-hydrochlorothiazide (HYZAAR) 100-12.5 MG tablet Take 1 tablet by mouth daily. 10/09/17   [provider]  meloxicam (MOBIC) 15 MG tablet Take 15 mg by mouth daily. 10/09/17   [provider]  metFORMIN (GLUCOPHAGE) 1000 MG tablet Take 1,000 mg by mouth 2 (two) times daily. 10/09/17   [provider]  sildenafil (REVATIO) 20 MG tablet TAKE 1 TABLET BY MOUTH prior TO sexual activity prn 08/31/17   [provider]  verapamil (VERELAN PM) 240 MG 24 hr capsule Take by mouth daily. 10/09/17   [provider]    Family History Family History    Problem Relation Age of Onset  . Lung cancer Mother   . Emphysema Mother   . Heart failure Father   . Emphysema Sister     Social History Social History   Tobacco Use  . Smoking status: Never Smoker  . Smokeless tobacco: Never Used  Substance Use Topics  . Alcohol use: No  . Drug use: No     Allergies   Bee venom and Penicillins   Review of Systems Review of Systems  Respiratory: Negative for shortness of breath.   Cardiovascular: Positive for near-syncope. Negative for chest pain.  Neurological: Negative for headaches.  All other systems reviewed and are negative.    Physical Exam Updated Vital Signs BP 109/70 (BP Location: Right Arm)   Pulse 74   Temp 97.9 F (36.6 C) (Oral)   Resp 16   Ht 5\' 4"  (1.626 m)   Wt 74.8 kg   SpO2 100%   BMI 28.32 kg/m   Physical Exam  Constitutional: He is oriented to person, place, and time. He appears well-developed and well-nourished. No distress.  HENT:  Head: Normocephalic and atraumatic.  Mouth/Throat: Oropharynx is clear and moist.  Eyes: Pupils are equal, round, and reactive to light. EOM are normal.  Neck: Normal range of motion. Neck supple.  Cardiovascular: Normal rate and regular rhythm. Exam reveals no friction rub.  No murmur heard. Pulmonary/Chest: Effort normal and breath sounds normal. No respiratory distress. He has no wheezes. He has no rales.  Abdominal: Soft. Bowel sounds are normal. He exhibits no distension. There is no tenderness.  Musculoskeletal: Normal range of motion. He exhibits no edema.  Neurological: He is alert and oriented to person, place, and time. No cranial nerve deficit. He exhibits normal muscle tone. Coordination normal.  Skin: Skin is warm and dry. He is not diaphoretic.  Nursing note and vitals reviewed.    ED Treatments / Results  Labs (all labs ordered are listed, but only abnormal results are displayed) Labs Reviewed  CBG MONITORING, ED - Abnormal; Notable for the  following components:      Result Value   Glucose-Capillary 455 (*)    All other components within normal limits  COMPREHENSIVE METABOLIC PANEL  CBC WITH DIFFERENTIAL/PLATELET  I-STAT TROPONIN, ED    EKG None  Radiology No results found.  Procedures Procedures (including critical care time)  Medications Ordered in ED Medications  sodium chloride 0.9 % bolus 1,000 mL (has no administration in time range)  insulin aspart (novoLOG) injection 8 Units (has no administration in time range)     Initial Impression / Assessment and Plan / ED Course  I have reviewed the triage vital signs and the nursing notes.  Pertinent labs & imaging results that were available during my care of the patient were reviewed by me and considered  in my medical decision making (see chart for details).  Patient presents with dizziness that began today while at work.  His sugars were markedly elevated earlier today, but are now improving after IV fluids and insulin.  There is no sign for diabetic ketoacidosis and the patient is now feeling better.  At this point, I feel as though he is appropriate for discharge.  To return as needed if he experiences additional problems.  I have asked him to keep a record of his blood sugars and take these with him to his next doctor's appointment so that they can make changes in his medications as needed.  Final Clinical Impressions(s) / ED Diagnoses   Final diagnoses:  None    ED Discharge Orders    None       Veryl Speak, MD 09/12/18 1909

## 2018-09-12 NOTE — ED Triage Notes (Signed)
Pt states woke up as normal today and went to work.  After eating became suddenly dizzy and weak.  Felt like he would pass out when he bent over to pick something up.  After 3 hours he felt he needed to be seen in ER.  A/O x 4 EMS vitals CBG 540 118/64 70 pulse 97%RA

## 2018-12-12 ENCOUNTER — Telehealth: Payer: Self-pay

## 2018-12-12 ENCOUNTER — Ambulatory Visit: Payer: Self-pay | Admitting: Cardiology

## 2018-12-12 DIAGNOSIS — I361 Nonrheumatic tricuspid (valve) insufficiency: Secondary | ICD-10-CM | POA: Insufficient documentation

## 2018-12-12 NOTE — Progress Notes (Deleted)
Patient is here for follow up visit.  Subjective:   @Patient  ID: [name], male    DOB: September 24, 1951, 68 y.o.   MRN: [id number]  No chief complaint on file.   HPI  Past Medical History:  Diagnosis Date  . Acute cerebral infarction ([place]) 11/2005   right caudate internal capsule secondary to small vessel  . CAD (coronary artery disease)    s/p MI in distant past (1995?); TTE 2010 - EF 55-60%  . Chronic kidney disease    per pt, had kidney disease 33-68 years old  . Diabetes mellitus without complication ([place])   . Hyperlipidemia   . Hypertension    fluctuates  . Lacunar infarction ([place]) 2006/2007   Right basal ganglion, chronic lacunar infarct  . Myocardial infarction [facility])    ?1995  . SBO (small bowel obstruction) ([place]) 09/2010   Resolved with conservative measures/ per pt has had 2 SBO!  . Stenosis of right carotid artery   . Stroke ([place]) 2010  . TIA (transient ischemic attack) 06/2009    Past Surgical History:  Procedure Laterality Date  . broken collarbone     right collarbone  . broken shoulder     right shoulder  . FINGER SURGERY     right hand, tip of finger mashed and broken fingers  . LAPAROSCOPIC CHOLECYSTECTOMY  2009   by Dr. Dalbert Batman    Social History   Socioeconomic History  . Marital status: Married    Spouse name: Not on file  . Number of children: 4  . Years of education: 64  . Highest education level: Not on file  Occupational History  . Occupation: [employer]  . Financial resource strain: Not on file  . Food insecurity:    Worry: Not on file    Inability: Not on file  . Transportation needs:    Medical: Not on file    Non-medical: Not on file  Tobacco Use  . Smoking status: Never Smoker  . Smokeless tobacco: Never Used  Substance and Sexual Activity  . Alcohol use: No  . Drug use: No  . Sexual activity: Yes    Birth control/protection: None  Lifestyle  . Physical activity:    Days per week: Not on file   Minutes per session: Not on file  . Stress: Not on file  Relationships  . Social connections:    Talks on phone: Not on file    Gets together: Not on file    Attends religious service: Not on file    Active member of club or organization: Not on file    Attends meetings of clubs or organizations: Not on file    Relationship status: Not on file  . Intimate partner violence:    Fear of current or ex partner: Not on file    Emotionally abused: Not on file    Physically abused: Not on file    Forced sexual activity: Not on file  Other Topics Concern  . Not on file  Social History Narrative   Lives with his wife.  Went to  [employer], was asked to leave 2 weeks prior to graduation    Current Outpatient Medications on File Prior to Visit  Medication Sig Dispense Refill  . aspirin EC 81 MG tablet Take 81 mg by mouth daily. For pain    . BD PEN NEEDLE MICRO U/F 32G X 6 MM MISC Use pen needles for insulin injection each evening  11  . gabapentin (NEURONTIN) 300 MG capsule Take 300 mg by mouth 3 (three) times daily.  3  . glimepiride (AMARYL) 1 MG tablet Take 1 tablet (1 mg total) by mouth daily with breakfast. (Patient not taking: Reported on 11/27/2017) 30 tablet 0  . LEVEMIR FLEXTOUCH 100 UNIT/ML Pen Inject 20 units at bedtime and 30 units in am  3  . losartan-hydrochlorothiazide (HYZAAR) 100-12.5 MG tablet Take 1 tablet by mouth daily.  3  . meloxicam (MOBIC) 15 MG tablet Take 15 mg by mouth daily.  3  . metFORMIN (GLUCOPHAGE) 1000 MG tablet Take 1,000 mg by mouth 2 (two) times daily.  3  . sildenafil (REVATIO) 20 MG tablet TAKE 1 TABLET BY MOUTH prior TO sexual activity prn  3  . verapamil (VERELAN PM) 240 MG 24 hr capsule Take by mouth daily.  5   Current Facility-Administered Medications on File Prior to Visit  Medication Dose Route Frequency Provider Last Rate Last Dose  . 0.9 %  sodium chloride infusion  500 mL Intravenous Once Irene Shipper, MD        Cardiovascular  studies:  *** Exercise sestamibi stress test 11/30/2017:  1. The patient performed treadmill exercise using a Bruce protocol, completing 6 minutes. The patient completed an estimated workload of 7.05 METS, reaching 85% maximum predicted heart rate. The patient did not develop symptoms other than fatigue. Normal hemodynamic response. Low exercise capacity for age. 2. No ischemic changes seen on stress EKG. 3. The overall quality of the study is excellent. There is no evidence of abnormal lung activity. Stress and rest SPECT images demonstrate homogeneous tracer distribution throughout the myocardium. Gated SPECT imaging reveals normal myocardial thickening and wall motion. The left ventricular ejection fraction was normal (54%).  4. This is a low risk study.  Echocardiogram 12/04/2017: Left ventricle cavity is normal in size. Mild concentric hypertrophy of the left ventricle. Normal global wall motion. Doppler evidence of grade I (impaired) diastolic dysfunction, normal LAP. Calculated EF 57%. Left atrial cavity is normal in size. Intact interatrial septum. Agitated saline contrast study showed no PFO. Mild to moderate mitral regurgitation. Mild to moderate tricuspid regurgitation. Estimated pulmonary artery systolic pressure 33 mmHg.  Hospital echocardiogram 2010: Left ventricle: The cavity size was normal. Wall thickness was  normal. Systolic function was normal. The estimated ejection  fraction was in the range of 55% to 60%.  Aortic valve: Mildly thickened leaflets. Doppler: No significant  regurgitation.  Mitral valve: Mildly thickened leaflets . Doppler: Trivial  regurgitation. Peak gradient: 54mm Hg (D).  Left atrium: The atrium was normal in size.  Right ventricle: The cavity size was normal. Systolic function was  normal.  Pulmonic valve: Structurally normal valve. Cusp separation was  normal. Doppler: Transvalvular velocity was within the normal range.  No significant  regurgitation.  Tricuspid valve: Structurally normal valve. Leaflet separation was  normal. Doppler: Transvalvular velocity was within the normal range.  Trivial regurgitation.  Pericardium: There was no pericardial effusion.  ROS     Objective:   There were no vitals filed for this visit.   Physical Exam      Assessment & Recommendations:   ***  There are no diagnoses linked to this encounter.   Nigel Mormon, MD [facility] Pager: [phone] Office: [phone] If no answer Cell [phone]

## 2018-12-12 NOTE — Telephone Encounter (Signed)
Patient missed Jan 14th 2020 Echo appt and needs to reschedule echo & follow-up appt he missed today (Feb 20th 2020

## 2018-12-26 NOTE — Telephone Encounter (Signed)
Left message for Mr. [name] to call to schedule appts

## 2019-03-26 ENCOUNTER — Encounter (HOSPITAL_COMMUNITY): Payer: Self-pay | Admitting: Emergency Medicine

## 2019-03-26 ENCOUNTER — Emergency Department (HOSPITAL_COMMUNITY): Payer: BC Managed Care – PPO

## 2019-03-26 ENCOUNTER — Emergency Department (HOSPITAL_COMMUNITY)
Admission: EM | Admit: 2019-03-26 | Discharge: 2019-03-26 | Disposition: A | Discharge status: Home / Self Care | Payer: BC Managed Care – PPO | Attending: Emergency Medicine | Admitting: Emergency Medicine

## 2019-03-26 ENCOUNTER — Other Ambulatory Visit: Payer: Self-pay

## 2019-03-26 DIAGNOSIS — E860 Dehydration: Secondary | ICD-10-CM | POA: Insufficient documentation

## 2019-03-26 DIAGNOSIS — Z7982 Long term (current) use of aspirin: Secondary | ICD-10-CM | POA: Diagnosis not present

## 2019-03-26 DIAGNOSIS — E119 Type 2 diabetes mellitus without complications: Secondary | ICD-10-CM | POA: Insufficient documentation

## 2019-03-26 DIAGNOSIS — R55 Syncope and collapse: Secondary | ICD-10-CM | POA: Diagnosis not present

## 2019-03-26 DIAGNOSIS — I252 Old myocardial infarction: Secondary | ICD-10-CM | POA: Diagnosis not present

## 2019-03-26 DIAGNOSIS — Z8673 Personal history of transient ischemic attack (TIA), and cerebral infarction without residual deficits: Secondary | ICD-10-CM | POA: Insufficient documentation

## 2019-03-26 DIAGNOSIS — Z79899 Other long term (current) drug therapy: Secondary | ICD-10-CM | POA: Insufficient documentation

## 2019-03-26 DIAGNOSIS — Z794 Long term (current) use of insulin: Secondary | ICD-10-CM | POA: Diagnosis not present

## 2019-03-26 DIAGNOSIS — I2510 Atherosclerotic heart disease of native coronary artery without angina pectoris: Secondary | ICD-10-CM | POA: Insufficient documentation

## 2019-03-26 DIAGNOSIS — I10 Essential (primary) hypertension: Secondary | ICD-10-CM | POA: Insufficient documentation

## 2019-03-26 LAB — URINALYSIS, ROUTINE W REFLEX MICROSCOPIC
Bacteria, UA: NONE SEEN
Bilirubin Urine: NEGATIVE
Glucose, UA: >=500 mg/dL — AB
Hgb urine dipstick: NEGATIVE
Ketones, ur: NEGATIVE mg/dL
Leukocytes,Ua: NEGATIVE
Nitrite: NEGATIVE
Protein, ur: NEGATIVE mg/dL
Specific Gravity, Urine: 1.01 (ref 1.005–1.030)
pH: 5 (ref 5.0–8.0)

## 2019-03-26 LAB — CBC WITH DIFFERENTIAL/PLATELET
Abs Immature Granulocytes: 0.06 10*3/uL (ref 0.00–0.07)
Basophils Absolute: 0 10*3/uL (ref 0.0–0.1)
Basophils Relative: 0 %
Eosinophils Absolute: 0 10*3/uL (ref 0.0–0.5)
Eosinophils Relative: 0 %
HCT: 38 % — ABNORMAL LOW (ref 39.0–52.0)
Hemoglobin: 13.1 g/dL (ref 13.0–17.0)
Immature Granulocytes: 1 %
Lymphocytes Relative: 15 %
Lymphs Abs: 1.1 10*3/uL (ref 0.7–4.0)
MCH: 31.1 pg (ref 26.0–34.0)
MCHC: 34.5 g/dL (ref 30.0–36.0)
MCV: 90.3 fL (ref 80.0–100.0)
Monocytes Absolute: 0.7 10*3/uL (ref 0.1–1.0)
Monocytes Relative: 9 %
Neutro Abs: 5.6 10*3/uL (ref 1.7–7.7)
Neutrophils Relative %: 75 %
Platelets: 264 10*3/uL (ref 150–400)
RBC: 4.21 MIL/uL — ABNORMAL LOW (ref 4.22–5.81)
RDW: 12 % (ref 11.5–15.5)
WBC: 7.5 10*3/uL (ref 4.0–10.5)
nRBC: 0 % (ref 0.0–0.2)

## 2019-03-26 LAB — COMPREHENSIVE METABOLIC PANEL
ALT: 39 U/L (ref 0–44)
AST: 34 U/L (ref 15–41)
Albumin: 3.5 g/dL (ref 3.5–5.0)
Alkaline Phosphatase: 92 U/L (ref 38–126)
Anion gap: 13 (ref 5–15)
BUN: 15 mg/dL (ref 8–23)
CO2: 23 mmol/L (ref 22–32)
Calcium: 8.8 mg/dL — ABNORMAL LOW (ref 8.9–10.3)
Chloride: 96 mmol/L — ABNORMAL LOW (ref 98–111)
Creatinine, Ser: 0.88 mg/dL (ref 0.61–1.24)
GFR calc Af Amer: >60 mL/min (ref >60)
GFR calc non Af Amer: >60 mL/min (ref >60)
Glucose, Bld: 330 mg/dL — ABNORMAL HIGH (ref 70–99)
Potassium: 3.9 mmol/L (ref 3.5–5.1)
Sodium: 132 mmol/L — ABNORMAL LOW (ref 135–145)
Total Bilirubin: 1 mg/dL (ref 0.3–1.2)
Total Protein: 7 g/dL (ref 6.5–8.1)

## 2019-03-26 LAB — LIPASE, BLOOD: Lipase: 32 U/L (ref 11–51)

## 2019-03-26 LAB — CBG MONITORING, ED: Glucose-Capillary: 329 mg/dL — ABNORMAL HIGH (ref 70–99)

## 2019-03-26 LAB — TROPONIN I: Troponin I: <0.03 ng/mL (ref <0.03)

## 2019-03-26 MED ORDER — SODIUM CHLORIDE 0.9 % IV BOLUS
1000.0000 mL | Freq: Once | INTRAVENOUS | Status: AC
  Administered 2019-03-26: 08:00:00 1000 mL via INTRAVENOUS

## 2019-03-26 NOTE — ED Notes (Signed)
ED Provider at bedside. 

## 2019-03-26 NOTE — Discharge Instructions (Signed)
Call your family doc today and let them know that you passed out.  Eat and drink well for the next couple days.  Please return to the emergency department for chest pain shortness of breath headache neck pain abdominal pain vomiting or fever I do not have these and they could be a concerning symptom when you passed out.

## 2019-03-26 NOTE — ED Triage Notes (Signed)
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 Patient got in his truck and had a syncopal episode while sitting. Patient c/o dizziness that started around 5am. States the last time he felt like this his blood sugar was elevated. CBG of 383 per EMS. No pain.

## 2019-03-26 NOTE — ED Notes (Signed)
X-ray at bedside. Urinal placed bedside patient and informed of needing urine sample.

## 2019-03-26 NOTE — ED Notes (Signed)
Pt ambulated unassisted through hallway. Pt reported a little dizziness on standing but did not sway or feel as though he would fall. Pt said dizziness went away after a moment standing, not moving.

## 2019-03-26 NOTE — ED Provider Notes (Signed)
[facility] Provider Note   CSN: [id number] Arrival date & time: 03/26/19  1638    History   Chief Complaint Chief Complaint  Patient presents with  . Loss of Consciousness    HPI [name] is a 68 y.o. male.     68 yo M with a chief complaint of a syncopal event.  The patient has been feeling bad since yesterday.  Just feels a bit lightheaded.  Worse with standing for ambulation.  He feels that he has been eating and drinking normally.  Woke up this morning and took his medicine and then about 30 minutes later felt sick to his stomach and vomited.  He is able to drop his wife off at work and then made it to a convenience store to buy a Dr. Malachi Bonds threw up once in the convenient store and when he got back to his car he had a syncopal event.  He denies chest pain or shortness of breath.  Denies headache or neck pain.  Denies abdominal pain diarrhea.  No vomiting episodes except for these 2 today.  Still feels bad now.  The history is provided by the patient.  Loss of Consciousness  Episode history:  Single Most recent episode:  Today Duration:  2 minutes Timing:  Constant Progression:  Unchanged Chronicity:  New Context comment:  Vomiting Witnessed: no   Relieved by:  Bed rest Worsened by:  Nothing Ineffective treatments:  None tried Associated symptoms: nausea and vomiting   Associated symptoms: no chest pain, no confusion, no fever, no headaches, no palpitations and no shortness of breath     Past Medical History:  Diagnosis Date  . Acute cerebral infarction ([place]) 11/2005   right caudate internal capsule secondary to small vessel  . CAD (coronary artery disease)    s/p MI in distant past (1995?); TTE 2010 - EF 55-60%  . Chronic kidney disease    per pt, had kidney disease 19-73 years old  . Diabetes mellitus without complication ([place])   . Hyperlipidemia   . Hypertension    fluctuates  . Lacunar infarction ([place]) 2006/2007   Right basal ganglion, chronic lacunar infarct  . Myocardial infarction [facility])    ?1995  . SBO (small bowel obstruction) ([place]) 09/2010   Resolved with conservative measures/ per pt has had 2 SBO!  . Stenosis of right carotid artery   . Stroke ([place]) 2010  . TIA (transient ischemic attack) 06/2009    Patient Active Problem List   Diagnosis Date Noted  . Nonrheumatic tricuspid valve regurgitation 12/12/2018  . DM type 2 (diabetes mellitus, type 2) ([place]) 06/11/2013  . Small bowel obstruction ([place]) 06/08/2013  . Vomiting 06/08/2013  . Vertigo, benign positional 01/06/2012    Past Surgical History:  Procedure Laterality Date  . broken collarbone     right collarbone  . broken shoulder     right shoulder  . FINGER SURGERY     right hand, tip of finger mashed and broken fingers  . LAPAROSCOPIC CHOLECYSTECTOMY  2009   by Dr. Dalbert Batman        Home Medications    Prior to Admission medications   Medication Sig Start Date End Date Taking? Authorizing Provider  aspirin EC 81 MG tablet Take 81 mg by mouth daily. For pain    [provider]  BD PEN NEEDLE MICRO U/F 32G X 6 MM MISC Use pen needles for insulin injection each evening 10/09/17   [provider]  gabapentin (NEURONTIN) 300 MG capsule Take 300 mg by mouth 3 (three) times daily. 10/06/17   [provider]  glimepiride (AMARYL) 1 MG tablet Take 1 tablet (1 mg total) by mouth daily with breakfast. Patient not taking: Reported on 11/27/2017 06/12/13   Delfina Redwood, MD  LEVEMIR FLEXTOUCH 100 UNIT/ML Pen Inject 20 units at bedtime and 30 units in am 10/06/17   [provider]  losartan-hydrochlorothiazide (HYZAAR) 100-12.5 MG tablet Take 1 tablet by mouth daily. 10/09/17   [provider]  meloxicam (MOBIC) 15 MG tablet Take 15 mg by mouth daily. 10/09/17   [provider]  metFORMIN (GLUCOPHAGE) 1000 MG tablet Take 1,000 mg by mouth 2 (two) times daily. 10/09/17   [provider]  sildenafil (REVATIO) 20 MG tablet TAKE 1 TABLET BY MOUTH prior TO sexual activity prn 08/31/17   [provider]  verapamil (VERELAN PM) 240 MG 24 hr capsule Take by mouth daily. 10/09/17   [provider]    Family History Family History  Problem Relation Age of Onset  . Lung cancer Mother   . Emphysema Mother   . Heart failure Father   . Emphysema Sister     Social History Social History   Tobacco Use  . Smoking status: Never Smoker  . Smokeless tobacco: Never Used  Substance Use Topics  . Alcohol use: No  . Drug use: No     Allergies   Bee venom and Penicillins   Review of Systems Review of Systems  Constitutional: Negative for chills and fever.  HENT: Negative for congestion and facial swelling.   Eyes: Negative for discharge and visual disturbance.  Respiratory: Negative for shortness of breath.   Cardiovascular: Positive for syncope. Negative for chest pain and palpitations.  Gastrointestinal: Positive for nausea and vomiting. Negative for abdominal pain and diarrhea.  Musculoskeletal: Negative for arthralgias and myalgias.  Skin: Negative for color change and rash.  Neurological: Positive for syncope. Negative for tremors and headaches.  Psychiatric/Behavioral: Negative for confusion and dysphoric mood.     Physical Exam Updated Vital Signs BP 116/73   Pulse 67   Temp 97.6 F (36.4 C) (Oral)   Resp 17   Ht 5\' 4"  (1.626 m)   Wt 74.8 kg   SpO2 97%   BMI 28.32 kg/m   Physical Exam Vitals signs and nursing note reviewed.  Constitutional:      Appearance: He is well-developed.  HENT:     Head: Normocephalic and atraumatic.  Eyes:     Pupils: Pupils are equal, round, and reactive to light.  Neck:     Musculoskeletal: Normal range of motion and neck supple.     Vascular: No JVD.  Cardiovascular:     Rate and Rhythm: Normal rate and regular rhythm.     Heart sounds: No murmur. No friction rub. No gallop.    Pulmonary:     Effort: No respiratory distress.     Breath sounds: No wheezing.  Abdominal:     General: There is no distension.     Tenderness: There is no guarding or rebound.  Musculoskeletal: Normal range of motion.  Skin:    Coloration: Skin is not pale.     Findings: No rash.  Neurological:     Mental Status: He is alert and oriented to person, place, and time.  Psychiatric:        Behavior: Behavior normal.      ED Treatments / Results  Labs (all labs ordered are listed, but only abnormal results are displayed) Labs Reviewed  CBC WITH DIFFERENTIAL/PLATELET - Abnormal; Notable for the following components:      Result Value   RBC 4.21 (*)    HCT 38.0 (*)    All other components within normal limits  COMPREHENSIVE METABOLIC PANEL - Abnormal; Notable for the following components:   Sodium 132 (*)    Chloride 96 (*)    Glucose, Bld 330 (*)    Calcium 8.8 (*)    All other components within normal limits  URINALYSIS, ROUTINE W REFLEX MICROSCOPIC - Abnormal; Notable for the following components:   Glucose, UA >=500 (*)    All other components within normal limits  CBG MONITORING, ED - Abnormal; Notable for the following components:   Glucose-Capillary 329 (*)    All other components within normal limits  LIPASE, BLOOD  TROPONIN I    EKG EKG Interpretation  Date/Time:  Wednesday March 26 2019 07:38:24 EDT Ventricular Rate:  61 PR Interval:    QRS Duration: 82 QT Interval:  446 QTC Calculation: 450 R Axis:   44 Text Interpretation:  Sinus rhythm Abnormal R-wave progression, early transition Borderline ST elevation, anterior leads Baseline wander in lead(s) V3 no wpw, prolonged qt or brugada No significant change since last tracing Confirmed by Deno Etienne [phone]) on 03/26/2019 7:48:39 AM   Radiology Dg Chest Port 1 View  Result Date: 03/26/2019 CLINICAL DATA:  Weakness, syncope, history of coronary artery disease post MI, hypertension, diabetes EXAM: PORTABLE  CHEST 1 VIEW COMPARISON:  Portable exam 0756 hours compared to 01/12/2010 FINDINGS: Normal heart size, mediastinal contours, and pulmonary vascularity. Lungs clear. No pulmonary infiltrate, pleural effusion or pneumothorax. Bones demineralized with an old healed fracture of the mid RIGHT clavicle. IMPRESSION: No acute abnormalities. Electronically Signed   By: Lavonia Dana M.D.   On: 03/26/2019 08:30    Procedures Procedures (including critical care time)  Medications Ordered in ED Medications  sodium chloride 0.9 % bolus 1,000 mL (0 mLs Intravenous Stopped 03/26/19 1115)     Initial Impression / Assessment and Plan / ED Course  I have reviewed the triage vital signs and the nursing notes.  Pertinent labs & imaging results that were available during my care of the patient were reviewed by me and considered in my medical decision making (see chart for details).        68 yo M with a cc of a syncopal event.  Post vomiting, likely vagal.  As he doesn't feel well will check labs, cxr, ua.  Give a fluid bolus and reassess.   The patient is feeling better on reassessment he is able to ambulate in the ED without assistance.  He has no leukocytosis he is not anemic his LFTs and lipase are normal.  UA is negative for infection.  He does have hypochloremia and hyponatremia which may be consistent with dehydration.  As he felt better after IV fluids this is likely consistent.  We will have him eat and drink well for the next couple days.  PCP follow-up.  11:15 AM:  I have discussed the diagnosis/risks/treatment options with the patient and believe the pt to be eligible for discharge home to follow-up with PCP. We also discussed returning to the ED immediately if new or worsening sx occur. We discussed the sx which are most concerning (e.g., sudden worsening pain, fever, inability to tolerate by mouth ) that necessitate immediate return. Medications administered to the patient during  their visit and any  new prescriptions provided to the patient are listed below.  Medications given during this visit Medications  sodium chloride 0.9 % bolus 1,000 mL (0 mLs Intravenous Stopped 03/26/19 1115)     The patient appears reasonably screen and/or stabilized for discharge and I doubt any other medical condition or other [facility] requiring further screening, evaluation, or treatment in the ED at this time prior to discharge.   Final Clinical Impressions(s) / ED Diagnoses   Final diagnoses:  Syncope and collapse  Dehydration    ED Discharge Orders    None       Deno Etienne, DO 03/26/19 1115

## 2019-03-26 NOTE — ED Notes (Signed)
Patient verbalizes understanding of discharge instructions. Opportunity for questioning and answers were provided. Armband removed by staff, pt discharged from ED. Pt ambulatory at discharge, wheeled to lobby for comfort.

## 2019-11-12 DIAGNOSIS — E1142 Type 2 diabetes mellitus with diabetic polyneuropathy: Secondary | ICD-10-CM | POA: Diagnosis present

## 2019-12-20 ENCOUNTER — Other Ambulatory Visit: Payer: Self-pay

## 2019-12-20 ENCOUNTER — Emergency Department: Payer: Medicare Other

## 2019-12-20 ENCOUNTER — Inpatient Hospital Stay
Admission: EM | Admit: 2019-12-20 | Discharge: 2019-12-23 | DRG: 872 | Disposition: A | Discharge status: Home / Self Care | Payer: Medicare Other | Attending: Internal Medicine | Admitting: Internal Medicine

## 2019-12-20 ENCOUNTER — Encounter: Payer: Self-pay | Admitting: Emergency Medicine

## 2019-12-20 DIAGNOSIS — D62 Acute posthemorrhagic anemia: Secondary | ICD-10-CM | POA: Diagnosis present

## 2019-12-20 DIAGNOSIS — Z20822 Contact with and (suspected) exposure to covid-19: Secondary | ICD-10-CM | POA: Diagnosis present

## 2019-12-20 DIAGNOSIS — Z791 Long term (current) use of non-steroidal anti-inflammatories (NSAID): Secondary | ICD-10-CM | POA: Diagnosis not present

## 2019-12-20 DIAGNOSIS — Z794 Long term (current) use of insulin: Secondary | ICD-10-CM

## 2019-12-20 DIAGNOSIS — K259 Gastric ulcer, unspecified as acute or chronic, without hemorrhage or perforation: Secondary | ICD-10-CM | POA: Diagnosis present

## 2019-12-20 DIAGNOSIS — Z8673 Personal history of transient ischemic attack (TIA), and cerebral infarction without residual deficits: Secondary | ICD-10-CM | POA: Diagnosis not present

## 2019-12-20 DIAGNOSIS — A419 Sepsis, unspecified organism: Secondary | ICD-10-CM | POA: Diagnosis present

## 2019-12-20 DIAGNOSIS — E1165 Type 2 diabetes mellitus with hyperglycemia: Secondary | ICD-10-CM

## 2019-12-20 DIAGNOSIS — Z79899 Other long term (current) drug therapy: Secondary | ICD-10-CM | POA: Diagnosis not present

## 2019-12-20 DIAGNOSIS — K2100 Gastro-esophageal reflux disease with esophagitis, without bleeding: Secondary | ICD-10-CM | POA: Diagnosis present

## 2019-12-20 DIAGNOSIS — E785 Hyperlipidemia, unspecified: Secondary | ICD-10-CM | POA: Diagnosis present

## 2019-12-20 DIAGNOSIS — E871 Hypo-osmolality and hyponatremia: Secondary | ICD-10-CM

## 2019-12-20 DIAGNOSIS — Z7982 Long term (current) use of aspirin: Secondary | ICD-10-CM | POA: Diagnosis not present

## 2019-12-20 DIAGNOSIS — K2970 Gastritis, unspecified, without bleeding: Secondary | ICD-10-CM

## 2019-12-20 DIAGNOSIS — E119 Type 2 diabetes mellitus without complications: Secondary | ICD-10-CM

## 2019-12-20 DIAGNOSIS — Z8249 Family history of ischemic heart disease and other diseases of the circulatory system: Secondary | ICD-10-CM | POA: Diagnosis not present

## 2019-12-20 DIAGNOSIS — K2900 Acute gastritis without bleeding: Secondary | ICD-10-CM

## 2019-12-20 DIAGNOSIS — E872 Acidosis: Secondary | ICD-10-CM | POA: Diagnosis present

## 2019-12-20 DIAGNOSIS — N179 Acute kidney failure, unspecified: Secondary | ICD-10-CM | POA: Diagnosis present

## 2019-12-20 DIAGNOSIS — E1151 Type 2 diabetes mellitus with diabetic peripheral angiopathy without gangrene: Secondary | ICD-10-CM | POA: Diagnosis present

## 2019-12-20 DIAGNOSIS — Z801 Family history of malignant neoplasm of trachea, bronchus and lung: Secondary | ICD-10-CM

## 2019-12-20 DIAGNOSIS — I10 Essential (primary) hypertension: Secondary | ICD-10-CM | POA: Diagnosis present

## 2019-12-20 DIAGNOSIS — I2510 Atherosclerotic heart disease of native coronary artery without angina pectoris: Secondary | ICD-10-CM | POA: Diagnosis present

## 2019-12-20 DIAGNOSIS — E861 Hypovolemia: Secondary | ICD-10-CM | POA: Diagnosis present

## 2019-12-20 DIAGNOSIS — E860 Dehydration: Secondary | ICD-10-CM | POA: Diagnosis present

## 2019-12-20 DIAGNOSIS — Z825 Family history of asthma and other chronic lower respiratory diseases: Secondary | ICD-10-CM

## 2019-12-20 DIAGNOSIS — Z880 Allergy status to penicillin: Secondary | ICD-10-CM | POA: Diagnosis not present

## 2019-12-20 DIAGNOSIS — I252 Old myocardial infarction: Secondary | ICD-10-CM

## 2019-12-20 DIAGNOSIS — Z91030 Bee allergy status: Secondary | ICD-10-CM

## 2019-12-20 DIAGNOSIS — K529 Noninfective gastroenteritis and colitis, unspecified: Secondary | ICD-10-CM | POA: Diagnosis present

## 2019-12-20 LAB — CBC
HCT: 40.7 % (ref 39.0–52.0)
Hemoglobin: 14.3 g/dL (ref 13.0–17.0)
MCH: 31.4 pg (ref 26.0–34.0)
MCHC: 35.1 g/dL (ref 30.0–36.0)
MCV: 89.3 fL (ref 80.0–100.0)
Platelets: 354 10*3/uL (ref 150–400)
RBC: 4.56 MIL/uL (ref 4.22–5.81)
RDW: 12.4 % (ref 11.5–15.5)
WBC: 24.1 10*3/uL — ABNORMAL HIGH (ref 4.0–10.5)
nRBC: 0 % (ref 0.0–0.2)

## 2019-12-20 LAB — COMPREHENSIVE METABOLIC PANEL
ALT: 35 U/L (ref 0–44)
AST: 34 U/L (ref 15–41)
Albumin: 4.3 g/dL (ref 3.5–5.0)
Alkaline Phosphatase: 93 U/L (ref 38–126)
Anion gap: 11 (ref 5–15)
BUN: 32 mg/dL — ABNORMAL HIGH (ref 8–23)
CO2: 23 mmol/L (ref 22–32)
Calcium: 8.7 mg/dL — ABNORMAL LOW (ref 8.9–10.3)
Chloride: 96 mmol/L — ABNORMAL LOW (ref 98–111)
Creatinine, Ser: 1.43 mg/dL — ABNORMAL HIGH (ref 0.61–1.24)
GFR calc Af Amer: 59 mL/min — ABNORMAL LOW (ref >60)
GFR calc non Af Amer: 51 mL/min — ABNORMAL LOW (ref >60)
Glucose, Bld: 279 mg/dL — ABNORMAL HIGH (ref 70–99)
Potassium: 4.8 mmol/L (ref 3.5–5.1)
Sodium: 130 mmol/L — ABNORMAL LOW (ref 135–145)
Total Bilirubin: 1.4 mg/dL — ABNORMAL HIGH (ref 0.3–1.2)
Total Protein: 8.2 g/dL — ABNORMAL HIGH (ref 6.5–8.1)

## 2019-12-20 LAB — DIFFERENTIAL
Abs Immature Granulocytes: 0.23 10*3/uL — ABNORMAL HIGH (ref 0.00–0.07)
Basophils Absolute: 0.1 10*3/uL (ref 0.0–0.1)
Basophils Relative: 0 %
Eosinophils Absolute: 0 10*3/uL (ref 0.0–0.5)
Eosinophils Relative: 0 %
Immature Granulocytes: 1 %
Lymphocytes Relative: 5 %
Lymphs Abs: 1.1 10*3/uL (ref 0.7–4.0)
Monocytes Absolute: 2.1 10*3/uL — ABNORMAL HIGH (ref 0.1–1.0)
Monocytes Relative: 9 %
Neutro Abs: 20.6 10*3/uL — ABNORMAL HIGH (ref 1.7–7.7)
Neutrophils Relative %: 85 %

## 2019-12-20 LAB — URINALYSIS, COMPLETE (UACMP) WITH MICROSCOPIC
Bacteria, UA: NONE SEEN
Bilirubin Urine: NEGATIVE
Glucose, UA: 150 mg/dL — AB
Hgb urine dipstick: NEGATIVE
Ketones, ur: NEGATIVE mg/dL
Leukocytes,Ua: NEGATIVE
Nitrite: NEGATIVE
Protein, ur: NEGATIVE mg/dL
Specific Gravity, Urine: 1.03 (ref 1.005–1.030)
pH: 6 (ref 5.0–8.0)

## 2019-12-20 LAB — PROTIME-INR
INR: 1.2 (ref 0.8–1.2)
Prothrombin Time: 14.9 seconds (ref 11.4–15.2)

## 2019-12-20 LAB — GLUCOSE, CAPILLARY: Glucose-Capillary: 258 mg/dL — ABNORMAL HIGH (ref 70–99)

## 2019-12-20 LAB — TROPONIN I (HIGH SENSITIVITY)
Troponin I (High Sensitivity): 5 ng/L (ref <18)
Troponin I (High Sensitivity): 5 ng/L (ref <18)

## 2019-12-20 LAB — HEMOGLOBIN: Hemoglobin: 12.2 g/dL — ABNORMAL LOW (ref 13.0–17.0)

## 2019-12-20 LAB — LACTIC ACID, PLASMA: Lactic Acid, Venous: 2.1 mmol/L (ref 0.5–1.9)

## 2019-12-20 LAB — LIPASE, BLOOD: Lipase: 20 U/L (ref 11–51)

## 2019-12-20 LAB — APTT: aPTT: 27 seconds (ref 24–36)

## 2019-12-20 MED ORDER — SODIUM CHLORIDE 0.9 % IV BOLUS
1000.0000 mL | Freq: Once | INTRAVENOUS | Status: AC
  Administered 2019-12-20: 17:00:00 1000 mL via INTRAVENOUS

## 2019-12-20 MED ORDER — ACETAMINOPHEN 650 MG RE SUPP: 650.0000 mg | Freq: Four times a day (QID) | RECTAL | Status: DC | PRN

## 2019-12-20 MED ORDER — METRONIDAZOLE IN NACL 5-0.79 MG/ML-% IV SOLN
500.0000 mg | Freq: Once | INTRAVENOUS | Status: AC
  Administered 2019-12-20: 19:00:00 500 mg via INTRAVENOUS
  Filled 2019-12-20: qty 100

## 2019-12-20 MED ORDER — ACETAMINOPHEN 325 MG PO TABS
650.0000 mg | ORAL_TABLET | Freq: Four times a day (QID) | ORAL | Status: DC | PRN
  Administered 2019-12-21 – 2019-12-22 (×2): 650 mg via ORAL
  Filled 2019-12-20 (×2): qty 2

## 2019-12-20 MED ORDER — SODIUM CHLORIDE 0.9 % IV SOLN: INTRAVENOUS | Status: DC

## 2019-12-20 MED ORDER — INSULIN ASPART 100 UNIT/ML ~~LOC~~ SOLN
0.0000 [IU] | SUBCUTANEOUS | Status: DC
  Administered 2019-12-20 – 2019-12-21 (×2): 8 [IU] via SUBCUTANEOUS
  Administered 2019-12-21 (×2): 3 [IU] via SUBCUTANEOUS
  Administered 2019-12-21: 03:00:00 2 [IU] via SUBCUTANEOUS
  Filled 2019-12-20 (×5): qty 1

## 2019-12-20 MED ORDER — SODIUM CHLORIDE 0.9 % IV BOLUS (SEPSIS)
250.0000 mL | Freq: Once | INTRAVENOUS | Status: AC
  Administered 2019-12-20: 22:00:00 250 mL via INTRAVENOUS

## 2019-12-20 MED ORDER — MORPHINE SULFATE (PF) 2 MG/ML IV SOLN: 2.0000 mg | INTRAVENOUS | Status: DC | PRN

## 2019-12-20 MED ORDER — PANTOPRAZOLE SODIUM 40 MG IV SOLR
40.0000 mg | INTRAVENOUS | Status: DC
  Administered 2019-12-20: 22:00:00 40 mg via INTRAVENOUS
  Filled 2019-12-20: qty 40

## 2019-12-20 MED ORDER — ONDANSETRON HCL 4 MG/2ML IJ SOLN
4.0000 mg | Freq: Four times a day (QID) | INTRAMUSCULAR | Status: DC | PRN
  Administered 2019-12-22: 4 mg via INTRAVENOUS
  Filled 2019-12-20: qty 2

## 2019-12-20 MED ORDER — CIPROFLOXACIN IN D5W 400 MG/200ML IV SOLN
400.0000 mg | Freq: Once | INTRAVENOUS | Status: AC
  Administered 2019-12-20: 18:00:00 400 mg via INTRAVENOUS
  Filled 2019-12-20: qty 200

## 2019-12-20 MED ORDER — ONDANSETRON HCL 4 MG PO TABS: 4.0000 mg | ORAL_TABLET | Freq: Four times a day (QID) | ORAL | Status: DC | PRN

## 2019-12-20 MED ORDER — LEVOFLOXACIN IN D5W 750 MG/150ML IV SOLN
750.0000 mg | INTRAVENOUS | Status: DC
  Administered 2019-12-21: 07:00:00 750 mg via INTRAVENOUS
  Filled 2019-12-20: qty 150

## 2019-12-20 MED ORDER — IOHEXOL 300 MG/ML  SOLN
100.0000 mL | Freq: Once | INTRAMUSCULAR | Status: AC | PRN
  Administered 2019-12-20: 17:00:00 100 mL via INTRAVENOUS

## 2019-12-20 MED ORDER — METRONIDAZOLE IN NACL 5-0.79 MG/ML-% IV SOLN: 500.0000 mg | Freq: Three times a day (TID) | INTRAVENOUS | Status: DC

## 2019-12-20 MED ORDER — METRONIDAZOLE IN NACL 5-0.79 MG/ML-% IV SOLN
500.0000 mg | Freq: Three times a day (TID) | INTRAVENOUS | Status: DC
  Administered 2019-12-21 – 2019-12-23 (×8): 500 mg via INTRAVENOUS
  Filled 2019-12-20 (×10): qty 100

## 2019-12-20 NOTE — H&P (Signed)
History and Physical    [name] [id number] DOB: 1951-03-26 DOA: 12/20/2019  PCP: Patient, No Pcp Per   Patient coming from:home  I have personally briefly reviewed patient's old medical records in [place]  Chief Complaint: malaise, weakness vomiting  HPI: [name] is a 69 y.o. male with medical history significant for insulin-dependent type 2 diabetes, hypertension, history of CVA as well as history of small bowel obstruction treated conservatively who presents to the emergency room with a complaint of generalized weakness and vomiting.  Patient was in his usual state of health and awoke this morning feeling weak, shaky and as the days progressed developed nausea and then had three episodes of vomiting. He said the vomitus was dark, not coffee ground but he attributes it to having recently drunk a Dr. Malachi Bonds.  No associated abdominal pain.  He denied fever or chills or change in bowel habits.  He denied cough shortness of breath or chest pain  ED Course: On arrival in the emergency room he had a low-grade temperature of 99.1, low blood pressure of 76/49, heart rate 88, O2 sat 94% on room air.  His blood work was significant for white cell count of 24,000 and a lactic acid of 2.1.  Myoglobin was normal at 14.3.  Other abnormalities included sodium of 130, glucose 229 and creatinine 1.43 above his baseline of 0.88.  INR slightly elevated at 1.2.  EKG showed normal sinus rhythm.  UA was negative.  CT abdomen and pelvis showed antral wall thickening, gastritis or ulcer with recommendation for consideration of EGD.  It also showed a nodular liver.  CT head showed no acute findings.  Covid test pending at time of admission.  [place] was started on sepsis fluid bolus with improvement in blood pressure from 76/49 - 112/71 by admission.  Review of Systems: As per HPI otherwise 10 point review of systems negative.    Past Medical History:  Diagnosis Date  . Acute cerebral  infarction ([place]) 11/2005   right caudate internal capsule secondary to small vessel  . CAD (coronary artery disease)    s/p MI in distant past (1995?); TTE 2010 - EF 55-60%  . Chronic kidney disease    per pt, had kidney disease 56-65 years old  . Diabetes mellitus without complication ([name])   . Hyperlipidemia   . Hypertension    fluctuates  . Lacunar infarction ([place]) 2006/2007   Right basal ganglion, chronic lacunar infarct  . Myocardial infarction [facility])    ?1995  . SBO (small bowel obstruction) ([place]) 09/2010   Resolved with conservative measures/ per pt has had 2 SBO!  . Stenosis of right carotid artery   . Stroke ([place]) 2010  . TIA (transient ischemic attack) 06/2009    Past Surgical History:  Procedure Laterality Date  . broken collarbone     right collarbone  . broken shoulder     right shoulder  . FINGER SURGERY     right hand, tip of finger mashed and broken fingers  . LAPAROSCOPIC CHOLECYSTECTOMY  2009   by Dr. Dalbert Batman     reports that he has never smoked. He has never used smokeless tobacco. He reports that he does not drink alcohol or use drugs.  Allergies  Allergen Reactions  . Bee Venom Anaphylaxis  . Penicillins Anaphylaxis    Family History  Problem Relation Age of Onset  . Lung cancer Mother   . Emphysema Mother   . Heart failure  Father   . Emphysema Sister      Prior to Admission medications   Medication Sig Start Date End Date Taking? Authorizing Provider  aspirin EC 81 MG tablet Take 81 mg by mouth daily. For pain    [provider]  BD PEN NEEDLE MICRO U/F 32G X 6 MM MISC Use pen needles for insulin injection each evening 10/09/17   [provider]  gabapentin (NEURONTIN) 300 MG capsule Take 300 mg by mouth 3 (three) times daily. 10/06/17   [provider]  glimepiride (AMARYL) 1 MG tablet Take 1 tablet (1 mg total) by mouth daily with breakfast. Patient not taking: Reported on 11/27/2017 06/12/13   Delfina Redwood, MD   LEVEMIR FLEXTOUCH 100 UNIT/ML Pen Inject 20 units at bedtime and 30 units in am 10/06/17   [provider]  losartan-hydrochlorothiazide (HYZAAR) 100-12.5 MG tablet Take 1 tablet by mouth daily. 10/09/17   [provider]  meloxicam (MOBIC) 15 MG tablet Take 15 mg by mouth daily. 10/09/17   [provider]  metFORMIN (GLUCOPHAGE) 1000 MG tablet Take 1,000 mg by mouth 2 (two) times daily. 10/09/17   [provider]  sildenafil (REVATIO) 20 MG tablet TAKE 1 TABLET BY MOUTH prior TO sexual activity prn 08/31/17   [provider]  verapamil (VERELAN PM) 240 MG 24 hr capsule Take by mouth daily. 10/09/17   [provider]    Physical Exam: Vitals:   12/20/19 1830 12/20/19 1845 12/20/19 1900 12/20/19 1915  BP: 130/73 111/71 112/71 104/71  Pulse: 81 85 81 77  Resp: 19 (!) 28 (!) 22 (!) 24  Temp:      SpO2: 98% 98% 97% 96%     Vitals:   12/20/19 1830 12/20/19 1845 12/20/19 1900 12/20/19 1915  BP: 130/73 111/71 112/71 104/71  Pulse: 81 85 81 77  Resp: 19 (!) 28 (!) 22 (!) 24  Temp:      SpO2: 98% 98% 97% 96%    Constitutional: NAD, alert and oriented x 3 Eyes: PERRL, lids and conjunctivae normal ENMT: Mucous membranes are moist.  Neck: normal, supple, no masses, no thyromegaly Respiratory: clear to auscultation bilaterally, no wheezing, no crackles. Normal respiratory effort. No accessory muscle use.  Cardiovascular: Regular rate and rhythm, no murmurs / rubs / gallops. No extremity edema. 2+ pedal pulses. No carotid bruits.  Abdomen: no tenderness, no masses palpated. No hepatosplenomegaly. Bowel sounds positive.  Musculoskeletal: no clubbing / cyanosis. No joint deformity upper and lower extremities.  Skin: no rashes, lesions, ulcers.  Neurologic: No gross focal neurologic deficit. Psychiatric: Normal mood and affect.   Labs on Admission: I have personally reviewed following labs and imaging studies  CBC: Recent Labs  Lab  12/20/19 1603  WBC 24.1*  NEUTROABS 20.6*  HGB 14.3  HCT 40.7  MCV 89.3  PLT [id number]   Basic Metabolic Panel: Recent Labs  Lab 12/20/19 1603  NA 130*  K 4.8  CL 96*  CO2 23  GLUCOSE 279*  BUN 32*  CREATININE 1.43*  CALCIUM 8.7*   GFR: CrCl cannot be calculated (Unknown ideal weight.). Liver Function Tests: Recent Labs  Lab 12/20/19 1603  AST 34  ALT 35  ALKPHOS 93  BILITOT 1.4*  PROT 8.2*  ALBUMIN 4.3   No results for input(s): LIPASE, AMYLASE in the last 168 hours. No results for input(s): AMMONIA in the last 168 hours. Coagulation Profile: Recent Labs  Lab 12/20/19 1603  INR 1.2   Cardiac Enzymes:  No results for input(s): CKTOTAL, CKMB, CKMBINDEX, TROPONINI in the last 168 hours. BNP (last 3 results) No results for input(s): PROBNP in the last 8760 hours. HbA1C: No results for input(s): HGBA1C in the last 72 hours. CBG: No results for input(s): GLUCAP in the last 168 hours. Lipid Profile: No results for input(s): CHOL, HDL, LDLCALC, TRIG, CHOLHDL, LDLDIRECT in the last 72 hours. Thyroid Function Tests: No results for input(s): TSH, T4TOTAL, FREET4, T3FREE, THYROIDAB in the last 72 hours. Anemia Panel: No results for input(s): VITAMINB12, FOLATE, FERRITIN, TIBC, IRON, RETICCTPCT in the last 72 hours. Urine analysis:    Component Value Date/Time   COLORURINE YELLOW (A) 12/20/2019 1726   APPEARANCEUR CLEAR (A) 12/20/2019 1726   LABSPEC 1.030 12/20/2019 1726   PHURINE 6.0 12/20/2019 1726   GLUCOSEU 150 (A) 12/20/2019 1726   HGBUR NEGATIVE 12/20/2019 1726   BILIRUBINUR NEGATIVE 12/20/2019 1726   KETONESUR NEGATIVE 12/20/2019 1726   PROTEINUR NEGATIVE 12/20/2019 1726   UROBILINOGEN 1.0 06/07/2013 2149   NITRITE NEGATIVE 12/20/2019 1726   LEUKOCYTESUR NEGATIVE 12/20/2019 1726    Radiological Exams on Admission: CT HEAD WO CONTRAST  Result Date: 12/20/2019 CLINICAL DATA:  Focal neuro deficit. Stroke suspected EXAM: CT HEAD WITHOUT CONTRAST TECHNIQUE:  Contiguous axial images were obtained from the base of the skull through the vertex without intravenous contrast. COMPARISON:  06/10/2013 FINDINGS: Brain: No evidence of acute infarction, hemorrhage, hydrocephalus, extra-axial collection or mass lesion/mass effect. Mild atrophy and some chronic microvascular ischemic changes. Vascular: No hyperdense vessel or unexpected calcification. Skull: Normal. Negative for fracture or focal lesion. Sinuses/Orbits: Visualized paranasal sinuses and orbits are unremarkable. Other: None. IMPRESSION: No acute intracranial findings. Signs of prior infarcts in the right basal ganglia and evidence of atrophy. Electronically Signed   By: Zetta Bills M.D.   On: 12/20/2019 17:11   CT ABDOMEN PELVIS W CONTRAST  Result Date: 12/20/2019 CLINICAL DATA:  Abdominal distension and hypotension. EXAM: CT ABDOMEN AND PELVIS WITH CONTRAST TECHNIQUE: Multidetector CT imaging of the abdomen and pelvis was performed using the standard protocol following bolus administration of intravenous contrast. CONTRAST:  136mL OMNIPAQUE IOHEXOL 300 MG/ML  SOLN COMPARISON:  06/07/2013 FINDINGS: Lower chest: Signs of basilar atelectasis. No signs of consolidation or pleural effusion. Hepatobiliary: No signs of focal, suspicious hepatic lesion. Lobular hepatic contours and nodular liver surface. Post cholecystectomy without ductal dilation. Portal vein is patent. Pancreas: Pancreas is normal without focal inflammation or ductal dilation. Spleen: Spleen is normal size without focal lesion. Adrenals/Urinary Tract: Adrenal glands are normal. No signs of hydronephrosis or suspicious renal lesion. Urinary bladder is normal. Stomach/Bowel: Signs of gastric antral thickening and irregularity. Best seen on image 28 of series 2. No signs of bowel obstruction. Appendix is normal, in the right lower quadrant. Colon is stool filled throughout much of its course. Vascular/Lymphatic: Vascular structures in the abdomen are  patent. No signs of adenopathy but with numerous small lymph nodes in the retroperitoneum and upper abdomen. No signs of pelvic adenopathy. Reproductive: Prostate mildly enlarged. Heterogeneous, nonspecific finding on CT. Other: Small fat containing inguinal hernias. No free air. No ascites. Musculoskeletal: No acute bone process. IMPRESSION: 1. Signs of gastric antral wall thickening and irregularity. Findings are concerning for gastritis or ulcer disease, consider endoscopic correlation on follow-up. 2. Lobular hepatic contours and nodular liver surface suggests underlying liver disease. Correlate with clinical or laboratory evidence of liver dysfunction. 3. Post cholecystectomy without ductal dilation. 4. Small fat containing inguinal hernias. 5. Mildly enlarged heterogeneous prostate gland.  Correlate with physical examination and PSA. 6. Constipation. Electronically Signed   By: Zetta Bills M.D.   On: 12/20/2019 17:24   DG Chest Port 1 View  Result Date: 12/20/2019 CLINICAL DATA:  69 year old male with altered mental status. EXAM: PORTABLE CHEST 1 VIEW COMPARISON:  Chest radiograph dated 03/26/2019. FINDINGS: Mild chronic interstitial coarsening and bronchitic changes. No focal consolidation, pleural effusion, or pneumothorax. Top-normal cardiac silhouette. No acute osseous pathology. Biapical subpleural plaques noted. IMPRESSION: No acute cardiopulmonary process. Electronically Signed   By: Anner Crete M.D.   On: 12/20/2019 16:47   DG Abd Portable 1 View  Result Date: 12/20/2019 CLINICAL DATA:  69 year old male with abdominal pain. EXAM: PORTABLE ABDOMEN - 1 VIEW COMPARISON:  CT abdomen pelvis dated 06/07/2013. FINDINGS: There is moderate stool throughout the colon. No bowel dilatation or evidence of obstruction. No free air or radiopaque calculi. Right upper quadrant cholecystectomy clips. There is mild osteopenia and degenerative changes of the spine. No acute osseous pathology. IMPRESSION: No  acute findings. Electronically Signed   By: Anner Crete M.D.   On: 12/20/2019 16:49    EKG: Independently reviewed.   Assessment/Plan Principal Problem:   Sepsis ([place]) suspect GI source   Gastritis/possible gastric ulcer on CT Past history of SBO treated conservatively -CT abdomen showing gastritis possibly ulcer.  No SBO on CT -Patient completed sepsis fluid bolus and will be continued on maintenance fluids -IV metronidazole, IV Levaquin as patient had severe allergic reaction to penicillins -IV Protonix, IV antiemetics -Serial H&H possibility of upper GI bleed, especially in view of CT abdomen finding -Keep n.p.o. for now -GI consult for consideration of endoscopy -Follow blood cultures    Hyponatremia -Suspect hypovolemic related to GI losses -Continue IV hydration and serial monitoring of serum sodium  AKI (acute kidney injury) ([place]) -Creatinine was 1.43 above baseline of 0.88 -Likely prerenal from vomiting as well as from sepsis -IV hydration -Monitor renal function and avoid nephrotoxins    Hyperglycemia due to type 2 diabetes mellitus (HCC) -Blood sugar was 279 -Insulin sliding scale coverage every 4 hours patient is being kept n.p.o. -Follow A1c    Essential hypertension -Hold all home antihypertensives as patient was pretty hypotensive at 76/49 in the ER related to sepsis.  Can resume as appropriate    History of CVA (cerebrovascular accident) -Hold aspirin given concern for possible GI bleed -Not currently on statin      DVT prophylaxis: scds. Will hold heparin products whil trending hemoglobin  Code Status: full code  Family Communication: none  Disposition Plan: Back to previous home environment Consults called: GI. Dr Franklyn Lor MD [facility]     12/20/2019, 7:44 PM

## 2019-12-20 NOTE — ED Triage Notes (Signed)
Pt arrived via POV from home with reports of dizziness, weakness, left side weakness and headache. Pt states he has also had vomiting.  Pt reports vomiting today states it looked like Dr. Malachi Bonds.

## 2019-12-20 NOTE — ED Provider Notes (Signed)
[facility] Emergency Department Provider Note  ____________________________________________  Time seen: Approximately 6:46 PM  I have reviewed the triage vital signs and the nursing notes.   HISTORY  Chief Complaint Near Syncope and Weakness    HPI [name] is a 69 y.o. male who presents the emergency department complaining of weakness, dizziness, nausea, vomiting, headache.  Patient presented to the emergency department with 1 day history of symptoms.  Patient states that he went to bed last night, felt completely okay.  States that he woke up this morning feeling dizzy, weak.  He states that he felt as if his arms and legs were made out of rubber.  Patient currently denies any headache, visual changes, neck pain or stiffness, chest pain, shortness of breath, abdominal pain.  Patient has been nauseated and had 3 episodes of emesis today including 1 in the emergency department.  Patient denies any diarrhea or constipation.  No dysuria, polyuria, hematuria.  Patient has a history of CVA, coronary artery disease, CKD, diabetes, hypertension, hyperlipidemia, MI, TIAs, small bowel obstruction.         Past Medical History:  Diagnosis Date  . Acute cerebral infarction ([place]) 11/2005   right caudate internal capsule secondary to small vessel  . CAD (coronary artery disease)    s/p MI in distant past (1995?); TTE 2010 - EF 55-60%  . Chronic kidney disease    per pt, had kidney disease 58-23 years old  . Diabetes mellitus without complication ([place])   . Hyperlipidemia   . Hypertension    fluctuates  . Lacunar infarction ([place]) 2006/2007   Right basal ganglion, chronic lacunar infarct  . Myocardial infarction [facility])    ?1995  . SBO (small bowel obstruction) ([place]) 09/2010   Resolved with conservative measures/ per pt has had 2 SBO!  . Stenosis of right carotid artery   . Stroke ([place]) 2010  . TIA (transient ischemic attack) 06/2009    Patient Active Problem  List   Diagnosis Date Noted  . Sepsis ([place]) 12/20/2019  . Gastritis 12/20/2019  . Hyponatremia 12/20/2019  . AKI (acute kidney injury) ([place]) 12/20/2019  . Essential hypertension 12/20/2019  . Nonrheumatic tricuspid valve regurgitation 12/12/2018  . Insulin dependent type 2 diabetes mellitus ([place]) 06/11/2013  . Small bowel obstruction ([place]) 06/08/2013  . Vomiting 06/08/2013  . Vertigo, benign positional 01/06/2012    Past Surgical History:  Procedure Laterality Date  . broken collarbone     right collarbone  . broken shoulder     right shoulder  . FINGER SURGERY     right hand, tip of finger mashed and broken fingers  . LAPAROSCOPIC CHOLECYSTECTOMY  2009   by Dr. Dalbert Batman    Prior to Admission medications   Medication Sig Start Date End Date Taking? Authorizing Provider  aspirin EC 81 MG tablet Take 81 mg by mouth daily. For pain    [provider]  BD PEN NEEDLE MICRO U/F 32G X 6 MM MISC Use pen needles for insulin injection each evening 10/09/17   [provider]  gabapentin (NEURONTIN) 300 MG capsule Take 300 mg by mouth 3 (three) times daily. 10/06/17   [provider]  glimepiride (AMARYL) 1 MG tablet Take 1 tablet (1 mg total) by mouth daily with breakfast. Patient not taking: Reported on 11/27/2017 06/12/13   Delfina Redwood, MD  LEVEMIR FLEXTOUCH 100 UNIT/ML Pen Inject 20 units at bedtime and 30 units in am 10/06/17   [provider]  losartan-hydrochlorothiazide (HYZAAR) 100-12.5 MG tablet Take 1 tablet by mouth daily. 10/09/17   [provider]  meloxicam (MOBIC) 15 MG tablet Take 15 mg by mouth daily. 10/09/17   [provider]  metFORMIN (GLUCOPHAGE) 1000 MG tablet Take 1,000 mg by mouth 2 (two) times daily. 10/09/17   [provider]  sildenafil (REVATIO) 20 MG tablet TAKE 1 TABLET BY MOUTH prior TO sexual activity prn 08/31/17   [provider]  verapamil (VERELAN PM) 240 MG 24 hr capsule Take by  mouth daily. 10/09/17   [provider]    Allergies Bee venom and Penicillins  Family History  Problem Relation Age of Onset  . Lung cancer Mother   . Emphysema Mother   . Heart failure Father   . Emphysema Sister     Social History Social History   Tobacco Use  . Smoking status: Never Smoker  . Smokeless tobacco: Never Used  Substance Use Topics  . Alcohol use: No  . Drug use: No     Review of Systems  Constitutional: No fever/chills.  Generalized weakness, dizziness. Eyes: No visual changes. No discharge ENT: No upper respiratory complaints. Cardiovascular: no chest pain. Respiratory: no cough. No SOB. Gastrointestinal: No abdominal pain.  Positive for nausea and vomiting.  No diarrhea.  No constipation. Genitourinary: Negative for dysuria. No hematuria Musculoskeletal: Negative for musculoskeletal pain. Skin: Negative for rash, abrasions, lacerations, ecchymosis. Neurological: Negative for headaches, focal weakness or numbness. 10-point ROS otherwise negative.  ____________________________________________   PHYSICAL EXAM:  VITAL SIGNS: ED Triage Vitals [12/20/19 1545]  Enc Vitals Group     BP (!) 76/49     Pulse Rate 88     Resp 18     Temp 99.1 F (37.3 C)     Temp src      SpO2 94 %     Weight      Height      Head Circumference      Peak Flow      Pain Score      Pain Loc      Pain Edu?      Excl. in [place]?      Constitutional: Alert and oriented.  Moderately ill appearing but in no acute distress.  Patient appears generally weak. Eyes: Conjunctivae are normal. PERRL. EOMI. Head: Atraumatic. ENT:      Ears: EACs and TMs unremarkable bilaterally.      Nose: No congestion/rhinnorhea.      Mouth/Throat: Mucous membranes are moist.  Neck: No stridor.  Neck is supple full range of motion Hematological/Lymphatic/Immunilogical: No cervical lymphadenopathy. Cardiovascular: Normal rate, regular rhythm. Normal S1 and S2.  Good peripheral  circulation. Respiratory: Normal respiratory effort without tachypnea or retractions. Lungs CTAB. Good air entry to the bases with no decreased or absent breath sounds. Gastrointestinal: Bowel sounds 4 quadrants. Soft and nontender to palpation. No guarding or rigidity. No palpable masses.  Abdomen appears slightly distended, though patient denies.. No CVA tenderness. Musculoskeletal: Full range of motion to all extremities. No gross deformities appreciated. Neurologic:  Normal speech and language. No gross focal neurologic deficits are appreciated.  Skin:  Skin is warm, dry and intact. No rash noted. Psychiatric: Mood and affect are normal. Speech and behavior are normal. Patient exhibits appropriate insight and judgement.   ____________________________________________   LABS (all labs ordered are listed, but only abnormal results are displayed)  Labs Reviewed  CBC - Abnormal; Notable for the following components:  Result Value   WBC 24.1 (*)    All other components within normal limits  URINALYSIS, COMPLETE (UACMP) WITH MICROSCOPIC - Abnormal; Notable for the following components:   Color, Urine YELLOW (*)    APPearance CLEAR (*)    Glucose, UA 150 (*)    All other components within normal limits  DIFFERENTIAL - Abnormal; Notable for the following components:   Neutro Abs 20.6 (*)    Monocytes Absolute 2.1 (*)    Abs Immature Granulocytes 0.23 (*)    All other components within normal limits  COMPREHENSIVE METABOLIC PANEL - Abnormal; Notable for the following components:   Sodium 130 (*)    Chloride 96 (*)    Glucose, Bld 279 (*)    BUN 32 (*)    Creatinine, Ser 1.43 (*)    Calcium 8.7 (*)    Total Protein 8.2 (*)    Total Bilirubin 1.4 (*)    GFR calc non Af Amer 51 (*)    GFR calc Af Amer 59 (*)    All other components within normal limits  LACTIC ACID, PLASMA - Abnormal; Notable for the following components:   Lactic Acid, Venous 2.1 (*)    All other components  within normal limits  CULTURE, BLOOD (ROUTINE X 2)  CULTURE, BLOOD (ROUTINE X 2)  SARS CORONAVIRUS 2 (TAT 6-24 HRS)  PROTIME-INR  APTT  CBG MONITORING, ED  CBG MONITORING, ED  TROPONIN I (HIGH SENSITIVITY)  TROPONIN I (HIGH SENSITIVITY)   ____________________________________________  EKG   ____________________________________________  RADIOLOGY I personally viewed and evaluated these images as part of my medical decision making, as well as reviewing the written report by the radiologist.  CT HEAD WO CONTRAST  Result Date: 12/20/2019 CLINICAL DATA:  Focal neuro deficit. Stroke suspected EXAM: CT HEAD WITHOUT CONTRAST TECHNIQUE: Contiguous axial images were obtained from the base of the skull through the vertex without intravenous contrast. COMPARISON:  06/10/2013 FINDINGS: Brain: No evidence of acute infarction, hemorrhage, hydrocephalus, extra-axial collection or mass lesion/mass effect. Mild atrophy and some chronic microvascular ischemic changes. Vascular: No hyperdense vessel or unexpected calcification. Skull: Normal. Negative for fracture or focal lesion. Sinuses/Orbits: Visualized paranasal sinuses and orbits are unremarkable. Other: None. IMPRESSION: No acute intracranial findings. Signs of prior infarcts in the right basal ganglia and evidence of atrophy. Electronically Signed   By: Zetta Bills M.D.   On: 12/20/2019 17:11   CT ABDOMEN PELVIS W CONTRAST  Result Date: 12/20/2019 CLINICAL DATA:  Abdominal distension and hypotension. EXAM: CT ABDOMEN AND PELVIS WITH CONTRAST TECHNIQUE: Multidetector CT imaging of the abdomen and pelvis was performed using the standard protocol following bolus administration of intravenous contrast. CONTRAST:  181mL OMNIPAQUE IOHEXOL 300 MG/ML  SOLN COMPARISON:  06/07/2013 FINDINGS: Lower chest: Signs of basilar atelectasis. No signs of consolidation or pleural effusion. Hepatobiliary: No signs of focal, suspicious hepatic lesion. Lobular hepatic  contours and nodular liver surface. Post cholecystectomy without ductal dilation. Portal vein is patent. Pancreas: Pancreas is normal without focal inflammation or ductal dilation. Spleen: Spleen is normal size without focal lesion. Adrenals/Urinary Tract: Adrenal glands are normal. No signs of hydronephrosis or suspicious renal lesion. Urinary bladder is normal. Stomach/Bowel: Signs of gastric antral thickening and irregularity. Best seen on image 28 of series 2. No signs of bowel obstruction. Appendix is normal, in the right lower quadrant. Colon is stool filled throughout much of its course. Vascular/Lymphatic: Vascular structures in the abdomen are patent. No signs of adenopathy but with numerous small lymph  nodes in the retroperitoneum and upper abdomen. No signs of pelvic adenopathy. Reproductive: Prostate mildly enlarged. Heterogeneous, nonspecific finding on CT. Other: Small fat containing inguinal hernias. No free air. No ascites. Musculoskeletal: No acute bone process. IMPRESSION: 1. Signs of gastric antral wall thickening and irregularity. Findings are concerning for gastritis or ulcer disease, consider endoscopic correlation on follow-up. 2. Lobular hepatic contours and nodular liver surface suggests underlying liver disease. Correlate with clinical or laboratory evidence of liver dysfunction. 3. Post cholecystectomy without ductal dilation. 4. Small fat containing inguinal hernias. 5. Mildly enlarged heterogeneous prostate gland. Correlate with physical examination and PSA. 6. Constipation. Electronically Signed   By: Zetta Bills M.D.   On: 12/20/2019 17:24   DG Chest Port 1 View  Result Date: 12/20/2019 CLINICAL DATA:  69 year old male with altered mental status. EXAM: PORTABLE CHEST 1 VIEW COMPARISON:  Chest radiograph dated 03/26/2019. FINDINGS: Mild chronic interstitial coarsening and bronchitic changes. No focal consolidation, pleural effusion, or pneumothorax. Top-normal cardiac silhouette.  No acute osseous pathology. Biapical subpleural plaques noted. IMPRESSION: No acute cardiopulmonary process. Electronically Signed   By: Anner Crete M.D.   On: 12/20/2019 16:47   DG Abd Portable 1 View  Result Date: 12/20/2019 CLINICAL DATA:  69 year old male with abdominal pain. EXAM: PORTABLE ABDOMEN - 1 VIEW COMPARISON:  CT abdomen pelvis dated 06/07/2013. FINDINGS: There is moderate stool throughout the colon. No bowel dilatation or evidence of obstruction. No free air or radiopaque calculi. Right upper quadrant cholecystectomy clips. There is mild osteopenia and degenerative changes of the spine. No acute osseous pathology. IMPRESSION: No acute findings. Electronically Signed   By: Anner Crete M.D.   On: 12/20/2019 16:49    ____________________________________________    PROCEDURES  Procedure(s) performed:    Procedures    Medications  metroNIDAZOLE (FLAGYL) IVPB 500 mg (500 mg Intravenous New Bag/Given 12/20/19 1852)  sodium chloride 0.9 % bolus 1,000 mL (1,000 mLs Intravenous New Bag/Given 12/20/19 1640)  ciprofloxacin (CIPRO) IVPB 400 mg (400 mg Intravenous New Bag/Given 12/20/19 1802)  iohexol (OMNIPAQUE) 300 MG/ML solution 100 mL (100 mLs Intravenous Contrast Given 12/20/19 1650)  sodium chloride 0.9 % bolus 1,000 mL (1,000 mLs Intravenous New Bag/Given 12/20/19 1710)     ____________________________________________   INITIAL IMPRESSION / ASSESSMENT AND PLAN / ED COURSE  Pertinent labs & imaging results that were available during my care of the patient were reviewed by me and considered in my medical decision making (see chart for details).  Review of the [place] CSRS was performed in accordance of the [place] prior to dispensing any controlled drugs.           Patient's diagnosis is consistent with enteritis and sepsis.  Patient presented to the emergency department complaining of weakness, dizziness, general malaise.  Patient was found to be in moderate distress,  diaphoretic initially.  Patient states that he had been nauseated, did have emesis today.  No other significant complaints.  Patient had temperature of 99.1, was hypotensive, tachycardic.  White blood cell count 24.1, elevated lactic.  Given patient's history of bowel obstructions, CT of the abdomen and pelvis was performed.  Findings consistent with colitis.  Given patient's history, patient had been empirically treated with Flagyl and Cipro as he is allergic to penicillins and was unable to receive Zosyn.  CT revealed inflammation around the stomach, bedside ultrasound had revealed what appeared to be inflamed small bowel loops from the stomach.  No evidence of obstruction.  Patient was given fluids, antibiotics and had  an elevated white blood cell count of 24.1 with an elevated lactic at 2.1.  Concern for sepsis at this time given patient's hypotension, fever, tachycardia, elevated white blood cell count and lactic acid.  Contacted hospitalist for admission and patient will be admitted to the hospital service.      ____________________________________________  FINAL CLINICAL IMPRESSION(S) / ED DIAGNOSES  Final diagnoses:  Enteritis  Sepsis without acute organ dysfunction, due to unspecified organism [facility])      NEW MEDICATIONS STARTED DURING THIS VISIT:  ED Discharge Orders    None          This chart was dictated using voice recognition software/Dragon. Despite best efforts to proofread, errors can occur which can change the meaning. Any change was purely unintentional.    Darletta Moll, PA-C 12/20/19 1925    Drenda Freeze, MD 12/20/19 2142

## 2019-12-20 NOTE — Consult Note (Signed)
Pharmacy Antibiotic Note  [name] is a 69 y.o. male admitted on 12/20/2019 with Intra-abdominal Infection.  Pharmacy has been consulted for Levofloxacin dosing. Ciprofloxacin @ ~1800 today.   PCN allergy  Plan: 1. Will order Levofloxacin 750 mg IV Q48H- first dose at 0600 on 2/28.   Height: 5\' 4"  (162.6 cm) Weight: 165 lb (74.8 kg) IBW/kg (Calculated) : 59.2  Temp (24hrs), Avg:99.1 F (37.3 C), Min:99.1 F (37.3 C), Max:99.1 F (37.3 C)  Recent Labs  Lab 12/20/19 1603 12/20/19 1624  WBC 24.1*  --   CREATININE 1.43*  --   LATICACIDVEN  --  2.1*    Estimated Creatinine Clearance: 47 mL/min (A) (by C-G formula based on SCr of 1.43 mg/dL (H)).    Allergies  Allergen Reactions  . Bee Venom Anaphylaxis  . Penicillins Anaphylaxis    Antimicrobials this admission:  Dose adjustments this admission:  Microbiology results:  Thank you for allowing pharmacy to be a part of this patient's care.  Rowland Lathe 12/20/2019 8:13 PM

## 2019-12-20 NOTE — ED Notes (Signed)
Pt given ice chips with the okay of the admitting dr.

## 2019-12-21 ENCOUNTER — Encounter
Admission: EM | Disposition: A | Discharge status: Home / Self Care | Payer: Self-pay | Source: Home / Self Care | Attending: Internal Medicine

## 2019-12-21 ENCOUNTER — Inpatient Hospital Stay: Payer: Medicare Other | Admitting: Anesthesiology

## 2019-12-21 ENCOUNTER — Encounter: Payer: Self-pay | Admitting: Internal Medicine

## 2019-12-21 DIAGNOSIS — Z794 Long term (current) use of insulin: Secondary | ICD-10-CM

## 2019-12-21 DIAGNOSIS — E119 Type 2 diabetes mellitus without complications: Secondary | ICD-10-CM

## 2019-12-21 DIAGNOSIS — K529 Noninfective gastroenteritis and colitis, unspecified: Secondary | ICD-10-CM

## 2019-12-21 DIAGNOSIS — E871 Hypo-osmolality and hyponatremia: Secondary | ICD-10-CM

## 2019-12-21 DIAGNOSIS — I10 Essential (primary) hypertension: Secondary | ICD-10-CM

## 2019-12-21 HISTORY — PX: ESOPHAGOGASTRODUODENOSCOPY: SHX5428

## 2019-12-21 LAB — BASIC METABOLIC PANEL
Anion gap: 8 (ref 5–15)
BUN: 21 mg/dL (ref 8–23)
CO2: 22 mmol/L (ref 22–32)
Calcium: 7.7 mg/dL — ABNORMAL LOW (ref 8.9–10.3)
Chloride: 105 mmol/L (ref 98–111)
Creatinine, Ser: 0.88 mg/dL (ref 0.61–1.24)
GFR calc Af Amer: >60 mL/min (ref >60)
GFR calc non Af Amer: >60 mL/min (ref >60)
Glucose, Bld: 118 mg/dL — ABNORMAL HIGH (ref 70–99)
Potassium: 3.5 mmol/L (ref 3.5–5.1)
Sodium: 135 mmol/L (ref 135–145)

## 2019-12-21 LAB — GLUCOSE, CAPILLARY
Glucose-Capillary: 127 mg/dL — ABNORMAL HIGH (ref 70–99)
Glucose-Capillary: 167 mg/dL — ABNORMAL HIGH (ref 70–99)
Glucose-Capillary: 178 mg/dL — ABNORMAL HIGH (ref 70–99)
Glucose-Capillary: 180 mg/dL — ABNORMAL HIGH (ref 70–99)
Glucose-Capillary: 273 mg/dL — ABNORMAL HIGH (ref 70–99)
Glucose-Capillary: 298 mg/dL — ABNORMAL HIGH (ref 70–99)

## 2019-12-21 LAB — CBC
HCT: 34.5 % — ABNORMAL LOW (ref 39.0–52.0)
Hemoglobin: 11.8 g/dL — ABNORMAL LOW (ref 13.0–17.0)
MCH: 31.2 pg (ref 26.0–34.0)
MCHC: 34.2 g/dL (ref 30.0–36.0)
MCV: 91.3 fL (ref 80.0–100.0)
Platelets: 273 10*3/uL (ref 150–400)
RBC: 3.78 MIL/uL — ABNORMAL LOW (ref 4.22–5.81)
RDW: 12.7 % (ref 11.5–15.5)
WBC: 11.8 10*3/uL — ABNORMAL HIGH (ref 4.0–10.5)
nRBC: 0 % (ref 0.0–0.2)

## 2019-12-21 LAB — PROTIME-INR
INR: 1.3 — ABNORMAL HIGH (ref 0.8–1.2)
Prothrombin Time: 15.7 seconds — ABNORMAL HIGH (ref 11.4–15.2)

## 2019-12-21 LAB — PROCALCITONIN: Procalcitonin: 7.19 ng/mL

## 2019-12-21 LAB — ALBUMIN: Albumin: 3.2 g/dL — ABNORMAL LOW (ref 3.5–5.0)

## 2019-12-21 LAB — HEMOGLOBIN A1C
Hgb A1c MFr Bld: 11 % — ABNORMAL HIGH (ref 4.8–5.6)
Mean Plasma Glucose: 269 mg/dL

## 2019-12-21 LAB — SARS CORONAVIRUS 2 (TAT 6-24 HRS): SARS Coronavirus 2: NEGATIVE

## 2019-12-21 LAB — CORTISOL-AM, BLOOD: Cortisol - AM: 10.9 ug/dL (ref 6.7–22.6)

## 2019-12-21 LAB — HIV ANTIBODY (ROUTINE TESTING W REFLEX): HIV Screen 4th Generation wRfx: NONREACTIVE

## 2019-12-21 LAB — LACTIC ACID, PLASMA: Lactic Acid, Venous: 0.8 mmol/L (ref 0.5–1.9)

## 2019-12-21 SURGERY — EGD (ESOPHAGOGASTRODUODENOSCOPY)
Anesthesia: General

## 2019-12-21 MED ORDER — PROPOFOL 10 MG/ML IV BOLUS
INTRAVENOUS | Status: DC | PRN
  Administered 2019-12-21 (×2): 50 mg via INTRAVENOUS

## 2019-12-21 MED ORDER — INSULIN ASPART 100 UNIT/ML ~~LOC~~ SOLN
8.0000 [IU] | Freq: Once | SUBCUTANEOUS | Status: AC
  Administered 2019-12-21: 8 [IU] via SUBCUTANEOUS
  Filled 2019-12-21: qty 1

## 2019-12-21 MED ORDER — INSULIN ASPART 100 UNIT/ML ~~LOC~~ SOLN
0.0000 [IU] | Freq: Three times a day (TID) | SUBCUTANEOUS | Status: DC
  Administered 2019-12-22 (×2): 8 [IU] via SUBCUTANEOUS
  Administered 2019-12-22 – 2019-12-23 (×3): 5 [IU] via SUBCUTANEOUS
  Filled 2019-12-21 (×6): qty 1

## 2019-12-21 MED ORDER — FENTANYL CITRATE (PF) 100 MCG/2ML IJ SOLN: 25.0000 ug | INTRAMUSCULAR | Status: DC | PRN

## 2019-12-21 MED ORDER — PROPOFOL 10 MG/ML IV BOLUS
INTRAVENOUS | Status: AC
  Filled 2019-12-21: qty 20

## 2019-12-21 MED ORDER — SODIUM CHLORIDE 0.9 % IV SOLN: INTRAVENOUS | Status: DC

## 2019-12-21 MED ORDER — LEVOFLOXACIN IN D5W 750 MG/150ML IV SOLN
750.0000 mg | INTRAVENOUS | Status: DC
  Administered 2019-12-22 – 2019-12-23 (×2): 750 mg via INTRAVENOUS
  Filled 2019-12-21 (×2): qty 150

## 2019-12-21 MED ORDER — PANTOPRAZOLE SODIUM 40 MG PO TBEC
40.0000 mg | DELAYED_RELEASE_TABLET | Freq: Every day | ORAL | Status: DC
  Administered 2019-12-21 – 2019-12-23 (×3): 40 mg via ORAL
  Filled 2019-12-21 (×4): qty 1

## 2019-12-21 MED ORDER — ONDANSETRON HCL 4 MG/2ML IJ SOLN: 4.0000 mg | Freq: Once | INTRAMUSCULAR | Status: DC | PRN

## 2019-12-21 NOTE — Anesthesia Preprocedure Evaluation (Addendum)
Anesthesia Evaluation  Patient identified by MRN, date of birth, ID band Patient awake    Reviewed: Allergy & Precautions, NPO status , Patient's Chart, lab work & pertinent test results  Airway Mallampati: II       Dental   Pulmonary neg pulmonary ROS,    Pulmonary exam normal        Cardiovascular hypertension, + CAD, + Past MI and + Peripheral Vascular Disease  Normal cardiovascular exam     Neuro/Psych TIACVA negative psych ROS   GI/Hepatic Neg liver ROS, Hx of SBO   Endo/Other  diabetes  Renal/GU Renal InsufficiencyRenal disease  negative genitourinary   Musculoskeletal   Abdominal Normal abdominal exam  (+)   Peds negative pediatric ROS (+)  Hematology  (+) anemia ,   Anesthesia Other Findings Past Medical History: 11/2005: Acute cerebral infarction ([facility])     Comment:  right caudate internal capsule secondary to small vessel No date: CAD (coronary artery disease)     Comment:  s/p MI in distant past (1995?); TTE 2010 - EF 55-60% No date: Chronic kidney disease     Comment:  per pt, had kidney disease 43-41 years old No date: Diabetes mellitus without complication ([place]) No date: Hyperlipidemia No date: Hypertension     Comment:  fluctuates 2006/2007: Lacunar infarction [facility])     Comment:  Right basal ganglion, chronic lacunar infarct No date: Myocardial infarction [facility])     Comment:  ?1995 09/2010: SBO (small bowel obstruction) ([place])     Comment:  Resolved with conservative measures/ per pt has had 2               SBO! No date: Stenosis of right carotid artery 2010: Stroke [facility]) 06/2009: TIA (transient ischemic attack)  Reproductive/Obstetrics                            Anesthesia Physical Anesthesia Plan  ASA: III  Anesthesia Plan: General   Post-op Pain Management:    Induction: Intravenous  PONV Risk Score and Plan:   Airway Management Planned: Nasal  Cannula  Additional Equipment:   Intra-op Plan:   Post-operative Plan:   Informed Consent: I have reviewed the patients History and Physical, chart, labs and discussed the procedure including the risks, benefits and alternatives for the proposed anesthesia with the patient or authorized representative who has indicated his/her understanding and acceptance.     Dental advisory given  Plan Discussed with: CRNA and Surgeon  Anesthesia Plan Comments:         Anesthesia Quick Evaluation

## 2019-12-21 NOTE — H&P (View-Only) (Signed)
GI Inpatient Consult Note  Reason for Consult: Hematemesis, peptic ulcer disease   Attending Requesting Consult: Dr. Lorella Nimrod, MD  History of Present Illness: [name] is a 69 y.o. male seen for evaluation of melena at the request of Dr. Reesa Chew. Pt has a PMH of CAD, CKD, T2DM, Hx of CVA, and Hx of SBO who presented to the ED for 1-day history of nausea and vomiting, dizziness, and headaches. He reports when he woke up yesterday he was feeling much weaker and dizzy and had three episodes of dark emesis. Upon arrival to the ED, he had low grade temp of 99.1, hypotensive at 76/49, and blood work showing leukocytosis 24K, and normal hemoglobin 14.3. CT abd/pelvis in ED showed gastric antral wall thickening and irregularity, concerning for underlying gastric ulcer vs gastritis and nodular liver contour. CBC this morning showed hemoglobin 11.8 and WBC 11.8. He reports he has never had an endoscopy and has never been treated for peptic ulcer disease or gastritis. He does report a longstanding history of GERD symptoms over the past 5 years with almost daily symptoms of pyrosis, acid regurgitation, and sour brash. He reports he does drink daily Dr. Lyndee Hensen and does have a chronic use of BC Powders for headaches. He reports he takes an average of 50 BC powders per week. He does not have a primary care physician. He reports he feels better today after receiving IVF and antibiotics. He denies any fever, nausea, vomiting, dysphagia, or abdominal pain. He denies any hematochezia or melena. No known family hx of esophageal or gastric adenocarcinoma.    Last Colonoscopy: 11/2017 - 2 mm polyp in proximal ascending colon with path showing tubular adenoma wo dysplasia Last Endoscopy: N/A   Past Medical History:  Past Medical History:  Diagnosis Date   Acute cerebral infarction ([place]) 11/2005   right caudate internal capsule secondary to small vessel   CAD (coronary artery disease)    s/p MI in distant past  (1995?); TTE 2010 - EF 55-60%   Chronic kidney disease    per pt, had kidney disease 11-67 years old   Diabetes mellitus without complication ([place])    Hyperlipidemia    Hypertension    fluctuates   Lacunar infarction ([place]) 2006/2007   Right basal ganglion, chronic lacunar infarct   Myocardial infarction [facility])    ?1995   SBO (small bowel obstruction) ([place]) 09/2010   Resolved with conservative measures/ per pt has had 2 SBO!   Stenosis of right carotid artery    Stroke [facility]) 2010   TIA (transient ischemic attack) 06/2009    Problem List: Patient Active Problem List   Diagnosis Date Noted   Sepsis ([place]) 12/20/2019   Gastritis 12/20/2019   Hyponatremia 12/20/2019   AKI (acute kidney injury) ([place]) 12/20/2019   Essential hypertension 12/20/2019   History of CVA (cerebrovascular accident) 12/20/2019   Hyperglycemia due to type 2 diabetes mellitus ([place]) 12/20/2019   Nonrheumatic tricuspid valve regurgitation 12/12/2018   Small bowel obstruction ([place]) 06/08/2013   Vomiting 06/08/2013   Vertigo, benign positional 01/06/2012    Past Surgical History: Past Surgical History:  Procedure Laterality Date   broken collarbone     right collarbone   broken shoulder     right shoulder   FINGER SURGERY     right hand, tip of finger mashed and broken fingers   LAPAROSCOPIC CHOLECYSTECTOMY  2009   by Dr. Dalbert Batman    Allergies: Allergies  Allergen Reactions   Bee Venom  Anaphylaxis   Penicillins Anaphylaxis    Home Medications: Facility-Administered Medications Prior to Admission  Medication Dose Route Frequency Provider Last Rate Last Admin   0.9 %  sodium chloride infusion  500 mL Intravenous Once Irene Shipper, MD       Medications Prior to Admission  Medication Sig Dispense Refill Last Dose   gabapentin (NEURONTIN) 600 MG tablet Take 600-1,200 mg by mouth 2 (two) times daily. Take 1 tablet by mouth in the morning and 2 tablets in the evening   12/20/2019 at 0800   LEVEMIR FLEXTOUCH  100 UNIT/ML Pen 48 UNITS IN THE MORNING AND 20 MINUTES AT NIGHT  3 12/20/2019 at 0800   losartan-hydrochlorothiazide (HYZAAR) 100-25 MG tablet Take 1 tablet by mouth daily.   12/20/2019 at Unknown time   metFORMIN (GLUCOPHAGE) 1000 MG tablet Take 1,000 mg by mouth 2 (two) times daily.  3 12/20/2019 at 0800   rosuvastatin (CRESTOR) 10 MG tablet Take 10 mg by mouth at bedtime.   12/19/2019 at Unknown time   sildenafil (REVATIO) 20 MG tablet TAKE 1 TABLET BY MOUTH prior TO sexual activity prn  3 prn at prn   verapamil (VERELAN PM) 240 MG 24 hr capsule Take 240 mg by mouth daily.   5 12/20/2019 at 0800   aspirin EC 81 MG tablet Take 81 mg by mouth daily. For pain   Not Taking at Unknown time   BD PEN NEEDLE MICRO U/F 32G X 6 MM MISC Use pen needles for insulin injection each evening  11    glimepiride (AMARYL) 1 MG tablet Take 1 tablet (1 mg total) by mouth daily with breakfast. (Patient not taking: Reported on 11/27/2017) 30 tablet 0 Not Taking at Unknown time   meloxicam (MOBIC) 15 MG tablet Take 15 mg by mouth daily.  3 Not Taking at Unknown time   Home medication reconciliation was completed with the patient.   Scheduled Inpatient Medications:    insulin aspart  0-15 Units Subcutaneous Q4H   pantoprazole (PROTONIX) IV  40 mg Intravenous Q24H    Continuous Inpatient Infusions:    sodium chloride 100 mL/hr at 12/21/19 0839   levofloxacin (LEVAQUIN) IV 750 mg (12/21/19 [id number])   metronidazole 500 mg (12/21/19 0846)    PRN Inpatient Medications:  acetaminophen **OR** acetaminophen, morphine injection, ondansetron **OR** ondansetron (ZOFRAN) IV  Family History: family history includes Emphysema in his mother and sister; Heart failure in his father; Lung cancer in his mother.  The patient's family history is negative for inflammatory bowel disorders, GI malignancy, or solid organ transplantation.  Social History:   reports that he has never smoked. He has never used smokeless tobacco. He reports  that he does not drink alcohol or use drugs. The patient denies ETOH, tobacco, or drug use.   Review of Systems: Constitutional: Weight is stable.  Eyes: No changes in vision. ENT: No oral lesions, sore throat.  GI: see HPI.  Heme/Lymph: No easy bruising.  CV: No chest pain.  GU: No hematuria.  Integumentary: No rashes.  Neuro: No headaches.  Psych: No depression/anxiety.  Endocrine: No heat/cold intolerance.  Allergic/Immunologic: No urticaria.  Resp: No cough, SOB.  Musculoskeletal: No joint swelling.    Physical Examination: BP 119/79 (BP Location: Left Arm)   Pulse 80   Temp 98 F (36.7 C) (Oral)   Resp 18   Ht 5\' 4"  (1.626 m)   Wt 74.8 kg   SpO2 97%   BMI 28.32 kg/m  Gen: NAD, alert  and oriented x 4 HEENT: PEERLA, EOMI, Neck: supple, no JVD or thyromegaly Chest: CTA bilaterally, no wheezes, crackles, or other adventitious sounds CV: RRR, no m/g/c/r Abd: soft, mild distention, nontender to palpation in all four quadrants, +BS in all four quadrants; no HSM, guarding, ridigity, or rebound tenderness Ext: no edema, well perfused with 2+ pulses, Skin: no rash or lesions noted Lymph: no LAD  Data: Lab Results  Component Value Date   WBC 11.8 (H) 12/21/2019   HGB 11.8 (L) 12/21/2019   HCT 34.5 (L) 12/21/2019   MCV 91.3 12/21/2019   PLT 273 12/21/2019   Recent Labs  Lab 12/20/19 1603 12/20/19 2205 12/21/19 0435  HGB 14.3 12.2* 11.8*   Lab Results  Component Value Date   NA 135 12/21/2019   K 3.5 12/21/2019   CL 105 12/21/2019   CO2 22 12/21/2019   BUN 21 12/21/2019   CREATININE 0.88 12/21/2019   Lab Results  Component Value Date   ALT 35 12/20/2019   AST 34 12/20/2019   ALKPHOS 93 12/20/2019   BILITOT 1.4 (H) 12/20/2019   Recent Labs  Lab 12/20/19 1603 12/20/19 1603 12/21/19 0435  APTT 27  --   --   INR 1.2   < > 1.3*   < > = values in this interval not displayed.   Assessment/Plan:  69 y/o Caucasian male with a PMH of CAD, CKD, T2DM, Hx  of CVA, and Hx of SBO managed conservatively presented to the ED presented to the ED for one-day history of nausea/vomiting x3 and weakness  1. Sepsis - leukocytosis, hypotension, elevated lactic acid. Received IV fluids and empiric broad spectrum antibiotics  2. Abnormal CT - gastritis versus peptic ulcer disease  3. Overuse of NSAIDs - average of 50 BC powders per week  4. GERD  5. Acute blood loss anemia - 2.5 gram drop in hemoglobin overnight with three episodes of dark emesis  6. Cirrhosis - nodular contour seen on CT, LFTs within normal limits, no signs of decompensated liver disease at present   - Concern for UGI bleed from gastric ulcer versus gastritis. DDx also includes esophagitis, Dieulafoy's lesion, duodenitis, varices, Mallory-Weiss tear, etc -Advise EGD today for diagnostic and potential therapeutic purposes for prevention of recurrence. We discussed procedure details and indications in office today.  -Continue acid suppression -We discussed importance of stopping BC powders and the negative effects on gastric mucosa -NPO -Plan for EGD this afternoon with Dr. Alice Reichert -See procedure note for findings and further recommendations    I reviewed the risks (including bleeding, perforation, infection, anesthesia complications, cardiac/respiratory complications), benefits and alternatives of EGD. Patient consents to proceed.     Thank you for the consult. Please call with questions or concerns.  Reeves Forth [facility] [phone] (Cell)

## 2019-12-21 NOTE — Progress Notes (Signed)
PROGRESS NOTE    [name]  HLK:[id number] DOB: Mar 02, 1951 DOA: 12/20/2019 PCP: Patient, No Pcp Per   Brief Narrative:  [name] is a 69 y.o. male with medical history significant for insulin-dependent type 2 diabetes, hypertension, history of CVA as well as history of small bowel obstruction treated conservatively who presents to the emergency room with a complaint of generalized weakness and vomiting.  CT abdomen and pelvis showed antral wall thickening, gastritis or ulcer with recommendation for consideration of EGD.  It also showed a nodular liver. He was hypotensive on presentation with BP of 76/49 which improved to 112/71 by admission with IV fluid.  Started on Levaquin and Flagyl for possible intra-abdominal infection.  Subjective: Patient was feeling little better when seen during morning rounds.  No more nausea, vomiting or any diarrhea.  Denies any abdominal pain.  Waiting for EGD. Patient denies any recent upper respiratory symptoms or sick contacts.  He cut himself with some metal wire while working with recyclable material approximately 2 weeks ago.  Denies any purulent discharge from lacerations, stating that his wounds normally takes time to heal due to diabetes.  No pain at laceration site.  Later received a message from nursing staff that patient had some tingling in his left arm lasted for couple of minutes and resolved spontaneously.  No focal deficit.  Assessment & Plan:   Principal Problem:   Sepsis ([place]) Active Problems:   Gastritis   Hyponatremia   AKI (acute kidney injury) ([place])   Essential hypertension   History of CVA (cerebrovascular accident)   Hyperglycemia due to type 2 diabetes mellitus ([place])  Sepsis ([place]) suspect GI source/gastric ulcer/GI bleed.  CT with possible gastritis/gastric ulcer.  No obvious bleeding, questionable coffee-ground emesis. Mild decrease in hemoglobin, can be dilutional as patient received IV fluid.   Prior history of  SBO treated conservatively. Initially met sepsis criteria being hypotensive, leukocytosis, elevated PCT and lactic acid.  Blood pressure improved with IV fluid.  Leukocytosis improving.  Actiq acidosis resolved.  Remained afebrile.  Blood culture negative so far. History of recent skin lacerations with a wire while working with recyclable material, left forearm with healing 3 lacerations with some surrounding erythema, no drainage. -Continue IV Flagyl and Levaquin. -Repeat procalcitonin tomorrow. -Continue IV Protonix. -GI consult-going for EGD today.   Nausea and tingling of left forearm.  Lasted for couple of minutes per patient.  Spontaneously resolved.  No focal neurologic deficit. -BMP this morning with calcium of 7.7, it was 8.7 yesterday-we will check albumin for corrected calcium.  No other electrolyte abnormalities. -Continue to monitor.  Mild hyponatremia.  Resolved.  Initially presented with corrected sodium of 133 most likely secondary to hypovolemia and GI losses. -Continue to monitor.  AKI.  Resolved.  Most likely secondary to dehydration. -Continue to monitor  Hyperglycemia due to type 2 diabetes mellitus ([place]).  Uncontrolled with A1c of 11.  CBG improved. -Continue every 4 hours sliding scale.  Essential hypertension.  Pressure within goal now. -Continue to hold home dose of antihypertensives-can be restarted if blood pressure elevated.  History of CVA (cerebrovascular accident) -Hold aspirin given concern for possible GI bleed -Not currently on statin.  Objective: Vitals:   12/21/19 0251 12/21/19 0455 12/21/19 0557 12/21/19 0743  BP: 97/62 108/70 107/68 115/80  Pulse: 70 72 73 77  Resp:  '17 17 18  ' Temp: 98.2 F (36.8 C) 97.8 F (36.6 C) 98.1 F (36.7 C)   TempSrc: Oral Oral Oral  SpO2: 96% 95% 95% 94%  Weight:      Height:        Intake/Output Summary (Last 24 hours) at 12/21/2019 0748 Last data filed at 12/21/2019 0417 Gross per 24 hour  Intake 570.1 ml   Output 400 ml  Net 170.1 ml   Filed Weights   12/20/19 1957  Weight: 74.8 kg    Examination:  General exam: Well-developed gentleman, appears calm and comfortable  Respiratory system: Clear to auscultation. Respiratory effort normal. Cardiovascular system: S1 & S2 heard, RRR. No JVD, murmurs, rubs, gallops or clicks. Gastrointestinal system: Soft, nontender, nondistended, bowel sounds positive. Central nervous system: Alert and oriented. No focal neurological deficits.Symmetric 5 x 5 power. Extremities: No edema, no cyanosis, pulses intact and symmetrical. Skin: 3 small healing lacerations on left forearm with mild surrounding erythema, no sign of infection. Psychiatry: Judgement and insight appear normal. Mood & affect appropriate.    DVT prophylaxis: SCDs Code Status: Full Family Communication: No family at bedside. Disposition Plan: Pending improvement, will go back home.  Consultants:   GI  Procedures:   Antimicrobials:  Levaquin Flagyl  Data Reviewed: I have personally reviewed following labs and imaging studies  CBC: Recent Labs  Lab 12/20/19 1603 12/20/19 2205 12/21/19 0435  WBC 24.1*  --  11.8*  NEUTROABS 20.6*  --   --   HGB 14.3 12.2* 11.8*  HCT 40.7  --  34.5*  MCV 89.3  --  91.3  PLT 354  --  956   Basic Metabolic Panel: Recent Labs  Lab 12/20/19 1603 12/21/19 0435  NA 130* 135  K 4.8 3.5  CL 96* 105  CO2 23 22  GLUCOSE 279* 118*  BUN 32* 21  CREATININE 1.43* 0.88  CALCIUM 8.7* 7.7*   GFR: Estimated Creatinine Clearance: 76.4 mL/min (by C-G formula based on SCr of 0.88 mg/dL). Liver Function Tests: Recent Labs  Lab 12/20/19 1603  AST 34  ALT 35  ALKPHOS 93  BILITOT 1.4*  PROT 8.2*  ALBUMIN 4.3   Recent Labs  Lab 12/20/19 2205  LIPASE 20   No results for input(s): AMMONIA in the last 168 hours. Coagulation Profile: Recent Labs  Lab 12/20/19 1603 12/21/19 0435  INR 1.2 1.3*   Cardiac Enzymes: No results for  input(s): CKTOTAL, CKMB, CKMBINDEX, TROPONINI in the last 168 hours. BNP (last 3 results) No results for input(s): PROBNP in the last 8760 hours. HbA1C: Recent Labs    12/20/19 2205  HGBA1C 11.0*   CBG: Recent Labs  Lab 12/20/19 2200 12/21/19 0016 12/21/19 0310 12/21/19 0742  GLUCAP 258* 178* 127* 180*   Lipid Profile: No results for input(s): CHOL, HDL, LDLCALC, TRIG, CHOLHDL, LDLDIRECT in the last 72 hours. Thyroid Function Tests: No results for input(s): TSH, T4TOTAL, FREET4, T3FREE, THYROIDAB in the last 72 hours. Anemia Panel: No results for input(s): VITAMINB12, FOLATE, FERRITIN, TIBC, IRON, RETICCTPCT in the last 72 hours. Sepsis Labs: Recent Labs  Lab 12/20/19 1624 12/21/19 0435  PROCALCITON  --  7.19  LATICACIDVEN 2.1*  --     Recent Results (from the past 240 hour(s))  Blood culture (routine x 2)     Status: None (Preliminary result)   Collection Time: 12/20/19  4:24 PM   Specimen: BLOOD  Result Value Ref Range Status   Specimen Description BLOOD LEFT ANTECUBITAL  Final   Special Requests   Final    BOTTLES DRAWN AEROBIC AND ANAEROBIC Blood Culture results may not be optimal due to an inadequate  volume of blood received in culture bottles   Culture   Final    NO GROWTH < 12 HOURS Performed at [facility], [address] [place] [address]    Report Status PENDING  Incomplete  Blood culture (routine x 2)     Status: None (Preliminary result)   Collection Time: 12/20/19  5:27 PM   Specimen: BLOOD  Result Value Ref Range Status   Specimen Description BLOOD LT [facility]  Final   Special Requests   Final    BOTTLES DRAWN AEROBIC AND ANAEROBIC Blood Culture results may not be optimal due to an excessive volume of blood received in culture bottles   Culture   Final    NO GROWTH < 12 HOURS Performed at [facility], [address] [place], [place] [address]    Report Status PENDING  Incomplete  SARS CORONAVIRUS 2 (TAT 6-24 HRS)  Nasopharyngeal     Status: None   Collection Time: 12/20/19  9:22 PM   Specimen: Nasopharyngeal  Result Value Ref Range Status   SARS Coronavirus 2 NEGATIVE NEGATIVE Final    Comment: (NOTE) SARS-CoV-2 target nucleic acids are NOT DETECTED. The SARS-CoV-2 RNA is generally detectable in upper and lower respiratory specimens during the acute phase of infection. Negative results do not preclude SARS-CoV-2 infection, do not rule out co-infections with other pathogens, and should not be used as the sole basis for treatment or other patient management decisions. Negative results must be combined with clinical observations, patient history, and epidemiological information. The expected result is Negative. Fact Sheet for Patients: SugarRoll.be Fact Sheet for Healthcare Providers: https://www.woods-mathews.com/ This test is not yet approved or cleared by the [place] FDA and  has been authorized for detection and/or diagnosis of SARS-CoV-2 by FDA under an Emergency Use Authorization (EUA). This EUA will remain  in effect (meaning this test can be used) for the duration of the COVID-19 declaration under Section 56 4(b)(1) of the Act, 21 U.S.C. section 360bbb-3(b)(1), unless the authorization is terminated or revoked sooner. Performed at [facility], [place] [address] [place], [place] [address]      Radiology Studies: CT HEAD WO CONTRAST  Result Date: 12/20/2019 CLINICAL DATA:  Focal neuro deficit. Stroke suspected EXAM: CT HEAD WITHOUT CONTRAST TECHNIQUE: Contiguous axial images were obtained from the base of the skull through the vertex without intravenous contrast. COMPARISON:  06/10/2013 FINDINGS: Brain: No evidence of acute infarction, hemorrhage, hydrocephalus, extra-axial collection or mass lesion/mass effect. Mild atrophy and some chronic microvascular ischemic changes. Vascular: No hyperdense vessel or unexpected calcification. Skull:  Normal. Negative for fracture or focal lesion. Sinuses/Orbits: Visualized paranasal sinuses and orbits are unremarkable. Other: None. IMPRESSION: No acute intracranial findings. Signs of prior infarcts in the right basal ganglia and evidence of atrophy. Electronically Signed   By: Zetta Bills M.D.   On: 12/20/2019 17:11   CT ABDOMEN PELVIS W CONTRAST  Result Date: 12/20/2019 CLINICAL DATA:  Abdominal distension and hypotension. EXAM: CT ABDOMEN AND PELVIS WITH CONTRAST TECHNIQUE: Multidetector CT imaging of the abdomen and pelvis was performed using the standard protocol following bolus administration of intravenous contrast. CONTRAST:  137m OMNIPAQUE IOHEXOL 300 MG/ML  SOLN COMPARISON:  06/07/2013 FINDINGS: Lower chest: Signs of basilar atelectasis. No signs of consolidation or pleural effusion. Hepatobiliary: No signs of focal, suspicious hepatic lesion. Lobular hepatic contours and nodular liver surface. Post cholecystectomy without ductal dilation. Portal vein is patent. Pancreas: Pancreas is normal without focal inflammation or ductal dilation.  Spleen: Spleen is normal size without focal lesion. Adrenals/Urinary Tract: Adrenal glands are normal. No signs of hydronephrosis or suspicious renal lesion. Urinary bladder is normal. Stomach/Bowel: Signs of gastric antral thickening and irregularity. Best seen on image 28 of series 2. No signs of bowel obstruction. Appendix is normal, in the right lower quadrant. Colon is stool filled throughout much of its course. Vascular/Lymphatic: Vascular structures in the abdomen are patent. No signs of adenopathy but with numerous small lymph nodes in the retroperitoneum and upper abdomen. No signs of pelvic adenopathy. Reproductive: Prostate mildly enlarged. Heterogeneous, nonspecific finding on CT. Other: Small fat containing inguinal hernias. No free air. No ascites. Musculoskeletal: No acute bone process. IMPRESSION: 1. Signs of gastric antral wall thickening and  irregularity. Findings are concerning for gastritis or ulcer disease, consider endoscopic correlation on follow-up. 2. Lobular hepatic contours and nodular liver surface suggests underlying liver disease. Correlate with clinical or laboratory evidence of liver dysfunction. 3. Post cholecystectomy without ductal dilation. 4. Small fat containing inguinal hernias. 5. Mildly enlarged heterogeneous prostate gland. Correlate with physical examination and PSA. 6. Constipation. Electronically Signed   By: Zetta Bills M.D.   On: 12/20/2019 17:24   DG Chest Port 1 View  Result Date: 12/20/2019 CLINICAL DATA:  69 year old male with altered mental status. EXAM: PORTABLE CHEST 1 VIEW COMPARISON:  Chest radiograph dated 03/26/2019. FINDINGS: Mild chronic interstitial coarsening and bronchitic changes. No focal consolidation, pleural effusion, or pneumothorax. Top-normal cardiac silhouette. No acute osseous pathology. Biapical subpleural plaques noted. IMPRESSION: No acute cardiopulmonary process. Electronically Signed   By: Anner Crete M.D.   On: 12/20/2019 16:47   DG Abd Portable 1 View  Result Date: 12/20/2019 CLINICAL DATA:  69 year old male with abdominal pain. EXAM: PORTABLE ABDOMEN - 1 VIEW COMPARISON:  CT abdomen pelvis dated 06/07/2013. FINDINGS: There is moderate stool throughout the colon. No bowel dilatation or evidence of obstruction. No free air or radiopaque calculi. Right upper quadrant cholecystectomy clips. There is mild osteopenia and degenerative changes of the spine. No acute osseous pathology. IMPRESSION: No acute findings. Electronically Signed   By: Anner Crete M.D.   On: 12/20/2019 16:49    Scheduled Meds: . insulin aspart  0-15 Units Subcutaneous Q4H  . pantoprazole (PROTONIX) IV  40 mg Intravenous Q24H   Continuous Infusions: . sodium chloride 100 mL/hr at 12/21/19 0417  . levofloxacin (LEVAQUIN) IV 750 mg (12/21/19 2518)  . metronidazole Stopped (12/21/19 0124)      LOS: 1 day   Time spent: 40 minutes  Lorella Nimrod, MD [facility]  If 7PM-7AM, please contact night-coverage [email or web address]  12/21/2019, 7:48 AM   This record has been created using Systems analyst. Errors have been sought and corrected,but may not always be located. Such creation errors do not reflect on the standard of care.

## 2019-12-21 NOTE — Consult Note (Addendum)
GI Inpatient Consult Note  Reason for Consult: Hematemesis, peptic ulcer disease   Attending Requesting Consult: Dr. Lorella Nimrod, MD  History of Present Illness: [name] is a 69 y.o. male seen for evaluation of melena at the request of [name]. Pt has a PMH of CAD, CKD, T2DM, Hx of CVA, and Hx of SBO who presented to the ED for 1-day history of nausea and vomiting, dizziness, and headaches. He reports when he woke up yesterday he was feeling much weaker and dizzy and had three episodes of dark emesis. Upon arrival to the ED, he had low grade temp of 99.1, hypotensive at 76/49, and blood work showing leukocytosis 24K, and normal hemoglobin 14.3. CT abd/pelvis in ED showed gastric antral wall thickening and irregularity, concerning for underlying gastric ulcer vs gastritis and nodular liver contour. CBC this morning showed hemoglobin 11.8 and WBC 11.8. He reports he has never had an endoscopy and has never been treated for peptic ulcer disease or gastritis. He does report a longstanding history of GERD symptoms over the past 5 years with almost daily symptoms of pyrosis, acid regurgitation, and sour brash. He reports he does drink daily Dr. Lyndee Hensen and does have a chronic use of BC Powders for headaches. He reports he takes an average of 50 BC powders per week. He does not have a primary care physician. He reports he feels better today after receiving IVF and antibiotics. He denies any fever, nausea, vomiting, dysphagia, or abdominal pain. He denies any hematochezia or melena. No known family hx of esophageal or gastric adenocarcinoma.    Last Colonoscopy: 11/2017 - 2 mm polyp in proximal ascending colon with path showing tubular adenoma wo dysplasia Last Endoscopy: N/A   Past Medical History:  Past Medical History:  Diagnosis Date   Acute cerebral infarction ([place]) 11/2005   right caudate internal capsule secondary to small vessel   CAD (coronary artery disease)    s/p MI in distant past  (1995?); TTE 2010 - EF 55-60%   Chronic kidney disease    per pt, had kidney disease 35-54 years old   Diabetes mellitus without complication ([place])    Hyperlipidemia    Hypertension    fluctuates   Lacunar infarction ([place]) 2006/2007   Right basal ganglion, chronic lacunar infarct   Myocardial infarction [facility])    ?1995   SBO (small bowel obstruction) ([place]) 09/2010   Resolved with conservative measures/ per pt has had 2 SBO!   Stenosis of right carotid artery    Stroke [facility]) 2010   TIA (transient ischemic attack) 06/2009    Problem List: Patient Active Problem List   Diagnosis Date Noted   Sepsis ([place]) 12/20/2019   Gastritis 12/20/2019   Hyponatremia 12/20/2019   AKI (acute kidney injury) ([place]) 12/20/2019   Essential hypertension 12/20/2019   History of CVA (cerebrovascular accident) 12/20/2019   Hyperglycemia due to type 2 diabetes mellitus ([place]) 12/20/2019   Nonrheumatic tricuspid valve regurgitation 12/12/2018   Small bowel obstruction ([place]) 06/08/2013   Vomiting 06/08/2013   Vertigo, benign positional 01/06/2012    Past Surgical History: Past Surgical History:  Procedure Laterality Date   broken collarbone     right collarbone   broken shoulder     right shoulder   FINGER SURGERY     right hand, tip of finger mashed and broken fingers   LAPAROSCOPIC CHOLECYSTECTOMY  2009   by [name]    Allergies: Allergies  Allergen Reactions   Bee Venom  Anaphylaxis   Penicillins Anaphylaxis    Home Medications: Facility-Administered Medications Prior to Admission  Medication Dose Route Frequency Provider Last Rate Last Admin   0.9 %  sodium chloride infusion  500 mL Intravenous Once Duane Shipper, MD       Medications Prior to Admission  Medication Sig Dispense Refill Last Dose   gabapentin (NEURONTIN) 600 MG tablet Take 600-1,200 mg by mouth 2 (two) times daily. Take 1 tablet by mouth in the morning and 2 tablets in the evening   12/20/2019 at 0800   LEVEMIR FLEXTOUCH  100 UNIT/ML Pen 48 UNITS IN THE MORNING AND 20 MINUTES AT NIGHT  3 12/20/2019 at 0800   losartan-hydrochlorothiazide (HYZAAR) 100-25 MG tablet Take 1 tablet by mouth daily.   12/20/2019 at Unknown time   metFORMIN (GLUCOPHAGE) 1000 MG tablet Take 1,000 mg by mouth 2 (two) times daily.  3 12/20/2019 at 0800   rosuvastatin (CRESTOR) 10 MG tablet Take 10 mg by mouth at bedtime.   12/19/2019 at Unknown time   sildenafil (REVATIO) 20 MG tablet TAKE 1 TABLET BY MOUTH prior TO sexual activity prn  3 prn at prn   verapamil (VERELAN PM) 240 MG 24 hr capsule Take 240 mg by mouth daily.   5 12/20/2019 at 0800   aspirin EC 81 MG tablet Take 81 mg by mouth daily. For pain   Not Taking at Unknown time   BD PEN NEEDLE MICRO U/F 32G X 6 MM MISC Use pen needles for insulin injection each evening  11    glimepiride (AMARYL) 1 MG tablet Take 1 tablet (1 mg total) by mouth daily with breakfast. (Patient not taking: Reported on 11/27/2017) 30 tablet 0 Not Taking at Unknown time   meloxicam (MOBIC) 15 MG tablet Take 15 mg by mouth daily.  3 Not Taking at Unknown time   Home medication reconciliation was completed with the patient.   Scheduled Inpatient Medications:    insulin aspart  0-15 Units Subcutaneous Q4H   pantoprazole (PROTONIX) IV  40 mg Intravenous Q24H    Continuous Inpatient Infusions:    sodium chloride 100 mL/hr at 12/21/19 0839   levofloxacin (LEVAQUIN) IV 750 mg (12/21/19 [id number])   metronidazole 500 mg (12/21/19 0846)    PRN Inpatient Medications:  acetaminophen **OR** acetaminophen, morphine injection, ondansetron **OR** ondansetron (ZOFRAN) IV  Family History: family history includes Emphysema in his mother and sister; Heart failure in his father; Lung cancer in his mother.  The patient's family history is negative for inflammatory bowel disorders, GI malignancy, or solid organ transplantation.  Social History:   reports that he has never smoked. He has never used smokeless tobacco. He reports  that he does not drink alcohol or use drugs. The patient denies ETOH, tobacco, or drug use.   Review of Systems: Constitutional: Weight is stable.  Eyes: No changes in vision. ENT: No oral lesions, sore throat.  GI: see HPI.  Heme/Lymph: No easy bruising.  CV: No chest pain.  GU: No hematuria.  Integumentary: No rashes.  Neuro: No headaches.  Psych: No depression/anxiety.  Endocrine: No heat/cold intolerance.  Allergic/Immunologic: No urticaria.  Resp: No cough, SOB.  Musculoskeletal: No joint swelling.    Physical Examination: BP 119/79 (BP Location: Left Arm)   Pulse 80   Temp 98 F (36.7 C) (Oral)   Resp 18   Ht 5\' 4"  (1.626 m)   Wt 74.8 kg   SpO2 97%   BMI 28.32 kg/m  Gen: NAD, alert  and oriented x 4 HEENT: PEERLA, EOMI, Neck: supple, no JVD or thyromegaly Chest: CTA bilaterally, no wheezes, crackles, or other adventitious sounds CV: RRR, no m/g/c/r Abd: soft, mild distention, nontender to palpation in all four quadrants, +BS in all four quadrants; no HSM, guarding, ridigity, or rebound tenderness Ext: no edema, well perfused with 2+ pulses, Skin: no rash or lesions noted Lymph: no LAD  Data: Lab Results  Component Value Date   WBC 11.8 (H) 12/21/2019   HGB 11.8 (L) 12/21/2019   HCT 34.5 (L) 12/21/2019   MCV 91.3 12/21/2019   PLT 273 12/21/2019   Recent Labs  Lab 12/20/19 1603 12/20/19 2205 12/21/19 0435  HGB 14.3 12.2* 11.8*   Lab Results  Component Value Date   NA 135 12/21/2019   K 3.5 12/21/2019   CL 105 12/21/2019   CO2 22 12/21/2019   BUN 21 12/21/2019   CREATININE 0.88 12/21/2019   Lab Results  Component Value Date   ALT 35 12/20/2019   AST 34 12/20/2019   ALKPHOS 93 12/20/2019   BILITOT 1.4 (H) 12/20/2019   Recent Labs  Lab 12/20/19 1603 12/20/19 1603 12/21/19 0435  APTT 27  --   --   INR 1.2   < > 1.3*   < > = values in this interval not displayed.   Assessment/Plan:  69 y/o Caucasian male with a PMH of CAD, CKD, T2DM, Hx  of CVA, and Hx of SBO managed conservatively presented to the ED presented to the ED for one-day history of nausea/vomiting x3 and weakness  1. Sepsis - leukocytosis, hypotension, elevated lactic acid. Received IV fluids and empiric broad spectrum antibiotics  2. Abnormal CT - gastritis versus peptic ulcer disease  3. Overuse of NSAIDs - average of 50 BC powders per week  4. GERD  5. Acute blood loss anemia - 2.5 gram drop in hemoglobin overnight with three episodes of dark emesis  6. Cirrhosis - nodular contour seen on CT, LFTs within normal limits, no signs of decompensated liver disease at present   - Concern for UGI bleed from gastric ulcer versus gastritis. DDx also includes esophagitis, Dieulafoy's lesion, duodenitis, varices, Mallory-Weiss tear, etc -Advise EGD today for diagnostic and potential therapeutic purposes for prevention of recurrence. We discussed procedure details and indications in office today.  -Continue acid suppression -We discussed importance of stopping BC powders and the negative effects on gastric mucosa -NPO -Plan for EGD this afternoon with [name] -See procedure note for findings and further recommendations    I reviewed the risks (including bleeding, perforation, infection, anesthesia complications, cardiac/respiratory complications), benefits and alternatives of EGD. Patient consents to proceed.     Thank you for the consult. Please call with questions or concerns.  [name] [facility] [phone] (Cell)

## 2019-12-21 NOTE — Consult Note (Signed)
Pharmacy Antibiotic Note  [name] [name] is a 69 y.o. male admitted on 12/20/2019 with Intra-abdominal Infection.  Pharmacy has been consulted for Levofloxacin dosing. Ciprofloxacin @ ~1800 today.   PCN allergy, WBC 24.1>11.8, LA 2.1>0.8  Plan: Will change to Levofloxacin 750mg  q24h per improved renal function  Height: 5\' 4"  (162.6 cm) Weight: 165 lb (74.8 kg) IBW/kg (Calculated) : 59.2  Temp (24hrs), Avg:98.1 F (36.7 C), Min:97.6 F (36.4 C), Max:99.1 F (37.3 C)  Recent Labs  Lab 12/20/19 1603 12/20/19 1624 12/21/19 0435 12/21/19 0820  WBC 24.1*  --  11.8*  --   CREATININE 1.43*  --  0.88  --   LATICACIDVEN  --  2.1*  --  0.8    Estimated Creatinine Clearance: 76.4 mL/min (by C-G formula based on SCr of 0.88 mg/dL).    Allergies  Allergen Reactions  . Bee Venom Anaphylaxis  . Penicillins Anaphylaxis    Antimicrobials this admission: Cipro 2/27 x 1 Levofloxacin 2/28 >> Metronidazole 2/27 >>   Dose adjustments this admission: Levofloxacin q48>q24h  Microbiology results: 2/27 [id number] COVID NEG  Thank you for allowing pharmacy to be a part of this patient's care.  Lu Duffel, PharmD, BCPS Clinical Pharmacist 12/21/2019 10:32 AM

## 2019-12-21 NOTE — Op Note (Signed)
[facility]astroenter[facility] Patient Name: [name] Procedure Date: 12/21/2019 12:10 PM MRN: [id number] Account #: [id number] Date of Birth: 1951/04/06 Admit Type: Outpatient Age: 69 Room: [facility] ENDO ROOM 2 Gender: Male Note Status: Finalized Procedure:             Upper GI endoscopy Indications:           Hematemesis Providers:             Benay Pike. Hammond Obeirne MD, MD Medicines:             Propofol per Anesthesia Complications:         No immediate complications. Estimated blood loss: None. Procedure:             Pre-Anesthesia Assessment:                        - The risks and benefits of the procedure and the                         sedation options and risks were discussed with the                         patient. All questions were answered and informed                         consent was obtained.                        - Patient identification and proposed procedure were                         verified prior to the procedure by the nurse. The                         procedure was verified in the procedure room.                        - After reviewing the risks and benefits, the patient                         was deemed in satisfactory condition to undergo the                         procedure.                        - ASA Grade Assessment: III - A patient with severe                         systemic disease.                        After obtaining informed consent, the endoscope was                         passed under direct vision. Throughout the procedure,                         the patient's blood pressure, pulse, and oxygen  saturations were monitored continuously. The Endoscope                         was introduced through the mouth, and advanced to the                         third part of duodenum. The upper GI endoscopy was                         accomplished without difficulty. The patient tolerated        the procedure well. Findings:      LA Grade B (one or more mucosal breaks greater than 5 mm, not extending       between the tops of two mucosal folds) esophagitis with no bleeding was       found 25 to 32 cm from the incisors.      One non-bleeding superficial gastric ulcer with no stigmata of bleeding       was found in the gastric antrum. The lesion was 7 mm in largest       dimension.      Scattered moderate inflammation characterized by erosions, erythema and       friability was found in the gastric antrum. Biopsies were taken with a       cold forceps for Helicobacter pylori testing.      The cardia and gastric fundus were normal on retroflexion.      The examined duodenum was normal.      The exam was otherwise without abnormality. Impression:            - LA Grade B reflux esophagitis with no bleeding.                        - Non-bleeding gastric ulcer with no stigmata of                         bleeding.                        - Gastritis. Biopsied.                        - Normal examined duodenum.                        - The examination was otherwise normal. Recommendation:        - Return patient to hospital ward for possible                         discharge same day.                        - Cardiac diet.                        - Use Protonix (pantoprazole) 40 mg PO daily.                        - Await pathology results.                        - Return to my office in 3 weeks.                        -  The findings and recommendations were discussed with                         the patient. Procedure Code(s):     --- Professional ---                        [phone], Esophagogastroduodenoscopy, flexible,                         transoral; with biopsy, single or multiple Diagnosis Code(s):     --- Professional ---                        K92.0, Hematemesis                        K29.70, Gastritis, unspecified, without bleeding                        K25.9, Gastric  ulcer, unspecified as acute or chronic,                         without hemorrhage or perforation                        K21.00, Gastro-esophageal reflux disease with                         esophagitis, without bleeding CPT copyright 2019 American Medical Association. All rights reserved. The codes documented in this report are preliminary and upon coder review may  be revised to meet current compliance requirements. Efrain Sella MD, MD 12/21/2019 1:11:19 PM This report has been signed electronically. Number of Addenda: 0 Note Initiated On: 12/21/2019 12:10 PM Estimated Blood Loss:  Estimated blood loss: none.      [facility]

## 2019-12-21 NOTE — Interval H&P Note (Signed)
History and Physical Interval Note:  12/21/2019 12:34 PM  [name]  has presented today for surgery, with the diagnosis of Hematemesis, Nausea/vomiting, acute blood loss anemia, overuse of NSAIDs.  The various methods of treatment have been discussed with the patient and family. After consideration of risks, benefits and other options for treatment, the patient has consented to  Procedure(s): ESOPHAGOGASTRODUODENOSCOPY (EGD) (N/A) as a surgical intervention.  The patient's history has been reviewed, patient examined, no change in status, stable for surgery.  I have reviewed the patient's chart and labs.  Questions were answered to the patient's satisfaction.     [place], [place]

## 2019-12-21 NOTE — Transfer of Care (Signed)
Immediate Anesthesia Transfer of Care Note  [name]  Procedure(s) Performed: ESOPHAGOGASTRODUODENOSCOPY (EGD) (N/A )  Patient Location: PACU  Anesthesia Type:General  Level of Consciousness: awake and alert   Airway & Oxygen Therapy: Patient Spontanous Breathing  Post-op Assessment: Report given to RN  Post vital signs: Reviewed and stable  Last Vitals:  Vitals Value Taken Time  BP    Temp    Pulse    Resp    SpO2      Last Pain:  Vitals:   12/21/19 1152  TempSrc: Oral  PainSc:          Complications: No apparent anesthesia complications

## 2019-12-22 ENCOUNTER — Encounter: Payer: Self-pay | Admitting: *Deleted

## 2019-12-22 LAB — RENAL FUNCTION PANEL
Albumin: 3.5 g/dL (ref 3.5–5.0)
Anion gap: 8 (ref 5–15)
BUN: 18 mg/dL (ref 8–23)
CO2: 22 mmol/L (ref 22–32)
Calcium: 8 mg/dL — ABNORMAL LOW (ref 8.9–10.3)
Chloride: 104 mmol/L (ref 98–111)
Creatinine, Ser: 0.78 mg/dL (ref 0.61–1.24)
GFR calc Af Amer: >60 mL/min (ref >60)
GFR calc non Af Amer: >60 mL/min (ref >60)
Glucose, Bld: 222 mg/dL — ABNORMAL HIGH (ref 70–99)
Phosphorus: 2.3 mg/dL — ABNORMAL LOW (ref 2.5–4.6)
Potassium: 3.9 mmol/L (ref 3.5–5.1)
Sodium: 134 mmol/L — ABNORMAL LOW (ref 135–145)

## 2019-12-22 LAB — CBC
HCT: 35.6 % — ABNORMAL LOW (ref 39.0–52.0)
Hemoglobin: 12.6 g/dL — ABNORMAL LOW (ref 13.0–17.0)
MCH: 31.9 pg (ref 26.0–34.0)
MCHC: 35.4 g/dL (ref 30.0–36.0)
MCV: 90.1 fL (ref 80.0–100.0)
Platelets: 292 10*3/uL (ref 150–400)
RBC: 3.95 MIL/uL — ABNORMAL LOW (ref 4.22–5.81)
RDW: 12.2 % (ref 11.5–15.5)
WBC: 9.3 10*3/uL (ref 4.0–10.5)
nRBC: 0 % (ref 0.0–0.2)

## 2019-12-22 LAB — GLUCOSE, CAPILLARY
Glucose-Capillary: 243 mg/dL — ABNORMAL HIGH (ref 70–99)
Glucose-Capillary: 279 mg/dL — ABNORMAL HIGH (ref 70–99)
Glucose-Capillary: 222 mg/dL — ABNORMAL HIGH (ref 70–99)
Glucose-Capillary: 295 mg/dL — ABNORMAL HIGH (ref 70–99)

## 2019-12-22 LAB — PROCALCITONIN: Procalcitonin: 3.34 ng/mL

## 2019-12-22 MED ORDER — GABAPENTIN 600 MG PO TABS: 600.0000 mg | ORAL_TABLET | Freq: Two times a day (BID) | ORAL | Status: DC

## 2019-12-22 MED ORDER — GABAPENTIN 300 MG PO CAPS
600.0000 mg | ORAL_CAPSULE | Freq: Every morning | ORAL | Status: DC
  Administered 2019-12-22 – 2019-12-23 (×2): 600 mg via ORAL
  Filled 2019-12-22 (×2): qty 2

## 2019-12-22 MED ORDER — HYDROCHLOROTHIAZIDE 25 MG PO TABS
25.0000 mg | ORAL_TABLET | Freq: Every day | ORAL | Status: DC
  Administered 2019-12-22 – 2019-12-23 (×2): 25 mg via ORAL
  Filled 2019-12-22 (×2): qty 1

## 2019-12-22 MED ORDER — ASPIRIN EC 81 MG PO TBEC
81.0000 mg | DELAYED_RELEASE_TABLET | Freq: Every day | ORAL | Status: DC
  Administered 2019-12-22 – 2019-12-23 (×2): 81 mg via ORAL
  Filled 2019-12-22 (×2): qty 1

## 2019-12-22 MED ORDER — LOSARTAN POTASSIUM 50 MG PO TABS
100.0000 mg | ORAL_TABLET | Freq: Every day | ORAL | Status: DC
  Administered 2019-12-22 – 2019-12-23 (×2): 100 mg via ORAL
  Filled 2019-12-22 (×2): qty 2

## 2019-12-22 MED ORDER — SODIUM PHOSPHATES 45 MMOLE/15ML IV SOLN
30.0000 mmol | Freq: Once | INTRAVENOUS | Status: DC
  Filled 2019-12-22: qty 10

## 2019-12-22 MED ORDER — INSULIN GLARGINE 100 UNIT/ML ~~LOC~~ SOLN
10.0000 [IU] | Freq: Every day | SUBCUTANEOUS | Status: DC
  Administered 2019-12-22: 10 [IU] via SUBCUTANEOUS
  Filled 2019-12-22 (×2): qty 0.1

## 2019-12-22 MED ORDER — LOSARTAN POTASSIUM-HCTZ 100-25 MG PO TABS: 1.0000 | ORAL_TABLET | Freq: Every day | ORAL | Status: DC

## 2019-12-22 MED ORDER — SODIUM PHOSPHATES 45 MMOLE/15ML IV SOLN
30.0000 mmol | Freq: Once | INTRAVENOUS | Status: AC
  Administered 2019-12-22: 30 mmol via INTRAVENOUS
  Filled 2019-12-22: qty 10

## 2019-12-22 MED ORDER — ROSUVASTATIN CALCIUM 10 MG PO TABS
10.0000 mg | ORAL_TABLET | Freq: Every day | ORAL | Status: DC
  Administered 2019-12-22: 10 mg via ORAL
  Filled 2019-12-22: qty 1

## 2019-12-22 MED ORDER — GABAPENTIN 400 MG PO CAPS
1200.0000 mg | ORAL_CAPSULE | Freq: Every day | ORAL | Status: DC
  Administered 2019-12-22: 1200 mg via ORAL
  Filled 2019-12-22: qty 3

## 2019-12-22 NOTE — Progress Notes (Addendum)
Inpatient Diabetes Program Recommendations  AACE/ADA: New Consensus Statement on Inpatient Glycemic Control   Target Ranges:  Prepandial:   less than 140 mg/dL      Peak postprandial:   less than 180 mg/dL (1-2 hours)      Critically ill patients:  140 - 180 mg/dL   Results for [name], [name] (MRN [id number]) as of 12/22/2019 10:50  Ref. Range 12/21/2019 07:42 12/21/2019 11:49 12/21/2019 17:12 12/21/2019 20:00 12/22/2019 08:02  Glucose-Capillary Latest Ref Range: 70 - 99 mg/dL 180 (H) 167 (H) 298 (H) 273 (H) 243 (H)  Results for [name], [name] (MRN [id number]) as of 12/22/2019 10:50  Ref. Range 12/20/2019 22:05  Hemoglobin A1C Latest Ref Range: 4.8 - 5.6 % 11.0 (H)   Review of Glycemic Control  Diabetes history: DM2 Outpatient Diabetes medications: Levemir 48 units QAM, Levemir 20 units QHS, Metformin 1000 mg BID Current orders for Inpatient glycemic control: Novolog 0-15 units TID with meals and at bedtime  Inpatient Diabetes Program Recommendations:   Insulin-Basal: Please consider ordering Levemir 8 units Q24H.  HbgA1C:  A1C 11% on 12/20/19 indicating an average glucose of 269 mg/dl over the past 2-3 months.  Addendum 12/22/19@13 :45-Spoke with patient about diabetes and home regimen for diabetes control. Patient reports being followed by PCP for diabetes management. Patient notes that he has recently changed insurance and providers (wife was covering with her insurance and she retired so he lost coverage he had through her employer). He also notes that when he lost coverage under her his insulin was $1700 which he could not afford. Patient states that he now has Medicare and his copay for insulin is $57 which is affordable. Patient reports that he has everything needed for DM management.  Patient reports that no changes were made with DM medications at his last office visit with PCP. Patient reports checking glucose 2-3 times per day and that it is usually always in the 200's mg/dl and he notes  that it starts out in low 200's in the morning and increases throughout the day.   Discussed A1C results (11% on 12/20/19) and explained that current A1C indicates an average glucose of 269 mg/dl over the past 2-3 months. Discussed glucose and A1C goals. Discussed importance of checking CBGs and maintaining good CBG control to prevent long-term and short-term complications. Explained how hyperglycemia leads to damage within blood vessels which lead to the common complications seen with uncontrolled diabetes. Stressed to the patient the importance of improving glycemic control to prevent further complications from uncontrolled diabetes. Discussed impact of nutrition, exercise, stress, sickness, and medications on diabetes control. Encouraged patient to check glucose 4 times per day (before meals and at bedtime) and to keep a log book of glucose readings. Patient reports that he already keeps a log of glucose readings and takes it to his doctor appointments.  Encouraged patient to talk with PCP about improving DM control and making adjustments with DM medications if needed.   Patient verbalized understanding of information discussed and reports no further questions at this time related to diabetes.  Thanks, Barnie Alderman, RN, MSN, CDE Diabetes Coordinator Inpatient Diabetes Program [phone] (Team Pager from 8am to 5pm)

## 2019-12-22 NOTE — Progress Notes (Addendum)
PROGRESS NOTE    [name]  FFM:[id number] DOB: 03/24/1951 DOA: 12/20/2019 PCP: Patient, No Pcp Per   Brief Narrative:  [name] is a 69 y.o. male with medical history significant for insulin-dependent type 2 diabetes, hypertension, history of CVA as well as history of small bowel obstruction treated conservatively who presents to the emergency room with a complaint of generalized weakness and vomiting.  CT abdomen and pelvis showed antral wall thickening, gastritis or ulcer with recommendation for consideration of EGD.  It also showed a nodular liver. He was hypotensive on presentation with BP of 76/49 which improved to 112/71 by admission with IV fluid.  Started on Levaquin and Flagyl for possible intra-abdominal infection.  Subjective: Patient was complaining of nausea and had 2 vomitus since this morning.  Denies any abdominal pain.  Remain afebrile.  Assessment & Plan:   Principal Problem:   Sepsis ([name]) Active Problems:   Gastritis   Hyponatremia   AKI (acute kidney injury) ([name])   Essential hypertension   History of CVA (cerebrovascular accident)   Hyperglycemia due to type 2 diabetes mellitus ([place])   Enteritis  Sepsis ([name]) suspect GI source/gastric ulcer/GI bleed.  CT with possible gastritis/gastric ulcer.  No obvious bleeding, questionable coffee-ground emesis. Mild decrease in hemoglobin, can be dilutional as patient received IV fluid.   Prior history of SBO treated conservatively. Initially met sepsis criteria being hypotensive, leukocytosis, elevated PCT and lactic acid.  Blood pressure improved with IV fluid.  Leukocytosis improving. Lactic acidosis resolved.  Remained afebrile.  Blood culture negative so far.  PCT trending down. 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 -They was consulted-underwent EGD yesterday which shows  esophagitis and gastritis with nonbleeding ulcers, biopsies were taken for H. Pylori. -Continue IV Flagyl and Levaquin. -Continue IV Protonix.  Nausea and vomiting.  Patient again developed some nausea and vomiting. -Continue with Zofran. -Continue to monitor-if remains stable then can be discharged tomorrow.  Hypophosphatemia.  Phosphorus at 2.3 today. -Replete phosphorus and monitor.  Tingling of left forearm.  No concern today.  Lasted for couple of minutes per patient.  Spontaneously resolved.  No focal neurologic deficit. -BMP this morning with calcium of 7.7, it was 8.7 yesterday-we will check albumin for corrected calcium.  No other electrolyte abnormalities. -Continue to monitor.  Mild hyponatremia.  Resolved.  Initially presented with corrected sodium of 133 most likely secondary to hypovolemia and GI losses. -Continue to monitor.  AKI.  Resolved.  Most likely secondary to dehydration. -Continue to monitor  Hyperglycemia due to type 2 diabetes mellitus ([name]).  Uncontrolled with A1c of 11.  CBG elevated today. -Continue every 4 hours sliding scale. -Add Lantus 10 units at bedtime.  Essential hypertension.  Pressure elevated now -Start home dose of losartan and HCTZ. -Can restart home dose of verapamil if needed.  History of CVA (cerebrovascular accident) -Restart home dose of aspirin and statin.  Objective: Vitals:   12/21/19 1505 12/21/19 2001 12/22/19 0447 12/22/19 1148  BP: 128/78 (!) 136/92 (!) 151/83 (!) 129/96  Pulse: 85 87 85 83  Resp: (!) '24 18 20   ' Temp: 98.4 F (36.9 C) 98.1 F (36.7 C) 98.6 F (37 C)   TempSrc: Oral Oral Oral   SpO2: 99% 97% 94% 97%  Weight:      Height:        Intake/Output Summary (Last 24 hours)  at 12/22/2019 1308 Last data filed at 12/22/2019 0925 Gross per 24 hour  Intake 1835.59 ml  Output 1025 ml  Net 810.59 ml   Filed Weights   12/20/19 1957  Weight: 74.8 kg    Examination:  General exam: Well-developed gentleman,  appears calm and comfortable  Respiratory system: Clear to auscultation. Respiratory effort normal. Cardiovascular system: S1 & S2 heard, RRR. No JVD, murmurs, rubs, gallops or clicks. Gastrointestinal system: Soft, nontender, nondistended, bowel sounds positive. Central nervous system: Alert and oriented. No focal neurological deficits.Symmetric 5 x 5 power. Extremities: No edema, no cyanosis, pulses intact and symmetrical. Skin: 3 small healing lacerations on left forearm with mild surrounding erythema, no sign of infection. Psychiatry: Judgement and insight appear normal. Mood & affect appropriate.    DVT prophylaxis: SCDs Code Status: Full Family Communication: No family at bedside. Disposition Plan: Pending improvement, will go back home.  Consultants:   GI  Procedures:   Antimicrobials:  Levaquin Flagyl  Data Reviewed: I have personally reviewed following labs and imaging studies  CBC: Recent Labs  Lab 12/20/19 1603 12/20/19 2205 12/21/19 0435 12/22/19 0407  WBC 24.1*  --  11.8* 9.3  NEUTROABS 20.6*  --   --   --   HGB 14.3 12.2* 11.8* 12.6*  HCT 40.7  --  34.5* 35.6*  MCV 89.3  --  91.3 90.1  PLT 354  --  273 253   Basic Metabolic Panel: Recent Labs  Lab 12/20/19 1603 12/21/19 0435 12/22/19 0407  NA 130* 135 134*  K 4.8 3.5 3.9  CL 96* 105 104  CO2 '23 22 22  ' GLUCOSE 279* 118* 222*  BUN 32* 21 18  CREATININE 1.43* 0.88 0.78  CALCIUM 8.7* 7.7* 8.0*  PHOS  --   --  2.3*   GFR: Estimated Creatinine Clearance: 84 mL/min (by C-G formula based on SCr of 0.78 mg/dL). Liver Function Tests: Recent Labs  Lab 12/20/19 1603 12/21/19 0435 12/22/19 0407  AST 34  --   --   ALT 35  --   --   ALKPHOS 93  --   --   BILITOT 1.4*  --   --   PROT 8.2*  --   --   ALBUMIN 4.3 3.2* 3.5   Recent Labs  Lab 12/20/19 2205  LIPASE 20   No results for input(s): AMMONIA in the last 168 hours. Coagulation Profile: Recent Labs  Lab 12/20/19 1603 12/21/19 0435    INR 1.2 1.3*   Cardiac Enzymes: No results for input(s): CKTOTAL, CKMB, CKMBINDEX, TROPONINI in the last 168 hours. BNP (last 3 results) No results for input(s): PROBNP in the last 8760 hours. HbA1C: Recent Labs    12/20/19 2205  HGBA1C 11.0*   CBG: Recent Labs  Lab 12/21/19 1149 12/21/19 1712 12/21/19 2000 12/22/19 0802 12/22/19 1147  GLUCAP 167* 298* 273* 243* 279*   Lipid Profile: No results for input(s): CHOL, HDL, LDLCALC, TRIG, CHOLHDL, LDLDIRECT in the last 72 hours. Thyroid Function Tests: No results for input(s): TSH, T4TOTAL, FREET4, T3FREE, THYROIDAB in the last 72 hours. Anemia Panel: No results for input(s): VITAMINB12, FOLATE, FERRITIN, TIBC, IRON, RETICCTPCT in the last 72 hours. Sepsis Labs: Recent Labs  Lab 12/20/19 1624 12/21/19 0435 12/21/19 0820 12/22/19 0407  PROCALCITON  --  7.19  --  3.34  LATICACIDVEN 2.1*  --  0.8  --     Recent Results (from the past 240 hour(s))  Blood culture (routine x 2)     Status:  None (Preliminary result)   Collection Time: 12/20/19  4:24 PM   Specimen: BLOOD  Result Value Ref Range Status   Specimen Description BLOOD LEFT ANTECUBITAL  Final   Special Requests   Final    BOTTLES DRAWN AEROBIC AND ANAEROBIC Blood Culture results may not be optimal due to an inadequate volume of blood received in culture bottles   Culture   Final    NO GROWTH 2 DAYS Performed at [facility], [address] [place], [place] [address]    Report Status PENDING  Incomplete  Blood culture (routine x 2)     Status: None (Preliminary result)   Collection Time: 12/20/19  5:27 PM   Specimen: BLOOD  Result Value Ref Range Status   Specimen Description BLOOD LT [facility]  Final   Special Requests   Final    BOTTLES DRAWN AEROBIC AND ANAEROBIC Blood Culture results may not be optimal due to an excessive volume of blood received in culture bottles   Culture   Final    NO GROWTH 2 DAYS Performed at [facility], [address], [place], [place] [address]    Report Status PENDING  Incomplete  SARS CORONAVIRUS 2 (TAT 6-24 HRS) Nasopharyngeal     Status: None   Collection Time: 12/20/19  9:22 PM   Specimen: Nasopharyngeal  Result Value Ref Range Status   SARS Coronavirus 2 NEGATIVE NEGATIVE Final    Comment: (NOTE) SARS-CoV-2 target nucleic acids are NOT DETECTED. The SARS-CoV-2 RNA is generally detectable in upper and lower respiratory specimens during the acute phase of infection. Negative results do not preclude SARS-CoV-2 infection, do not rule out co-infections with other pathogens, and should not be used as the sole basis for treatment or other patient management decisions. Negative results must be combined with clinical observations, patient history, and epidemiological information. The expected result is Negative. Fact Sheet for Patients: SugarRoll.be Fact Sheet for Healthcare Providers: https://www.woods-mathews.com/ This test is not yet approved or cleared by the [place] FDA and  has been authorized for detection and/or diagnosis of SARS-CoV-2 by FDA under an Emergency Use Authorization (EUA). This EUA will remain  in effect (meaning this test can be used) for the duration of the COVID-19 declaration under Section 56 4(b)(1) of the Act, 21 U.S.C. section 360bbb-3(b)(1), unless the authorization is terminated or revoked sooner. Performed at [facility], [place] [address] [place], [place] [address]      Radiology Studies: CT HEAD WO CONTRAST  Result Date: 12/20/2019 CLINICAL DATA:  Focal neuro deficit. Stroke suspected EXAM: CT HEAD WITHOUT CONTRAST TECHNIQUE: Contiguous axial images were obtained from the base of the skull through the vertex without intravenous contrast. COMPARISON:  06/10/2013 FINDINGS: Brain: No evidence of acute infarction, hemorrhage, hydrocephalus, extra-axial collection or mass lesion/mass effect. Mild atrophy  and some chronic microvascular ischemic changes. Vascular: No hyperdense vessel or unexpected calcification. Skull: Normal. Negative for fracture or focal lesion. Sinuses/Orbits: Visualized paranasal sinuses and orbits are unremarkable. Other: None. IMPRESSION: No acute intracranial findings. Signs of prior infarcts in the right basal ganglia and evidence of atrophy. Electronically Signed   By: Zetta Bills M.D.   On: 12/20/2019 17:11   CT ABDOMEN PELVIS W CONTRAST  Result Date: 12/20/2019 CLINICAL DATA:  Abdominal distension and hypotension. EXAM: CT ABDOMEN AND PELVIS WITH CONTRAST TECHNIQUE: Multidetector CT imaging of the abdomen and pelvis was performed using the standard protocol following bolus administration of intravenous contrast. CONTRAST:  173m OMNIPAQUE IOHEXOL 300  MG/ML  SOLN COMPARISON:  06/07/2013 FINDINGS: Lower chest: Signs of basilar atelectasis. No signs of consolidation or pleural effusion. Hepatobiliary: No signs of focal, suspicious hepatic lesion. Lobular hepatic contours and nodular liver surface. Post cholecystectomy without ductal dilation. Portal vein is patent. Pancreas: Pancreas is normal without focal inflammation or ductal dilation. Spleen: Spleen is normal size without focal lesion. Adrenals/Urinary Tract: Adrenal glands are normal. No signs of hydronephrosis or suspicious renal lesion. Urinary bladder is normal. Stomach/Bowel: Signs of gastric antral thickening and irregularity. Best seen on image 28 of series 2. No signs of bowel obstruction. Appendix is normal, in the right lower quadrant. Colon is stool filled throughout much of its course. Vascular/Lymphatic: Vascular structures in the abdomen are patent. No signs of adenopathy but with numerous small lymph nodes in the retroperitoneum and upper abdomen. No signs of pelvic adenopathy. Reproductive: Prostate mildly enlarged. Heterogeneous, nonspecific finding on CT. Other: Small fat containing inguinal hernias. No free  air. No ascites. Musculoskeletal: No acute bone process. IMPRESSION: 1. Signs of gastric antral wall thickening and irregularity. Findings are concerning for gastritis or ulcer disease, consider endoscopic correlation on follow-up. 2. Lobular hepatic contours and nodular liver surface suggests underlying liver disease. Correlate with clinical or laboratory evidence of liver dysfunction. 3. Post cholecystectomy without ductal dilation. 4. Small fat containing inguinal hernias. 5. Mildly enlarged heterogeneous prostate gland. Correlate with physical examination and PSA. 6. Constipation. Electronically Signed   By: Zetta Bills M.D.   On: 12/20/2019 17:24   DG Chest Port 1 View  Result Date: 12/20/2019 CLINICAL DATA:  69 year old male with altered mental status. EXAM: PORTABLE CHEST 1 VIEW COMPARISON:  Chest radiograph dated 03/26/2019. FINDINGS: Mild chronic interstitial coarsening and bronchitic changes. No focal consolidation, pleural effusion, or pneumothorax. Top-normal cardiac silhouette. No acute osseous pathology. Biapical subpleural plaques noted. IMPRESSION: No acute cardiopulmonary process. Electronically Signed   By: Anner Crete M.D.   On: 12/20/2019 16:47   DG Abd Portable 1 View  Result Date: 12/20/2019 CLINICAL DATA:  69 year old male with abdominal pain. EXAM: PORTABLE ABDOMEN - 1 VIEW COMPARISON:  CT abdomen pelvis dated 06/07/2013. FINDINGS: There is moderate stool throughout the colon. No bowel dilatation or evidence of obstruction. No free air or radiopaque calculi. Right upper quadrant cholecystectomy clips. There is mild osteopenia and degenerative changes of the spine. No acute osseous pathology. IMPRESSION: No acute findings. Electronically Signed   By: Anner Crete M.D.   On: 12/20/2019 16:49    Scheduled Meds: . insulin aspart  0-15 Units Subcutaneous TID AC & HS  . pantoprazole  40 mg Oral Daily   Continuous Infusions: . sodium chloride 100 mL/hr at 12/22/19 1003   . levofloxacin (LEVAQUIN) IV Stopped (12/22/19 0759)  . metronidazole 500 mg (12/22/19 1003)  . sodium phosphate  Dextrose 5% IVPB 30 mmol (12/22/19 1004)     LOS: 2 days   Time spent: 35 minutes  Lorella Nimrod, MD [facility]  If 7PM-7AM, please contact night-coverage [email or web address]  12/22/2019, 1:08 PM   This record has been created using Systems analyst. Errors have been sought and corrected,but may not always be located. Such creation errors do not reflect on the standard of care.

## 2019-12-23 DIAGNOSIS — E1165 Type 2 diabetes mellitus with hyperglycemia: Secondary | ICD-10-CM

## 2019-12-23 DIAGNOSIS — Z8673 Personal history of transient ischemic attack (TIA), and cerebral infarction without residual deficits: Secondary | ICD-10-CM

## 2019-12-23 LAB — BASIC METABOLIC PANEL
Anion gap: 6 (ref 5–15)
BUN: 15 mg/dL (ref 8–23)
CO2: 26 mmol/L (ref 22–32)
Calcium: 8.1 mg/dL — ABNORMAL LOW (ref 8.9–10.3)
Chloride: 106 mmol/L (ref 98–111)
Creatinine, Ser: 0.82 mg/dL (ref 0.61–1.24)
GFR calc Af Amer: >60 mL/min (ref >60)
GFR calc non Af Amer: >60 mL/min (ref >60)
Glucose, Bld: 190 mg/dL — ABNORMAL HIGH (ref 70–99)
Potassium: 3.9 mmol/L (ref 3.5–5.1)
Sodium: 138 mmol/L (ref 135–145)

## 2019-12-23 LAB — CBC
HCT: 36.6 % — ABNORMAL LOW (ref 39.0–52.0)
Hemoglobin: 12.8 g/dL — ABNORMAL LOW (ref 13.0–17.0)
MCH: 31.7 pg (ref 26.0–34.0)
MCHC: 35 g/dL (ref 30.0–36.0)
MCV: 90.6 fL (ref 80.0–100.0)
Platelets: 302 10*3/uL (ref 150–400)
RBC: 4.04 MIL/uL — ABNORMAL LOW (ref 4.22–5.81)
RDW: 12.3 % (ref 11.5–15.5)
WBC: 7.4 10*3/uL (ref 4.0–10.5)
nRBC: 0 % (ref 0.0–0.2)

## 2019-12-23 LAB — SURGICAL PATHOLOGY

## 2019-12-23 LAB — GLUCOSE, CAPILLARY: Glucose-Capillary: 217 mg/dL — ABNORMAL HIGH (ref 70–99)

## 2019-12-23 LAB — PROCALCITONIN: Procalcitonin: 1.89 ng/mL

## 2019-12-23 LAB — PHOSPHORUS: Phosphorus: 3.5 mg/dL (ref 2.5–4.6)

## 2019-12-23 MED ORDER — METRONIDAZOLE 500 MG PO TABS: 500.0000 mg | ORAL_TABLET | Freq: Two times a day (BID) | ORAL | 0 refills | Status: AC

## 2019-12-23 MED ORDER — LEVOFLOXACIN 750 MG PO TABS: 750.0000 mg | ORAL_TABLET | Freq: Every day | ORAL | 0 refills | Status: AC

## 2019-12-23 NOTE — Progress Notes (Signed)
Pt discharged per MD order. IV removed. Discharge instructions reviewed with pt. Pt verbalized understanding with all questions answered to pt satisfaction. Pt taken to car in wheelchair by staff.  

## 2019-12-23 NOTE — Care Management Important Message (Signed)
Important Message  Patient Details  Name: [name] MRN: [id number] Date of Birth: [birth year]   Medicare Important Message Given:  No  Patient discharged prior to arrival to unit to deliver concurrent Medicare IM.     Dannette Barbara 12/23/2019, 12:26 PM

## 2019-12-23 NOTE — Progress Notes (Signed)
Inpatient Diabetes Program Recommendations  AACE/ADA: New Consensus Statement on Inpatient Glycemic Control   Target Ranges:  Prepandial:   less than 140 mg/dL      Peak postprandial:   less than 180 mg/dL (1-2 hours)      Critically ill patients:  140 - 180 mg/dL   Results for JHAYCE, SICOTTE (MRN [id number]) as of 12/23/2019 11:40  Ref. Range 12/22/2019 08:02 12/22/2019 11:47 12/22/2019 16:09 12/22/2019 20:43 12/23/2019 07:53  Glucose-Capillary Latest Ref Range: 70 - 99 mg/dL 243 (H) 279 (H) 222 (H) 295 (H) 217 (H)   Review of Glycemic Control  Diabetes history: DM2 Outpatient Diabetes medications: Levemir 48 units QAM, Levemir 20 units QHS, Metformin 1000 mg BID Current orders for Inpatient glycemic control: Lantus 10 units QHS, Novolog 0-15 units TID with meals and at bedtime  Inpatient Diabetes Program Recommendations:   Insulin-Basal: Please consider increasing Lantus to 15 units QHS (based on 74.8 kg x 0.2 units).  Insulin-Meal Coverage: Please consider ordering Novolog 3 units TID with meals for meal coverage if patient eats at least 50% of meals.  Thanks, Barnie Alderman, RN, MSN, CDE Diabetes Coordinator Inpatient Diabetes Program [phone] (Team Pager from 8am to 5pm)

## 2019-12-23 NOTE — Discharge Summary (Signed)
Physician Discharge Summary  [name] [id number] DOB: [birth year] DOA: 12/20/2019  PCP: Patient, No Pcp Per  Admit date: 12/20/2019 Discharge date: 12/23/2019  Admitted From: Home Disposition: Home  Recommendations for Outpatient Follow-up:  1. Follow up with PCP in 1-2 weeks 2. Please obtain BMP/CBC in one week 3. Please follow up on the following pending results: None  Home Health: No Equipment/Devices: None Discharge Condition: Stable CODE STATUS: Full Diet recommendation: Heart Healthy / Carb Modified  Brief/Interim Summary: Duane Cap JRis a 69 y.o.malewith medical history significant forinsulin-dependent type 2 diabetes, hypertension, history of CVA as well as history of small bowel obstruction treated conservatively who presents to the emergency room with a complaint of generalized weakness and vomiting. CT abdomen and pelvis showed antral wall thickening, gastritis or ulcer with recommendation for consideration of EGD. It also showed a nodular liver. He was hypotensive on presentation with BP of 76/49 which improved to 112/71 by admission with IV fluid.  Started on Levaquin and Flagyl for possible intra-abdominal infection.  Blood culture remain negative. 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  He responded well to Levaquin and Flagyl.  His symptoms improved. Discharged home on p.o. Flagyl and Levaquin for 3 more days to complete antibiotics.  He will follow-up with his PCP for further management.  Patient also has some mild electrolyte abnormalities including mild hyponatremia and hypophosphatemia which resolved with repletion.  Also had AKI on presentation most likely secondary to dehydration which resolved with IV fluid.  He has uncontrolled diabetes with A1c of 11.  It was managed with Lantus and NovoLog while in the hospital  and he will resume his home medications on discharge.  He will continue rest of his home meds.  Discharge Diagnoses:  Principal Problem:   Sepsis ([place]) Active Problems:   Gastritis   Hyponatremia   AKI (acute kidney injury) ([place])   Essential hypertension   History of CVA (cerebrovascular accident)   Hyperglycemia due to type 2 diabetes mellitus ([place])   Enteritis  Discharge Instructions  Discharge Instructions    Diet - low sodium heart healthy   Complete by: As directed    Discharge instructions   Complete by: As directed    It was pleasure taking care of you. Please complete the course of antibiotics as directed and follow-up with your primary care physician.   Increase activity slowly   Complete by: As directed      Allergies as of 12/23/2019      Reactions   Bee Venom Anaphylaxis   Penicillins Anaphylaxis      Medication List    STOP taking these medications   glimepiride 1 MG tablet Commonly known as: AMARYL     TAKE these medications   aspirin EC 81 MG tablet Take 81 mg by mouth daily. For pain   BD Pen Needle Micro U/F 32G X 6 MM Misc Generic drug: Insulin Pen Needle Use pen needles for insulin injection each evening   gabapentin 600 MG tablet Commonly known as: NEURONTIN Take 600-1,200 mg by mouth 2 (two) times daily. Take 1 tablet by mouth in the morning and 2 tablets in the evening   Levemir FlexTouch 100 UNIT/ML Pen Generic drug: Insulin Detemir 48 UNITS IN THE MORNING AND 20 MINUTES AT NIGHT   levofloxacin 750 MG tablet Commonly known as: Levaquin Take 1 tablet (750 mg  total) by mouth daily for 3 days.   losartan-hydrochlorothiazide 100-25 MG tablet Commonly known as: HYZAAR Take 1 tablet by mouth daily.   meloxicam 15 MG tablet Commonly known as: MOBIC Take 15 mg by mouth daily.   metFORMIN 1000 MG tablet Commonly known as: GLUCOPHAGE Take 1,000 mg by mouth 2 (two) times daily.   metroNIDAZOLE 500 MG tablet Commonly known as:  Flagyl Take 1 tablet (500 mg total) by mouth 2 (two) times daily for 3 days.   rosuvastatin 10 MG tablet Commonly known as: CRESTOR Take 10 mg by mouth at bedtime.   sildenafil 20 MG tablet Commonly known as: REVATIO TAKE 1 TABLET BY MOUTH prior TO sexual activity prn   verapamil 240 MG 24 hr capsule Commonly known as: VERELAN PM Take 240 mg by mouth daily.       Allergies  Allergen Reactions  . Bee Venom Anaphylaxis  . Penicillins Anaphylaxis    Consultations:  None  Procedures/Studies: CT HEAD WO CONTRAST  Result Date: 12/20/2019 CLINICAL DATA:  Focal neuro deficit. Stroke suspected EXAM: CT HEAD WITHOUT CONTRAST TECHNIQUE: Contiguous axial images were obtained from the base of the skull through the vertex without intravenous contrast. COMPARISON:  06/10/2013 FINDINGS: Brain: No evidence of acute infarction, hemorrhage, hydrocephalus, extra-axial collection or mass lesion/mass effect. Mild atrophy and some chronic microvascular ischemic changes. Vascular: No hyperdense vessel or unexpected calcification. Skull: Normal. Negative for fracture or focal lesion. Sinuses/Orbits: Visualized paranasal sinuses and orbits are unremarkable. Other: None. IMPRESSION: No acute intracranial findings. Signs of prior infarcts in the right basal ganglia and evidence of atrophy. Electronically Signed   By: Zetta Bills M.D.   On: 12/20/2019 17:11   CT ABDOMEN PELVIS W CONTRAST  Result Date: 12/20/2019 CLINICAL DATA:  Abdominal distension and hypotension. EXAM: CT ABDOMEN AND PELVIS WITH CONTRAST TECHNIQUE: Multidetector CT imaging of the abdomen and pelvis was performed using the standard protocol following bolus administration of intravenous contrast. CONTRAST:  120mL OMNIPAQUE IOHEXOL 300 MG/ML  SOLN COMPARISON:  06/07/2013 FINDINGS: Lower chest: Signs of basilar atelectasis. No signs of consolidation or pleural effusion. Hepatobiliary: No signs of focal, suspicious hepatic lesion. Lobular  hepatic contours and nodular liver surface. Post cholecystectomy without ductal dilation. Portal vein is patent. Pancreas: Pancreas is normal without focal inflammation or ductal dilation. Spleen: Spleen is normal size without focal lesion. Adrenals/Urinary Tract: Adrenal glands are normal. No signs of hydronephrosis or suspicious renal lesion. Urinary bladder is normal. Stomach/Bowel: Signs of gastric antral thickening and irregularity. Best seen on image 28 of series 2. No signs of bowel obstruction. Appendix is normal, in the right lower quadrant. Colon is stool filled throughout much of its course. Vascular/Lymphatic: Vascular structures in the abdomen are patent. No signs of adenopathy but with numerous small lymph nodes in the retroperitoneum and upper abdomen. No signs of pelvic adenopathy. Reproductive: Prostate mildly enlarged. Heterogeneous, nonspecific finding on CT. Other: Small fat containing inguinal hernias. No free air. No ascites. Musculoskeletal: No acute bone process. IMPRESSION: 1. Signs of gastric antral wall thickening and irregularity. Findings are concerning for gastritis or ulcer disease, consider endoscopic correlation on follow-up. 2. Lobular hepatic contours and nodular liver surface suggests underlying liver disease. Correlate with clinical or laboratory evidence of liver dysfunction. 3. Post cholecystectomy without ductal dilation. 4. Small fat containing inguinal hernias. 5. Mildly enlarged heterogeneous prostate gland. Correlate with physical examination and PSA. 6. Constipation. Electronically Signed   By: Zetta Bills M.D.   On: 12/20/2019 17:24  DG Chest Port 1 View  Result Date: 12/20/2019 CLINICAL DATA:  69 year old male with altered mental status. EXAM: PORTABLE CHEST 1 VIEW COMPARISON:  Chest radiograph dated 03/26/2019. FINDINGS: Mild chronic interstitial coarsening and bronchitic changes. No focal consolidation, pleural effusion, or pneumothorax. Top-normal cardiac  silhouette. No acute osseous pathology. Biapical subpleural plaques noted. IMPRESSION: No acute cardiopulmonary process. Electronically Signed   By: Anner Crete M.D.   On: 12/20/2019 16:47   DG Abd Portable 1 View  Result Date: 12/20/2019 CLINICAL DATA:  69 year old male with abdominal pain. EXAM: PORTABLE ABDOMEN - 1 VIEW COMPARISON:  CT abdomen pelvis dated 06/07/2013. FINDINGS: There is moderate stool throughout the colon. No bowel dilatation or evidence of obstruction. No free air or radiopaque calculi. Right upper quadrant cholecystectomy clips. There is mild osteopenia and degenerative changes of the spine. No acute osseous pathology. IMPRESSION: No acute findings. Electronically Signed   By: Anner Crete M.D.   On: 12/20/2019 16:49    Subjective: Patient was feeling better when seen during morning rounds.  No new complaints.  Discharge Exam: Vitals:   12/22/19 2046 12/23/19 0423  BP: 139/90 109/81  Pulse: 79 72  Resp: 20 20  Temp: 98.2 F (36.8 C) 98.3 F (36.8 C)  SpO2: 96% 96%   Vitals:   12/22/19 0447 12/22/19 1148 12/22/19 2046 12/23/19 0423  BP: (!) 151/83 (!) 129/96 139/90 109/81  Pulse: 85 83 79 72  Resp: 20  20 20   Temp: 98.6 F (37 C)  98.2 F (36.8 C) 98.3 F (36.8 C)  TempSrc: Oral  Oral   SpO2: 94% 97% 96% 96%  Weight:      Height:        General: Pt is alert, awake, not in acute distress Cardiovascular: RRR, S1/S2 +, no rubs, no gallops Respiratory: CTA bilaterally, no wheezing, no rhonchi Abdominal: Soft, NT, ND, bowel sounds + Extremities: no edema, no cyanosis   The results of significant diagnostics from this hospitalization (including imaging, microbiology, ancillary and laboratory) are listed below for reference.    Microbiology: Recent Results (from the past 240 hour(s))  Blood culture (routine x 2)     Status: None (Preliminary result)   Collection Time: 12/20/19  4:24 PM   Specimen: BLOOD  Result Value Ref Range Status    Specimen Description BLOOD LEFT ANTECUBITAL  Final   Special Requests   Final    BOTTLES DRAWN AEROBIC AND ANAEROBIC Blood Culture results may not be optimal due to an inadequate volume of blood received in culture bottles   Culture   Final    NO GROWTH 3 DAYS Performed at [facility], [address] [place], [place] [address]    Report Status PENDING  Incomplete  Blood culture (routine x 2)     Status: None (Preliminary result)   Collection Time: 12/20/19  5:27 PM   Specimen: BLOOD  Result Value Ref Range Status   Specimen Description BLOOD LT [facility]  Final   Special Requests   Final    BOTTLES DRAWN AEROBIC AND ANAEROBIC Blood Culture results may not be optimal due to an excessive volume of blood received in culture bottles   Culture   Final    NO GROWTH 3 DAYS Performed at [facility], [address], [place], [place] [address]    Report Status PENDING  Incomplete  SARS CORONAVIRUS 2 (TAT 6-24 HRS) Nasopharyngeal     Status: None   Collection Time: 12/20/19  9:22 PM  Specimen: Nasopharyngeal  Result Value Ref Range Status   SARS Coronavirus 2 NEGATIVE NEGATIVE Final    Comment: (NOTE) SARS-CoV-2 target nucleic acids are NOT DETECTED. The SARS-CoV-2 RNA is generally detectable in upper and lower respiratory specimens during the acute phase of infection. Negative results do not preclude SARS-CoV-2 infection, do not rule out co-infections with other pathogens, and should not be used as the sole basis for treatment or other patient management decisions. Negative results must be combined with clinical observations, patient history, and epidemiological information. The expected result is Negative. Fact Sheet for Patients: SugarRoll.be Fact Sheet for Healthcare Providers: https://www.woods-mathews.com/ This test is not yet approved or cleared by the [place] FDA and  has been authorized for detection and/or diagnosis  of SARS-CoV-2 by FDA under an Emergency Use Authorization (EUA). This EUA will remain  in effect (meaning this test can be used) for the duration of the COVID-19 declaration under Section 56 4(b)(1) of the Act, 21 U.S.C. section 360bbb-3(b)(1), unless the authorization is terminated or revoked sooner. Performed at [facility], [place] [address] [place], [place] [address]      Labs: BNP (last 3 results) No results for input(s): BNP in the last 8760 hours. Basic Metabolic Panel: Recent Labs  Lab 12/20/19 1603 12/21/19 0435 12/22/19 0407 12/23/19 0534  NA 130* 135 134* 138  K 4.8 3.5 3.9 3.9  CL 96* 105 104 106  CO2 23 22 22 26   GLUCOSE 279* 118* 222* 190*  BUN 32* 21 18 15   CREATININE 1.43* 0.88 0.78 0.82  CALCIUM 8.7* 7.7* 8.0* 8.1*  PHOS  --   --  2.3* 3.5   Liver Function Tests: Recent Labs  Lab 12/20/19 1603 12/21/19 0435 12/22/19 0407  AST 34  --   --   ALT 35  --   --   ALKPHOS 93  --   --   BILITOT 1.4*  --   --   PROT 8.2*  --   --   ALBUMIN 4.3 3.2* 3.5   Recent Labs  Lab 12/20/19 2205  LIPASE 20   No results for input(s): AMMONIA in the last 168 hours. CBC: Recent Labs  Lab 12/20/19 1603 12/20/19 2205 12/21/19 0435 12/22/19 0407 12/23/19 0534  WBC 24.1*  --  11.8* 9.3 7.4  NEUTROABS 20.6*  --   --   --   --   HGB 14.3 12.2* 11.8* 12.6* 12.8*  HCT 40.7  --  34.5* 35.6* 36.6*  MCV 89.3  --  91.3 90.1 90.6  PLT 354  --  273 292 302   Cardiac Enzymes: No results for input(s): CKTOTAL, CKMB, CKMBINDEX, TROPONINI in the last 168 hours. BNP: Invalid input(s): POCBNP CBG: Recent Labs  Lab 12/22/19 0802 12/22/19 1147 12/22/19 1609 12/22/19 2043 12/23/19 0753  GLUCAP 243* 279* 222* 295* 217*   D-Dimer No results for input(s): DDIMER in the last 72 hours. Hgb A1c Recent Labs    12/20/19 2205  HGBA1C 11.0*   Lipid Profile No results for input(s): CHOL, HDL, LDLCALC, TRIG, CHOLHDL, LDLDIRECT in the last 72 hours. Thyroid  function studies No results for input(s): TSH, T4TOTAL, T3FREE, THYROIDAB in the last 72 hours.  Invalid input(s): FREET3 Anemia work up No results for input(s): VITAMINB12, FOLATE, FERRITIN, TIBC, IRON, RETICCTPCT in the last 72 hours. Urinalysis    Component Value Date/Time   COLORURINE YELLOW (A) 12/20/2019 1726   APPEARANCEUR CLEAR (A) 12/20/2019 1726   LABSPEC 1.030 12/20/2019 1726  PHURINE 6.0 12/20/2019 1726   GLUCOSEU 150 (A) 12/20/2019 1726   HGBUR NEGATIVE 12/20/2019 1726   BILIRUBINUR NEGATIVE 12/20/2019 1726   KETONESUR NEGATIVE 12/20/2019 1726   PROTEINUR NEGATIVE 12/20/2019 1726   UROBILINOGEN 1.0 06/07/2013 2149   NITRITE NEGATIVE 12/20/2019 1726   LEUKOCYTESUR NEGATIVE 12/20/2019 1726   Sepsis Labs Invalid input(s): PROCALCITONIN,  WBC,  LACTICIDVEN Microbiology Recent Results (from the past 240 hour(s))  Blood culture (routine x 2)     Status: None (Preliminary result)   Collection Time: 12/20/19  4:24 PM   Specimen: BLOOD  Result Value Ref Range Status   Specimen Description BLOOD LEFT ANTECUBITAL  Final   Special Requests   Final    BOTTLES DRAWN AEROBIC AND ANAEROBIC Blood Culture results may not be optimal due to an inadequate volume of blood received in culture bottles   Culture   Final    NO GROWTH 3 DAYS Performed at [facility], [address], [place], [place] [address]    Report Status PENDING  Incomplete  Blood culture (routine x 2)     Status: None (Preliminary result)   Collection Time: 12/20/19  5:27 PM   Specimen: BLOOD  Result Value Ref Range Status   Specimen Description BLOOD LT [facility]  Final   Special Requests   Final    BOTTLES DRAWN AEROBIC AND ANAEROBIC Blood Culture results may not be optimal due to an excessive volume of blood received in culture bottles   Culture   Final    NO GROWTH 3 DAYS Performed at [facility], [address], [place], [place] [address]    Report Status PENDING  Incomplete  SARS  CORONAVIRUS 2 (TAT 6-24 HRS) Nasopharyngeal     Status: None   Collection Time: 12/20/19  9:22 PM   Specimen: Nasopharyngeal  Result Value Ref Range Status   SARS Coronavirus 2 NEGATIVE NEGATIVE Final    Comment: (NOTE) SARS-CoV-2 target nucleic acids are NOT DETECTED. The SARS-CoV-2 RNA is generally detectable in upper and lower respiratory specimens during the acute phase of infection. Negative results do not preclude SARS-CoV-2 infection, do not rule out co-infections with other pathogens, and should not be used as the sole basis for treatment or other patient management decisions. Negative results must be combined with clinical observations, patient history, and epidemiological information. The expected result is Negative. Fact Sheet for Patients: SugarRoll.be Fact Sheet for Healthcare Providers: https://www.woods-mathews.com/ This test is not yet approved or cleared by the [place] FDA and  has been authorized for detection and/or diagnosis of SARS-CoV-2 by FDA under an Emergency Use Authorization (EUA). This EUA will remain  in effect (meaning this test can be used) for the duration of the COVID-19 declaration under Section 56 4(b)(1) of the Act, 21 U.S.C. section 360bbb-3(b)(1), unless the authorization is terminated or revoked sooner. Performed at [facility], [place] [address], [place], [place] [address]     Time coordinating discharge: Over 30 minutes  SIGNED:  Lorella Nimrod, MD  [facility] 12/23/2019, 5:23 PM  If 7PM-7AM, please contact night-coverage [email or web address]  This record has been created using Systems analyst. Errors have been sought and corrected,but may not always be located. Such creation errors do not reflect on the standard of care.

## 2019-12-23 NOTE — Anesthesia Postprocedure Evaluation (Signed)
Anesthesia Post Note  [name]  Procedure(s) Performed: ESOPHAGOGASTRODUODENOSCOPY (EGD) (N/A )  Anesthesia Type: General Level of consciousness: awake and alert and oriented Pain management: pain level controlled Vital Signs Assessment: post-procedure vital signs reviewed and stable Respiratory status: spontaneous breathing Cardiovascular status: blood pressure returned to baseline Anesthetic complications: no     Last Vitals:  Vitals:   12/22/19 2046 12/23/19 0423  BP: 139/90 109/81  Pulse: 79 72  Resp: 20 20  Temp: 36.8 C 36.8 C  SpO2: 96% 96%    Last Pain:  Vitals:   12/23/19 0817  TempSrc:   PainSc: 0-No pain                 Chistine Dematteo

## 2019-12-25 LAB — CULTURE, BLOOD (ROUTINE X 2)
Culture: NO GROWTH
Culture: NO GROWTH

## 2019-12-31 ENCOUNTER — Emergency Department
Admission: EM | Admit: 2019-12-31 | Discharge: 2019-12-31 | Disposition: A | Discharge status: Home / Self Care | Payer: Self-pay | Attending: Emergency Medicine | Admitting: Emergency Medicine

## 2019-12-31 ENCOUNTER — Encounter: Payer: Self-pay | Admitting: Emergency Medicine

## 2019-12-31 ENCOUNTER — Other Ambulatory Visit: Payer: Self-pay

## 2019-12-31 DIAGNOSIS — I2510 Atherosclerotic heart disease of native coronary artery without angina pectoris: Secondary | ICD-10-CM | POA: Insufficient documentation

## 2019-12-31 DIAGNOSIS — I1310 Hypertensive heart and chronic kidney disease without heart failure, with stage 1 through stage 4 chronic kidney disease, or unspecified chronic kidney disease: Secondary | ICD-10-CM | POA: Insufficient documentation

## 2019-12-31 DIAGNOSIS — Z7982 Long term (current) use of aspirin: Secondary | ICD-10-CM | POA: Insufficient documentation

## 2019-12-31 DIAGNOSIS — E119 Type 2 diabetes mellitus without complications: Secondary | ICD-10-CM | POA: Insufficient documentation

## 2019-12-31 DIAGNOSIS — Z7984 Long term (current) use of oral hypoglycemic drugs: Secondary | ICD-10-CM | POA: Insufficient documentation

## 2019-12-31 DIAGNOSIS — R55 Syncope and collapse: Secondary | ICD-10-CM | POA: Insufficient documentation

## 2019-12-31 DIAGNOSIS — Z79899 Other long term (current) drug therapy: Secondary | ICD-10-CM | POA: Insufficient documentation

## 2019-12-31 DIAGNOSIS — N189 Chronic kidney disease, unspecified: Secondary | ICD-10-CM | POA: Insufficient documentation

## 2019-12-31 LAB — CBC WITH DIFFERENTIAL/PLATELET
Abs Immature Granulocytes: 0.08 10*3/uL — ABNORMAL HIGH (ref 0.00–0.07)
Basophils Absolute: 0.1 10*3/uL (ref 0.0–0.1)
Basophils Relative: 1 %
Eosinophils Absolute: 0.1 10*3/uL (ref 0.0–0.5)
Eosinophils Relative: 1 %
HCT: 38.2 % — ABNORMAL LOW (ref 39.0–52.0)
Hemoglobin: 13.4 g/dL (ref 13.0–17.0)
Immature Granulocytes: 1 %
Lymphocytes Relative: 20 %
Lymphs Abs: 2 10*3/uL (ref 0.7–4.0)
MCH: 31.8 pg (ref 26.0–34.0)
MCHC: 35.1 g/dL (ref 30.0–36.0)
MCV: 90.5 fL (ref 80.0–100.0)
Monocytes Absolute: 0.9 10*3/uL (ref 0.1–1.0)
Monocytes Relative: 9 %
Neutro Abs: 6.9 10*3/uL (ref 1.7–7.7)
Neutrophils Relative %: 68 %
Platelets: 369 10*3/uL (ref 150–400)
RBC: 4.22 MIL/uL (ref 4.22–5.81)
RDW: 12 % (ref 11.5–15.5)
WBC: 10 10*3/uL (ref 4.0–10.5)
nRBC: 0 % (ref 0.0–0.2)

## 2019-12-31 LAB — COMPREHENSIVE METABOLIC PANEL
ALT: 26 U/L (ref 0–44)
AST: 19 U/L (ref 15–41)
Albumin: 3.8 g/dL (ref 3.5–5.0)
Alkaline Phosphatase: 69 U/L (ref 38–126)
Anion gap: 10 (ref 5–15)
BUN: 30 mg/dL — ABNORMAL HIGH (ref 8–23)
CO2: 26 mmol/L (ref 22–32)
Calcium: 9.4 mg/dL (ref 8.9–10.3)
Chloride: 95 mmol/L — ABNORMAL LOW (ref 98–111)
Creatinine, Ser: 1.2 mg/dL (ref 0.61–1.24)
GFR calc Af Amer: >60 mL/min (ref >60)
GFR calc non Af Amer: >60 mL/min (ref >60)
Glucose, Bld: 399 mg/dL — ABNORMAL HIGH (ref 70–99)
Potassium: 4.2 mmol/L (ref 3.5–5.1)
Sodium: 131 mmol/L — ABNORMAL LOW (ref 135–145)
Total Bilirubin: 0.8 mg/dL (ref 0.3–1.2)
Total Protein: 7.6 g/dL (ref 6.5–8.1)

## 2019-12-31 LAB — URINALYSIS, COMPLETE (UACMP) WITH MICROSCOPIC
Bacteria, UA: NONE SEEN
Bilirubin Urine: NEGATIVE
Glucose, UA: >=500 mg/dL — AB
Hgb urine dipstick: NEGATIVE
Ketones, ur: NEGATIVE mg/dL
Leukocytes,Ua: NEGATIVE
Nitrite: NEGATIVE
Protein, ur: NEGATIVE mg/dL
Specific Gravity, Urine: 1.017 (ref 1.005–1.030)
Squamous Epithelial / HPF: NONE SEEN (ref 0–5)
pH: 5 (ref 5.0–8.0)

## 2019-12-31 LAB — LACTIC ACID, PLASMA: Lactic Acid, Venous: 1.7 mmol/L (ref 0.5–1.9)

## 2019-12-31 LAB — GLUCOSE, CAPILLARY: Glucose-Capillary: 202 mg/dL — ABNORMAL HIGH (ref 70–99)

## 2019-12-31 LAB — AMMONIA: Ammonia: 16 umol/L (ref 9–35)

## 2019-12-31 MED ORDER — SODIUM CHLORIDE 0.9 % IV BOLUS
1000.0000 mL | Freq: Once | INTRAVENOUS | Status: AC
  Administered 2019-12-31: 1000 mL via INTRAVENOUS

## 2019-12-31 NOTE — ED Notes (Addendum)
See triage note. Pt states he began having weakness in legs this morning at work with new onset difficulty walking. States he was here Saturday and dx with abdominal ascites. Abdomen feels distended, reports nausea but no vomiting. Denies abdominal pain at this time. Denies fevers, SHOB. Pt in NAD at this time

## 2019-12-31 NOTE — ED Notes (Signed)
Pt given ice water as requested.  Call bell within reach. Stretcher locked in lowest position

## 2019-12-31 NOTE — Discharge Instructions (Addendum)
Follow-up with your regular doctor.  Continue take your normal medications as prescribed.  Return to the ER for new, worsening, or persistent weakness or dizziness, nausea or vomiting, feeling like you are going to pass out, abdominal pain, fever, or any other new or worsening symptoms that concern you.

## 2019-12-31 NOTE — ED Notes (Signed)
Discussed pt with dr Cherylann Banas. One set blood cx drawn and sent to lab if needed.

## 2019-12-31 NOTE — ED Triage Notes (Addendum)
Pt admitted end February for enteritis/sepsis.  Pt here today for feeling weak in legs, headache, decreased appetite and nausea. No vomiting or fever.  Has finished all abx from admission.  Alert and oriented at this time. BP lower than previous admission. Unlabored. NAD.

## 2019-12-31 NOTE — ED Provider Notes (Signed)
[facility] Emergency Department Provider Note ____________________________________________   First MD Initiated Contact with Patient 12/31/19 1741     (approximate)  I have reviewed the triage vital signs and the nursing notes.   HISTORY  Chief Complaint Weakness    HPI [name] is a 69 y.o. male with PMH as noted below including CAD, diabetes on insulin, chronic kidney disease, and stroke presents with generalized weakness, gradual onset today when the patient was at work, and now resolved.  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  He states that he felt like his legs were wobbly, and felt lightheaded as if he might pass out.  He had some associated nausea but no shortness of breath or chest discomfort.  The symptoms have now subsided after several hours.  He denies fever or chills, vomiting or diarrhea, abdominal pain, or urinary symptoms.  He states he felt a bit like this when he presented last month to the ER, but states that time he felt much worse.  Past Medical History:  Diagnosis Date  . Acute cerebral infarction ([place]) 11/2005   right caudate internal capsule secondary to small vessel  . CAD (coronary artery disease)    s/p MI in distant past (1995?); TTE 2010 - EF 55-60%  . Chronic kidney disease    per pt, had kidney disease 74-18 years old  . Diabetes mellitus without complication ([place])   . Hyperlipidemia   . Hypertension    fluctuates  . Lacunar infarction ([place]) 2006/2007   Right basal ganglion, chronic lacunar infarct  . Myocardial infarction [facility])    ?1995  . SBO (small bowel obstruction) ([place]) 09/2010   Resolved with conservative measures/ per pt has had 2 SBO!  . Stenosis of right carotid artery   . Stroke ([place]) 2010  . TIA (transient ischemic attack) 06/2009    Patient Active Problem List   Diagnosis Date Noted  . Enteritis   . Sepsis ([place]) 12/20/2019  . Gastritis  12/20/2019  . Hyponatremia 12/20/2019  . AKI (acute kidney injury) ([place]) 12/20/2019  . Essential hypertension 12/20/2019  . History of CVA (cerebrovascular accident) 12/20/2019  . Hyperglycemia due to type 2 diabetes mellitus ([place]) 12/20/2019  . Nonrheumatic tricuspid valve regurgitation 12/12/2018  . Small bowel obstruction ([place]) 06/08/2013  . Vomiting 06/08/2013  . Vertigo, benign positional 01/06/2012    Past Surgical History:  Procedure Laterality Date  . broken collarbone     right collarbone  . broken shoulder     right shoulder  . ESOPHAGOGASTRODUODENOSCOPY N/A 12/21/2019   Procedure: ESOPHAGOGASTRODUODENOSCOPY (EGD);  Surgeon: Toledo, Benay Pike, MD;  Location: [facility] ENDOSCOPY;  Service: Gastroenterology;  Laterality: N/A;  . FINGER SURGERY     right hand, tip of finger mashed and broken fingers  . LAPAROSCOPIC CHOLECYSTECTOMY  2009   by Dr. Dalbert Batman    Prior to Admission medications   Medication Sig Start Date End Date Taking? Authorizing Provider  aspirin EC 81 MG tablet Take 81 mg by mouth daily. For pain    [provider]  BD PEN NEEDLE MICRO U/F 32G X 6 MM MISC Use pen needles for insulin injection each evening 10/09/17   [provider]  gabapentin (NEURONTIN) 600 MG tablet Take 600-1,200 mg by mouth 2 (two) times daily. Take 1 tablet by mouth in the morning and 2 tablets in the evening 09/16/19   [provider]  LEVEMIR FLEXTOUCH 100 UNIT/ML Pen 48 UNITS IN THE MORNING AND 20 MINUTES AT NIGHT 10/06/17   [provider]  losartan-hydrochlorothiazide (HYZAAR) 100-25 MG tablet Take 1 tablet by mouth daily. 09/15/19   [provider]  meloxicam (MOBIC) 15 MG tablet Take 15 mg by mouth daily. 10/09/17   [provider]  metFORMIN (GLUCOPHAGE) 1000 MG tablet Take 1,000 mg by mouth 2 (two) times daily. 10/09/17   [provider]  rosuvastatin (CRESTOR) 10 MG tablet Take 10 mg by mouth at bedtime. 09/15/19    [provider]  sildenafil (REVATIO) 20 MG tablet TAKE 1 TABLET BY MOUTH prior TO sexual activity prn 08/31/17   [provider]  verapamil (VERELAN PM) 240 MG 24 hr capsule Take 240 mg by mouth daily.  10/09/17   [provider]    Allergies Bee venom and Penicillins  Family History  Problem Relation Age of Onset  . Lung cancer Mother   . Emphysema Mother   . Heart failure Father   . Emphysema Sister     Social History Social History   Tobacco Use  . Smoking status: Never Smoker  . Smokeless tobacco: Never Used  Substance Use Topics  . Alcohol use: No  . Drug use: No    Review of Systems  Constitutional: No fever.  Positive for resolved weakness. Eyes: No redness. ENT: No sore throat. Cardiovascular: Denies chest pain. Respiratory: Denies shortness of breath. Gastrointestinal: Positive for resolved nausea.  No vomiting or diarrhea. Genitourinary: Negative for dysuria.  Musculoskeletal: Negative for back pain. Skin: Negative for rash. Neurological: Negative for headaches, focal weakness or numbness.   ____________________________________________   PHYSICAL EXAM:  VITAL SIGNS: ED Triage Vitals  Enc Vitals Group     BP 12/31/19 1516 (!) 99/57     Pulse Rate 12/31/19 1516 74     Resp 12/31/19 1516 16     Temp 12/31/19 1516 98.6 F (37 C)     Temp Source 12/31/19 1516 Oral     SpO2 12/31/19 1516 96 %     Weight 12/31/19 1512 165 lb (74.8 kg)     Height 12/31/19 1512 5\' 4"  (1.626 m)     Head Circumference --      Peak Flow --      Pain Score 12/31/19 1511 5     Pain Loc --      Pain Edu? --      Excl. in [place]? --     Constitutional: Alert and oriented.  Relatively well appearing and in no acute distress. Eyes: Conjunctivae are normal.  No scleral icterus. Head: Atraumatic. Nose: No congestion/rhinnorhea. Mouth/Throat: Mucous membranes are moist.   Neck: Normal range of motion.  Cardiovascular: Normal rate, regular rhythm.    Good peripheral circulation. Respiratory: Normal respiratory effort.  No retractions.  Gastrointestinal: Soft and nontender.  Slight distention.  Genitourinary: No flank tenderness. Musculoskeletal: Extremities warm and well perfused.  Neurologic:  Normal speech and language. No gross focal neurologic deficits are appreciated.  Skin:  Skin is warm and dry. No rash noted. Psychiatric: Mood and affect are normal. Speech and behavior are normal.  ____________________________________________   LABS (all labs ordered are listed, but only abnormal results are displayed)  Labs Reviewed  URINALYSIS, COMPLETE (UACMP) WITH MICROSCOPIC - Abnormal; Notable for the following components:      Result Value   Color, Urine YELLOW (*)    APPearance CLEAR (*)    Glucose, UA >=500 (*)  All other components within normal limits  CBC WITH DIFFERENTIAL/PLATELET - Abnormal; Notable for the following components:   HCT 38.2 (*)    Abs Immature Granulocytes 0.08 (*)    All other components within normal limits  COMPREHENSIVE METABOLIC PANEL - Abnormal; Notable for the following components:   Sodium 131 (*)    Chloride 95 (*)    Glucose, Bld 399 (*)    BUN 30 (*)    All other components within normal limits  GLUCOSE, CAPILLARY - Abnormal; Notable for the following components:   Glucose-Capillary 202 (*)    All other components within normal limits  LACTIC ACID, PLASMA  AMMONIA  CBG MONITORING, ED   ____________________________________________  EKG  ED ECG REPORT I, Arta Silence, the attending physician, personally viewed and interpreted this ECG.  Date: 12/31/2019 EKG Time: 1523 Rate: 74 Rhythm: normal sinus rhythm QRS Axis: normal Intervals: normal ST/T Wave abnormalities: normal Narrative Interpretation: no evidence of acute ischemia  ____________________________________________  RADIOLOGY    ____________________________________________   PROCEDURES  Procedure(s)  performed: No  Procedures  Critical Care performed: No ____________________________________________   INITIAL IMPRESSION / ASSESSMENT AND PLAN / ED COURSE  Pertinent labs & imaging results that were available during my care of the patient were reviewed by me and considered in my medical decision making (see chart for details).  69 year old male with PMH as noted above presents with generalized weakness occurring today while the patient was working outdoors, and associated with lightheadedness and nausea.  The patient states that the symptoms have resolved and he now feels back to baseline.  I reviewed the past medical records in [place].  The patient was most recently admitted at the end of last month after presenting also for generalized weakness along with vomiting.  The patient states that he felt much worse that time.  CT showed findings consistent with gastritis.  The patient was hypotensive and had slightly elevated lactate and AKI.  He was initially treated for presumed sepsis and was put on Levaquin and Flagyl, which she continued upon discharge.  On exam today, the patient is overall relatively well-appearing.  His initial blood pressure was borderline low, however it is now normal without any intervention.  His other vital signs are normal.  The abdomen is soft and nontender.  Neurologic exam is nonfocal.  Overall presentation is consistent with a near syncopal type episode.  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  Initial lab work-up obtained from triage is unremarkable except for a glucose of 399, however his anion gap is normal.  The vital signs have normalized.  There is no evidence of acute infection or sepsis, and no abdominal pain or vomiting.  The patient had findings suggestive of liver disease on his CT on the last visit, but  has no known cirrhosis.  I will obtain an ammonia level, give a fluid bolus, recheck the glucose, and reassess.  If the patient's glucose improves and he continues to feel well with normal vital signs anticipate discharge home.  ----------------------------------------- 7:36 PM on 12/31/2019 -----------------------------------------  The glucose has improved to 202 after fluids.  The ammonia is normal.  At this time, the patient continues to be asymptomatic with stable vital signs (his respiratory rate is incorrectly recorded as 31, however he is breathing comfortably at a rate below 20 when I went to reevaluate  him).  The patient feels comfortable and is stable for discharge home.  I counseled him on the results of the work-up.  Return precautions given, and he expresses understanding.  ____________________________________________   FINAL CLINICAL IMPRESSION(S) / ED DIAGNOSES  Final diagnoses:  Near syncope      NEW MEDICATIONS STARTED DURING THIS VISIT:  New Prescriptions   No medications on file     Note:  This document was prepared using Dragon voice recognition software and may include unintentional dictation errors.    Arta Silence, MD 12/31/19 [phone]

## 2020-06-28 ENCOUNTER — Emergency Department: Payer: HRSA Program

## 2020-06-28 ENCOUNTER — Other Ambulatory Visit: Payer: Self-pay

## 2020-06-28 DIAGNOSIS — I2510 Atherosclerotic heart disease of native coronary artery without angina pectoris: Secondary | ICD-10-CM | POA: Diagnosis not present

## 2020-06-28 DIAGNOSIS — Z8673 Personal history of transient ischemic attack (TIA), and cerebral infarction without residual deficits: Secondary | ICD-10-CM | POA: Insufficient documentation

## 2020-06-28 DIAGNOSIS — I129 Hypertensive chronic kidney disease with stage 1 through stage 4 chronic kidney disease, or unspecified chronic kidney disease: Secondary | ICD-10-CM | POA: Insufficient documentation

## 2020-06-28 DIAGNOSIS — U071 COVID-19: Secondary | ICD-10-CM | POA: Insufficient documentation

## 2020-06-28 DIAGNOSIS — Z794 Long term (current) use of insulin: Secondary | ICD-10-CM | POA: Insufficient documentation

## 2020-06-28 DIAGNOSIS — E1122 Type 2 diabetes mellitus with diabetic chronic kidney disease: Secondary | ICD-10-CM | POA: Insufficient documentation

## 2020-06-28 DIAGNOSIS — Z7982 Long term (current) use of aspirin: Secondary | ICD-10-CM | POA: Insufficient documentation

## 2020-06-28 DIAGNOSIS — I252 Old myocardial infarction: Secondary | ICD-10-CM | POA: Diagnosis not present

## 2020-06-28 DIAGNOSIS — R519 Headache, unspecified: Secondary | ICD-10-CM | POA: Diagnosis present

## 2020-06-28 DIAGNOSIS — Z79899 Other long term (current) drug therapy: Secondary | ICD-10-CM | POA: Diagnosis not present

## 2020-06-28 DIAGNOSIS — N189 Chronic kidney disease, unspecified: Secondary | ICD-10-CM | POA: Diagnosis not present

## 2020-06-28 LAB — CBC
HCT: 37 % — ABNORMAL LOW (ref 39.0–52.0)
Hemoglobin: 12.9 g/dL — ABNORMAL LOW (ref 13.0–17.0)
MCH: 31.2 pg (ref 26.0–34.0)
MCHC: 34.9 g/dL (ref 30.0–36.0)
MCV: 89.4 fL (ref 80.0–100.0)
Platelets: 253 10*3/uL (ref 150–400)
RBC: 4.14 MIL/uL — ABNORMAL LOW (ref 4.22–5.81)
RDW: 12.4 % (ref 11.5–15.5)
WBC: 5.3 10*3/uL (ref 4.0–10.5)
nRBC: 0 % (ref 0.0–0.2)

## 2020-06-28 LAB — TROPONIN I (HIGH SENSITIVITY)
Troponin I (High Sensitivity): 8 ng/L (ref <18)
Troponin I (High Sensitivity): 9 ng/L (ref <18)

## 2020-06-28 LAB — BASIC METABOLIC PANEL
Anion gap: 10 (ref 5–15)
BUN: 21 mg/dL (ref 8–23)
CO2: 24 mmol/L (ref 22–32)
Calcium: 8.1 mg/dL — ABNORMAL LOW (ref 8.9–10.3)
Chloride: 99 mmol/L (ref 98–111)
Creatinine, Ser: 1 mg/dL (ref 0.61–1.24)
GFR calc Af Amer: >60 mL/min (ref >60)
GFR calc non Af Amer: >60 mL/min (ref >60)
Glucose, Bld: 282 mg/dL — ABNORMAL HIGH (ref 70–99)
Potassium: 3.7 mmol/L (ref 3.5–5.1)
Sodium: 133 mmol/L — ABNORMAL LOW (ref 135–145)

## 2020-06-28 NOTE — ED Triage Notes (Signed)
PT to ED c/o headache, abd pain, cough and chest pain. States he tested neg for covid 3 weeks ago but that was before these sx started. States grandaughter had flu and he thinks he caught these sx from her.

## 2020-06-29 ENCOUNTER — Emergency Department
Admission: EM | Admit: 2020-06-29 | Discharge: 2020-06-29 | Disposition: A | Discharge status: Home / Self Care | Payer: HRSA Program | Attending: Emergency Medicine | Admitting: Emergency Medicine

## 2020-06-29 ENCOUNTER — Telehealth: Payer: Self-pay | Admitting: Oncology

## 2020-06-29 DIAGNOSIS — U071 COVID-19: Secondary | ICD-10-CM

## 2020-06-29 LAB — SARS CORONAVIRUS 2 BY RT PCR (HOSPITAL ORDER, PERFORMED IN ~~LOC~~ HOSPITAL LAB): SARS Coronavirus 2: POSITIVE — AB

## 2020-06-29 LAB — FIBRIN DERIVATIVES D-DIMER (ARMC ONLY): Fibrin derivatives D-dimer (ARMC): 343.37 ng/mL (FEU) (ref 0.00–499.00)

## 2020-06-29 MED ORDER — ALBUTEROL SULFATE HFA 108 (90 BASE) MCG/ACT IN AERS
2.0000 | INHALATION_SPRAY | Freq: Once | RESPIRATORY_TRACT | Status: DC | PRN
  Filled 2020-06-29: qty 6.7

## 2020-06-29 MED ORDER — ACETAMINOPHEN 500 MG PO TABS
1000.0000 mg | ORAL_TABLET | Freq: Once | ORAL | Status: AC
  Administered 2020-06-29: 1000 mg via ORAL
  Filled 2020-06-29: qty 2

## 2020-06-29 MED ORDER — DIPHENHYDRAMINE HCL 50 MG/ML IJ SOLN: 50.0000 mg | Freq: Once | INTRAMUSCULAR | Status: DC | PRN

## 2020-06-29 MED ORDER — ONDANSETRON HCL 4 MG/2ML IJ SOLN
4.0000 mg | Freq: Once | INTRAMUSCULAR | Status: AC
  Administered 2020-06-29: 4 mg via INTRAVENOUS
  Filled 2020-06-29: qty 2

## 2020-06-29 MED ORDER — METHYLPREDNISOLONE SODIUM SUCC 125 MG IJ SOLR: 125.0000 mg | Freq: Once | INTRAMUSCULAR | Status: DC | PRN

## 2020-06-29 MED ORDER — FAMOTIDINE IN NACL 20-0.9 MG/50ML-% IV SOLN: 20.0000 mg | Freq: Once | INTRAVENOUS | Status: DC | PRN

## 2020-06-29 MED ORDER — EPINEPHRINE 0.3 MG/0.3ML IJ SOAJ: 0.3000 mg | Freq: Once | INTRAMUSCULAR | Status: DC | PRN

## 2020-06-29 MED ORDER — LACTATED RINGERS IV BOLUS
1000.0000 mL | Freq: Once | INTRAVENOUS | Status: AC
  Administered 2020-06-29: 1000 mL via INTRAVENOUS

## 2020-06-29 MED ORDER — SODIUM CHLORIDE 0.9 % IV SOLN: INTRAVENOUS | Status: DC | PRN

## 2020-06-29 MED ORDER — SODIUM CHLORIDE 0.9 % IV SOLN
1200.0000 mg | Freq: Once | INTRAVENOUS | Status: DC
  Filled 2020-06-29: qty 10

## 2020-06-29 NOTE — Discharge Instructions (Addendum)
Call [phone] today for an appointment to receive monoclonal antibodies for your Covid 19 infection.   To help boost your immune system against COVID-19, please take:  - Vitamin D3 4,000 IU/day - Vitamin C 500-1,000?mg twice a day - Quercetin 250?mg twice a day - Zinc 100?mg/day - Melatonin 10?mg before bedtime (causes drowsiness) - Aspirin 325?mg/day (unless contraindicated) - Pulse Oximeter Monitoring of oxygen saturation is recommended - check your oxygen 3 times a day if less than 90% return to the ER  These medications are all over-the-counter and do not need a prescription.   QUARANTINE INSTRUCTION  Follow these instructions at home:  Protecting others To avoid spreading the illness to other people: Quarantine in your home until you have had no cough and fever for 7 days. Household members should also be quarantine for at least 14 days after being exposed to you. Wash your hands often with soap and water. If soap and water are not available, use an alcohol-based hand sanitizer. If you have not cleaned your hands, do not touch your face. Make sure that all people in your household wash their hands well and often. Cover your nose and mouth when you cough or sneeze. Throw away used tissues. Stay home if you have any cold-like or flu-like symptoms. General instructions Go to your local pharmacy and buy a pulse oximeter (this is a machine that measures your oxygen). Check your oxygen levels at least 3 times a day. If your oxygen level is 92% or less return to the emergency room immediately Take over-the-counter and prescription medicines only as told by your health care provider. If you need medication for fever take tylenol or ibuprofen Drink enough fluid to keep your urine pale yellow. Rest at home as directed by your health care provider. Do not give aspirin to a child with the flu, because of the association with Reye's syndrome. Do not use tobacco products, including  cigarettes, chewing tobacco, and e-cigarettes. If you need help quitting, ask your health care provider. Keep all follow-up visits as told by your health care provider. This is important. How is this prevented? Avoid areas where an outbreak has been reported. Avoid large groups of people. Keep a safe distance from people who are coughing and sneezing. Do not touch your face if you have not cleaned your hands. When you are around people who are sick or might be sick, wear a mask to protect yourself. Contact a health care provider if: You have symptoms of SARS (cough, fever, chest pain, shortness of breath) that are not getting better at home. You have a fever. If you have difficulty breathing go to your local ER or call 911

## 2020-06-29 NOTE — ED Provider Notes (Signed)
[facility] Emergency Department Provider Note  ____________________________________________  Time seen: Approximately 5:04 AM  I have reviewed the triage vital signs and the nursing notes.   HISTORY  Chief Complaint Headache, Chest Pain, and Cough   HPI [name] is a 69 y.o. male the history of CAD, CKD, diabetes, hypertension, hyperlipidemia, and stroke who presents for evaluation of cough and chest pain.   Patient reports that he has been sick for 4 days.  He has had a moderate throbbing headache, nausea, vomiting, dry cough, chest tightness.  No fever, chills, body aches, sore throat, loss of taste or smell.  Patient did get exposed to her granddaughter with flulike symptoms.  He is not vaccinated for Covid.  Denies any known Covid exposures.  He denies any shortness of breath or diarrhea.  He denies any pleuritic chest pain, personal or family history of blood clots, recent travel immobilization, leg pain or swelling, hemoptysis or exogenous hormones.  Past Medical History:  Diagnosis Date  . Acute cerebral infarction ([place]) 11/2005   right caudate internal capsule secondary to small vessel  . CAD (coronary artery disease)    s/p MI in distant past (1995?); TTE 2010 - EF 55-60%  . Chronic kidney disease    per pt, had kidney disease 6-38 years old  . Diabetes mellitus without complication ([place])   . Hyperlipidemia   . Hypertension    fluctuates  . Lacunar infarction ([place]) 2006/2007   Right basal ganglion, chronic lacunar infarct  . Myocardial infarction [facility])    ?1995  . SBO (small bowel obstruction) ([place]) 09/2010   Resolved with conservative measures/ per pt has had 2 SBO!  . Stenosis of right carotid artery   . Stroke ([place]) 2010  . TIA (transient ischemic attack) 06/2009    Patient Active Problem List   Diagnosis Date Noted  . Enteritis   . Sepsis ([place]) 12/20/2019  . Gastritis 12/20/2019  . Hyponatremia 12/20/2019  . AKI (acute  kidney injury) ([place]) 12/20/2019  . Essential hypertension 12/20/2019  . History of CVA (cerebrovascular accident) 12/20/2019  . Hyperglycemia due to type 2 diabetes mellitus ([place]) 12/20/2019  . Nonrheumatic tricuspid valve regurgitation 12/12/2018  . Small bowel obstruction ([place]) 06/08/2013  . Vomiting 06/08/2013  . Vertigo, benign positional 01/06/2012    Past Surgical History:  Procedure Laterality Date  . broken collarbone     right collarbone  . broken shoulder     right shoulder  . ESOPHAGOGASTRODUODENOSCOPY N/A 12/21/2019   Procedure: ESOPHAGOGASTRODUODENOSCOPY (EGD);  Surgeon: Toledo, Benay Pike, MD;  Location: [facility] ENDOSCOPY;  Service: Gastroenterology;  Laterality: N/A;  . FINGER SURGERY     right hand, tip of finger mashed and broken fingers  . LAPAROSCOPIC CHOLECYSTECTOMY  2009   by Dr. Dalbert Batman    Prior to Admission medications   Medication Sig Start Date End Date Taking? Authorizing Provider  aspirin EC 81 MG tablet Take 81 mg by mouth daily. For pain    [provider]  BD PEN NEEDLE MICRO U/F 32G X 6 MM MISC Use pen needles for insulin injection each evening 10/09/17   [provider]  gabapentin (NEURONTIN) 600 MG tablet Take 600-1,200 mg by mouth 2 (two) times daily. Take 1 tablet by mouth in the morning and 2 tablets in the evening 09/16/19   [provider]  LEVEMIR FLEXTOUCH 100 UNIT/ML Pen 48 UNITS IN THE MORNING AND 20 MINUTES AT NIGHT 10/06/17   [provider]  losartan-hydrochlorothiazide (HYZAAR) 100-25 MG tablet Take 1 tablet by mouth daily. 09/15/19   [provider]  meloxicam (MOBIC) 15 MG tablet Take 15 mg by mouth daily. 10/09/17   [provider]  metFORMIN (GLUCOPHAGE) 1000 MG tablet Take 1,000 mg by mouth 2 (two) times daily. 10/09/17   [provider]  rosuvastatin (CRESTOR) 10 MG tablet Take 10 mg by mouth at bedtime. 09/15/19   [provider]  sildenafil (REVATIO) 20 MG tablet  TAKE 1 TABLET BY MOUTH prior TO sexual activity prn 08/31/17   [provider]  verapamil (VERELAN PM) 240 MG 24 hr capsule Take 240 mg by mouth daily.  10/09/17   [provider]    Allergies Bee venom and Penicillins  Family History  Problem Relation Age of Onset  . Lung cancer Mother   . Emphysema Mother   . Heart failure Father   . Emphysema Sister     Social History Social History   Tobacco Use  . Smoking status: Never Smoker  . Smokeless tobacco: Never Used  Vaping Use  . Vaping Use: Never used  Substance Use Topics  . Alcohol use: No  . Drug use: No    Review of Systems  Constitutional: Negative for fever. Eyes: Negative for visual changes. ENT: Negative for sore throat. Neck: No neck pain  Cardiovascular: Negative for chest pain. + Chest tightness Respiratory: Negative for shortness of breath. Gastrointestinal: Negative for abdominal pain, diarrhea. + N/V Genitourinary: Negative for dysuria. Musculoskeletal: Negative for back pain. Skin: Negative for rash. Neurological: Negative for headaches, weakness or numbness. Psych: No SI or HI  ____________________________________________   PHYSICAL EXAM:  VITAL SIGNS: ED Triage Vitals  Enc Vitals Group     BP 06/28/20 1921 (!) 157/88     Pulse Rate 06/28/20 1906 73     Resp 06/28/20 1906 20     Temp 06/28/20 1906 99 F (37.2 C)     Temp Source 06/28/20 1906 Oral     SpO2 06/28/20 1906 98 %     Weight 06/28/20 1906 160 lb (72.6 kg)     Height 06/28/20 1906 5\' 4"  (1.626 m)     Head Circumference --      Peak Flow --      Pain Score 06/28/20 1906 10     Pain Loc --      Pain Edu? --      Excl. in [place]? --     Constitutional: Alert and oriented. Well appearing and in no apparent distress. HEENT:      Head: Normocephalic and atraumatic.         Eyes: Conjunctivae are normal. Sclera is non-icteric.       Mouth/Throat: Mucous membranes are moist.       Neck: Supple with no signs of  meningismus. Cardiovascular: Regular rate and rhythm. No murmurs, gallops, or rubs. 2+ symmetrical distal pulses are present in all extremities. No JVD. Respiratory: Normal respiratory effort. Lungs are clear to auscultation bilaterally.  Gastrointestinal: Soft, non tender. Musculoskeletal:  No edema, cyanosis, or erythema of extremities. Neurologic: Normal speech and language. Face is symmetric. Moving all extremities. No gross focal neurologic deficits are appreciated. Skin: Skin is warm, dry and intact. No rash noted. Psychiatric: Mood and affect are normal. Speech and behavior are normal.  ____________________________________________   LABS (all labs ordered are listed, but only abnormal results are displayed)  Labs Reviewed  SARS CORONAVIRUS 2 BY RT PCR ([place]  [facility]) - Abnormal; Notable for the following components:      Result Value   SARS Coronavirus 2 POSITIVE (*)    All other components within normal limits  BASIC METABOLIC PANEL - Abnormal; Notable for the following components:   Sodium 133 (*)    Glucose, Bld 282 (*)    Calcium 8.1 (*)    All other components within normal limits  CBC - Abnormal; Notable for the following components:   RBC 4.14 (*)    Hemoglobin 12.9 (*)    HCT 37.0 (*)    All other components within normal limits  FIBRIN DERIVATIVES D-DIMER ([facility] ONLY)  TROPONIN I (HIGH SENSITIVITY)  TROPONIN I (HIGH SENSITIVITY)   ____________________________________________  EKG  ED ECG REPORT I, Rudene Re, the attending physician, personally viewed and interpreted this ECG.  Normal sinus rhythm, rate of 71, normal intervals, normal axis, no ST elevations or depressions.  Normal EKG. ____________________________________________  RADIOLOGY  I have personally reviewed the images performed during this visit and I agree with the Radiologist's read.   Interpretation by Radiologist:  DG Chest 2 View  Result  Date: 06/28/2020 CLINICAL DATA:  Cough and chest pain.  Headache. EXAM: CHEST - 2 VIEW COMPARISON:  Radiograph 12/20/2019 FINDINGS: Normal heart size with unchanged mediastinal contours. There is chronic hyperinflation and bronchial thickening. Biapical pleuroparenchymal scarring is unchanged. There is no acute airspace disease, pleural effusion or pneumothorax. No acute osseous abnormalities are seen. IMPRESSION: 1. No acute chest findings. 2. Chronic hyperinflation and bronchial thickening. Electronically Signed   By: Keith Rake M.D.   On: 06/28/2020 20:31     ____________________________________________   PROCEDURES  Procedure(s) performed:yes .1-3 Lead EKG Interpretation Performed by: Rudene Re, MD Authorized by: Rudene Re, MD     Interpretation: normal     ECG rate assessment: normal     Rhythm: sinus rhythm     Ectopy: none     Critical Care performed:  None ____________________________________________   INITIAL IMPRESSION / ASSESSMENT AND PLAN / ED COURSE  69 y.o. male the history of CAD, CKD, diabetes, hypertension, hyperlipidemia, and stroke who presents for evaluation of cough, chest tightness, and HA for 4 days.  Patient is positive for Covid here.  Unvaccinated.  Granddaughter with similar symptoms.  Lives with wife and 6 kids.  No shortness of breath, normal work of breathing, lungs are clear to auscultation, no hypoxia at rest and with ambulation.  Complaining of mild chest tightness.  No history of smoking, COPD or asthma.  No wheezing.  EKG showing no acute ischemic changes.  High-sensitivity troponin x2 - with no evidence of ischemia or myocarditis.  Will send a D-dimer to rule out PE in the setting of positive Covid.  No significant electrolyte derangements, mild hyperglycemia with no evidence of DKA, no leukocytosis, mild stable anemia.  Chest x-ray showing no infiltrates, confirmed by radiology.  Will give IV fluids, Zofran, and Tylenol for symptom  relief.  Patient is a great candidate for monoclonal antibodies.  I did discuss with the patient receiving monoclonal antibodies here in the ED versus outpatient in our clinic.  Patient has opted to do it as an outpatient.  I have sent a request to the clinic for close follow-up.  Patient was also provided with the contact of the clinic and instructed to call first thing in the morning for an appointment.  I discussed quarantine, immune system boost, and close monitoring of hypoxia at home.  Discussed importance of  have his family members quarantine and test as well.  Discussed standard return precautions for any signs of dehydration, worsening chest pain, hypoxia.  _________________________ 5:57 AM on 06/29/2020 -----------------------------------------  D-dimer negative.  Patient remains with no shortness of breath, hypoxia, increased work of breathing, or any respiratory distress.  Stable for discharge home with outpatient immunoglobulin infusion.    _____________________________________________ Please note:  Patient was evaluated in Emergency Department today for the symptoms described in the history of present illness. Patient was evaluated in the context of the global COVID-19 pandemic, which necessitated consideration that the patient might be at risk for infection with the SARS-CoV-2 virus that causes COVID-19. Institutional protocols and algorithms that pertain to the evaluation of patients at risk for COVID-19 are in a state of rapid change based on information released by regulatory bodies including the CDC and federal and state organizations. These policies and algorithms were followed during the patient's care in the ED.  Some ED evaluations and interventions may be delayed as a result of limited staffing during the pandemic.   [place] Controlled Substance Database was reviewed by me. ____________________________________________   FINAL CLINICAL IMPRESSION(S) / ED DIAGNOSES   Final  diagnoses:  COVID-19      NEW MEDICATIONS STARTED DURING THIS VISIT:  ED Discharge Orders    None       Note:  This document was prepared using Dragon voice recognition software and may include unintentional dictation errors.    Alfred Levins, [place], MD 06/29/20 [phone]

## 2020-06-30 ENCOUNTER — Other Ambulatory Visit: Payer: Self-pay | Admitting: Nurse Practitioner

## 2020-06-30 ENCOUNTER — Ambulatory Visit (HOSPITAL_COMMUNITY)
Admission: RE | Admit: 2020-06-30 | Discharge: 2020-06-30 | Disposition: A | Discharge status: Home / Self Care | Payer: HRSA Program | Source: Ambulatory Visit | Attending: Pulmonary Disease | Admitting: Pulmonary Disease

## 2020-06-30 DIAGNOSIS — U071 COVID-19: Secondary | ICD-10-CM

## 2020-06-30 MED ORDER — ALBUTEROL SULFATE HFA 108 (90 BASE) MCG/ACT IN AERS: 2.0000 | INHALATION_SPRAY | Freq: Once | RESPIRATORY_TRACT | Status: DC | PRN

## 2020-06-30 MED ORDER — DIPHENHYDRAMINE HCL 50 MG/ML IJ SOLN: 50.0000 mg | Freq: Once | INTRAMUSCULAR | Status: DC | PRN

## 2020-06-30 MED ORDER — SODIUM CHLORIDE 0.9 % IV SOLN: INTRAVENOUS | Status: DC | PRN

## 2020-06-30 MED ORDER — EPINEPHRINE 0.3 MG/0.3ML IJ SOAJ: 0.3000 mg | Freq: Once | INTRAMUSCULAR | Status: DC | PRN

## 2020-06-30 MED ORDER — SODIUM CHLORIDE 0.9 % IV SOLN
1200.0000 mg | Freq: Once | INTRAVENOUS | Status: AC
  Administered 2020-06-30: 1200 mg via INTRAVENOUS
  Filled 2020-06-30: qty 10

## 2020-06-30 MED ORDER — FAMOTIDINE IN NACL 20-0.9 MG/50ML-% IV SOLN: 20.0000 mg | Freq: Once | INTRAVENOUS | Status: DC | PRN

## 2020-06-30 MED ORDER — METHYLPREDNISOLONE SODIUM SUCC 125 MG IJ SOLR: 125.0000 mg | Freq: Once | INTRAMUSCULAR | Status: DC | PRN

## 2020-06-30 NOTE — Progress Notes (Signed)
  Diagnosis: COVID-19  Physician:Dr. Patrick Wright  Procedure: Covid Infusion Clinic Med: casirivimab\imdevimab infusion - Provided patient with casirivimab\imdevimab fact sheet for patients, parents and caregivers prior to infusion.  Complications: No immediate complications noted.  Discharge: Discharged home   [name] 06/30/2020   

## 2020-06-30 NOTE — Discharge Instructions (Signed)

## 2020-06-30 NOTE — Progress Notes (Signed)
I connected by phone with [name] on 06/30/2020 at 9:24 AM to discuss the potential use of a new treatment for mild to moderate COVID-19 viral infection in non-hospitalized patients.  This patient is a 69 y.o. male that meets the FDA criteria for Emergency Use Authorization of COVID monoclonal antibody casirivimab/imdevimab.  Has a (+) direct SARS-CoV-2 viral test result  Has mild or moderate COVID-19   Is NOT hospitalized due to COVID-19  Is within 10 days of symptom onset  Has at least one of the high risk factor(s) for progression to severe COVID-19 and/or hospitalization as defined in EUA.  Specific high risk criteria : BMI > 25   I have spoken and communicated the following to the patient or parent/caregiver regarding COVID monoclonal antibody treatment:  1. FDA has authorized the emergency use for the treatment of mild to moderate COVID-19 in adults and pediatric patients with positive results of direct SARS-CoV-2 viral testing who are 33 years of age and older weighing at least 40 kg, and who are at high risk for progressing to severe COVID-19 and/or hospitalization.  2. The significant known and potential risks and benefits of COVID monoclonal antibody, and the extent to which such potential risks and benefits are unknown.  3. Information on available alternative treatments and the risks and benefits of those alternatives, including clinical trials.  4. Patients treated with COVID monoclonal antibody should continue to self-isolate and use infection control measures (e.g., wear mask, isolate, social distance, avoid sharing personal items, clean and disinfect "high touch" surfaces, and frequent handwashing) according to CDC guidelines.   5. The patient or parent/caregiver has the option to accept or refuse COVID monoclonal antibody treatment.  After reviewing this information with the patient, The patient agreed to proceed with receiving casirivimab\imdevimab infusion and  will be provided a copy of the Fact sheet prior to receiving the infusion. [name] 06/30/2020 9:24 AM

## 2020-11-25 NOTE — Telephone Encounter (Signed)
Error

## 2021-01-28 DIAGNOSIS — E119 Type 2 diabetes mellitus without complications: Secondary | ICD-10-CM

## 2021-05-05 ENCOUNTER — Encounter: Payer: Self-pay | Admitting: *Deleted

## 2023-02-04 ENCOUNTER — Emergency Department: Payer: Self-pay

## 2023-02-04 ENCOUNTER — Other Ambulatory Visit: Payer: Self-pay

## 2023-02-04 ENCOUNTER — Emergency Department
Admission: EM | Admit: 2023-02-04 | Discharge: 2023-02-04 | Disposition: A | Discharge status: Home / Self Care | Payer: Self-pay | Attending: Emergency Medicine | Admitting: Emergency Medicine

## 2023-02-04 DIAGNOSIS — I130 Hypertensive heart and chronic kidney disease with heart failure and stage 1 through stage 4 chronic kidney disease, or unspecified chronic kidney disease: Secondary | ICD-10-CM | POA: Insufficient documentation

## 2023-02-04 DIAGNOSIS — I2510 Atherosclerotic heart disease of native coronary artery without angina pectoris: Secondary | ICD-10-CM | POA: Insufficient documentation

## 2023-02-04 DIAGNOSIS — Z1152 Encounter for screening for COVID-19: Secondary | ICD-10-CM | POA: Insufficient documentation

## 2023-02-04 DIAGNOSIS — R531 Weakness: Secondary | ICD-10-CM | POA: Insufficient documentation

## 2023-02-04 DIAGNOSIS — R42 Dizziness and giddiness: Secondary | ICD-10-CM | POA: Insufficient documentation

## 2023-02-04 DIAGNOSIS — N189 Chronic kidney disease, unspecified: Secondary | ICD-10-CM | POA: Insufficient documentation

## 2023-02-04 DIAGNOSIS — I509 Heart failure, unspecified: Secondary | ICD-10-CM | POA: Insufficient documentation

## 2023-02-04 DIAGNOSIS — E1122 Type 2 diabetes mellitus with diabetic chronic kidney disease: Secondary | ICD-10-CM | POA: Insufficient documentation

## 2023-02-04 DIAGNOSIS — R110 Nausea: Secondary | ICD-10-CM | POA: Insufficient documentation

## 2023-02-04 LAB — RESP PANEL BY RT-PCR (RSV, FLU A&B, COVID)  RVPGX2
Influenza A by PCR: NEGATIVE
Influenza B by PCR: NEGATIVE
Resp Syncytial Virus by PCR: NEGATIVE
SARS Coronavirus 2 by RT PCR: NEGATIVE

## 2023-02-04 LAB — TROPONIN I (HIGH SENSITIVITY)
Troponin I (High Sensitivity): 5 ng/L (ref <18)
Troponin I (High Sensitivity): 3 ng/L (ref <18)

## 2023-02-04 LAB — URINALYSIS, COMPLETE (UACMP) WITH MICROSCOPIC
Bacteria, UA: NONE SEEN
Bilirubin Urine: NEGATIVE
Glucose, UA: >=500 mg/dL — AB
Hgb urine dipstick: NEGATIVE
Ketones, ur: NEGATIVE mg/dL
Leukocytes,Ua: NEGATIVE
Nitrite: NEGATIVE
Protein, ur: 100 mg/dL — AB
Specific Gravity, Urine: 1.02 (ref 1.005–1.030)
Squamous Epithelial / HPF: NONE SEEN /HPF (ref 0–5)
pH: 5 (ref 5.0–8.0)

## 2023-02-04 LAB — CBC
HCT: 42.6 % (ref 39.0–52.0)
Hemoglobin: 14.7 g/dL (ref 13.0–17.0)
MCH: 30.7 pg (ref 26.0–34.0)
MCHC: 34.5 g/dL (ref 30.0–36.0)
MCV: 88.9 fL (ref 80.0–100.0)
Platelets: 280 10*3/uL (ref 150–400)
RBC: 4.79 MIL/uL (ref 4.22–5.81)
RDW: 12.1 % (ref 11.5–15.5)
WBC: 7.5 10*3/uL (ref 4.0–10.5)
nRBC: 0 % (ref 0.0–0.2)

## 2023-02-04 LAB — BASIC METABOLIC PANEL
Anion gap: 7 (ref 5–15)
BUN: 17 mg/dL (ref 8–23)
CO2: 26 mmol/L (ref 22–32)
Calcium: 8.5 mg/dL — ABNORMAL LOW (ref 8.9–10.3)
Chloride: 98 mmol/L (ref 98–111)
Creatinine, Ser: 1.1 mg/dL (ref 0.61–1.24)
GFR, Estimated: >60 mL/min (ref >60)
Glucose, Bld: 255 mg/dL — ABNORMAL HIGH (ref 70–99)
Potassium: 3.5 mmol/L (ref 3.5–5.1)
Sodium: 131 mmol/L — ABNORMAL LOW (ref 135–145)

## 2023-02-04 MED ORDER — ONDANSETRON HCL 4 MG/2ML IJ SOLN
4.0000 mg | Freq: Once | INTRAMUSCULAR | Status: AC
  Administered 2023-02-04: 4 mg via INTRAVENOUS
  Filled 2023-02-04: qty 2

## 2023-02-04 MED ORDER — ONDANSETRON 4 MG PO TBDP: 4.0000 mg | ORAL_TABLET | Freq: Three times a day (TID) | ORAL | 0 refills | Status: DC | PRN

## 2023-02-04 MED ORDER — SODIUM CHLORIDE 0.9 % IV BOLUS
1000.0000 mL | Freq: Once | INTRAVENOUS | Status: AC
  Administered 2023-02-04: 1000 mL via INTRAVENOUS

## 2023-02-04 NOTE — ED Triage Notes (Signed)
Pt to ED from home, [facility] for dizziness since about 2 days. No falls. Denies cardiac, pulmonary med hx. Pt unsure if takes blood thinners. Hx DM, takes insulin. Denies CP, SOB. No unilateral weakness. Pt also states that about 1 hour ago he could not move both his feet.  CBG 352, then 251. 130/80, HR 68, SPO2 96% on RA. 20# R AC  States that about 1 month ago, fell 5 times in 1 day because was dizzy.  EMS told CN may have seen bedbugs. Not seen by this RN in triage.   In chart, PMH includes CAD, TIA, stroke, MI, CKD.

## 2023-02-04 NOTE — ED Notes (Signed)
Pt ambulated in hallway with dizziness. Pt states he feels like he is at his baseline.

## 2023-02-04 NOTE — ED Provider Notes (Signed)
[facility] Provider Note    Event Date/Time   First MD Initiated Contact with Patient 02/04/23 1744     (approximate)  History   Chief Complaint: Dizziness  HPI  [name] is a 72 y.o. male with a past medical history of prior CVA, CAD, CKD, diabetes, hypertension, hyperlipidemia, presents to the emergency department with concerns over dizziness and decreased sensation of his feet.  According to the patient he was walking to the corner [employer] store when he began feeling like he could not move his feet.  States he felt like he wanted to move them but he felt like his body would not move them.  Patient denies any fever cough or congestion.  States dizziness the patient was nauseated upon arrival with an episode of vomiting in the emergency department.  No urinary symptoms.  No upper respiratory symptoms.  Physical Exam   Triage Vital Signs: ED Triage Vitals  Enc Vitals Group     BP 02/04/23 1705 109/76     Pulse Rate 02/04/23 1705 71     Resp 02/04/23 1705 15     Temp 02/04/23 1711 97.9 F (36.6 C)     Temp Source 02/04/23 1705 Oral     SpO2 02/04/23 1705 96 %     Weight 02/04/23 1706 142 lb (64.4 kg)     Height 02/04/23 1706  (1.626 m)     Head Circumference --      Peak Flow --      Pain Score 02/04/23 1705 0     Pain Loc --      Pain Edu? --      Excl. in [place]? --     Most recent vital signs: Vitals:   02/04/23 1705 02/04/23 1711  BP: 109/76   Pulse: 71   Resp: 15   Temp:  97.9 F (36.6 C)  SpO2: 96%     General: Awake, no distress.  CV:  Good peripheral perfusion.  Regular rate and rhythm  Resp:  Normal effort.  Equal breath sounds bilaterally.  Abd:  No distention.  Soft, nontender.  No rebound or guarding. Other:  Good grip strength bilaterally.  4+/5 strength in bilateral upper extremities 4/5 strength in bilateral lower extremities.  No cranial nerve deficits.  ED Results / Procedures / Treatments   EKG  EKG viewed  and interpreted by myself shows sinus rhythm at 71 bpm with a narrow QRS, normal axis, normal intervals, no concerning ST changes.  RADIOLOGY  I have reviewed and interpreted CT head images.  No obvious infarct or bleed seen on my evaluation. Radiology is read the CT scan as negative. Chest x-ray is clear.  MEDICATIONS ORDERED IN ED: Medications  ondansetron (ZOFRAN) injection 4 mg (has no administration in time range)  sodium chloride 0.9 % bolus 1,000 mL (1,000 mLs Intravenous New Bag/Given 02/04/23 1826)     IMPRESSION / MDM / ASSESSMENT AND PLAN / ED COURSE  I reviewed the triage vital signs and the nursing notes.  Patient's presentation is most consistent with acute presentation with potential threat to life or bodily function.  Patient presents to the emergency department for dizziness nausea decree sensation of both of his feet.  Denies any unilateral symptoms.  No obvious cranial nerve deficits seen on exam besides some generalized weakness.  Patient CT scan of the head is negative for bleed, chest x-ray is clear, lab work shows mild hyperglycemia 255 otherwise reassuring chemistry normal  CBC negative troponin.  We will IV hydrate.  We will check a urine sample and continue to closely monitor.  We will also check a COVID swab given the patient's generalized weakness and nausea.  Differential would still include COVID or influenza, urinary tract infection, dehydration, less likely CVA.  Patient states he is feeling much better.  Denies any further dizziness or nausea.  Patient's workup has been reassuring with a normal urinalysis, negative troponin, negative COVID/flu/RSV, reassuring chemistry, reassuring CBC CT scan of the head is negative and chest x-ray is clear.  Given the patient's reassuring workup and the fact that he states he is feeling much better no longer feels dizzy and wishes to go home I believe the patient is safe for discharge home with PCP follow-up.  Provided my typical  dizziness return precautions.  FINAL CLINICAL IMPRESSION(S) / ED DIAGNOSES   Weakness Dizziness   Note:  This document was prepared using Dragon voice recognition software and may include unintentional dictation errors.   Minna Antis, MD 02/04/23 2128

## 2023-02-16 ENCOUNTER — Emergency Department: Payer: Medicare Other

## 2023-02-16 ENCOUNTER — Inpatient Hospital Stay
Admission: EM | Admit: 2023-02-16 | Discharge: 2023-02-20 | DRG: 536 | Disposition: A | Discharge status: Skilled Nursing Facility | Payer: Medicare Other | Attending: Obstetrics and Gynecology | Admitting: Obstetrics and Gynecology

## 2023-02-16 ENCOUNTER — Other Ambulatory Visit: Payer: Self-pay

## 2023-02-16 DIAGNOSIS — I10 Essential (primary) hypertension: Secondary | ICD-10-CM | POA: Diagnosis present

## 2023-02-16 DIAGNOSIS — E11649 Type 2 diabetes mellitus with hypoglycemia without coma: Secondary | ICD-10-CM | POA: Diagnosis not present

## 2023-02-16 DIAGNOSIS — Y92096 Garden or yard of other non-institutional residence as the place of occurrence of the external cause: Secondary | ICD-10-CM

## 2023-02-16 DIAGNOSIS — R296 Repeated falls: Secondary | ICD-10-CM | POA: Diagnosis present

## 2023-02-16 DIAGNOSIS — I252 Old myocardial infarction: Secondary | ICD-10-CM | POA: Diagnosis not present

## 2023-02-16 DIAGNOSIS — S72009A Fracture of unspecified part of neck of unspecified femur, initial encounter for closed fracture: Secondary | ICD-10-CM | POA: Diagnosis present

## 2023-02-16 DIAGNOSIS — M25552 Pain in left hip: Secondary | ICD-10-CM

## 2023-02-16 DIAGNOSIS — E871 Hypo-osmolality and hyponatremia: Secondary | ICD-10-CM | POA: Diagnosis present

## 2023-02-16 DIAGNOSIS — F32A Depression, unspecified: Secondary | ICD-10-CM | POA: Diagnosis present

## 2023-02-16 DIAGNOSIS — E1142 Type 2 diabetes mellitus with diabetic polyneuropathy: Secondary | ICD-10-CM | POA: Diagnosis present

## 2023-02-16 DIAGNOSIS — Z8249 Family history of ischemic heart disease and other diseases of the circulatory system: Secondary | ICD-10-CM | POA: Diagnosis not present

## 2023-02-16 DIAGNOSIS — Z7982 Long term (current) use of aspirin: Secondary | ICD-10-CM | POA: Diagnosis not present

## 2023-02-16 DIAGNOSIS — E1122 Type 2 diabetes mellitus with diabetic chronic kidney disease: Secondary | ICD-10-CM | POA: Diagnosis present

## 2023-02-16 DIAGNOSIS — E785 Hyperlipidemia, unspecified: Secondary | ICD-10-CM | POA: Diagnosis present

## 2023-02-16 DIAGNOSIS — W19XXXA Unspecified fall, initial encounter: Principal | ICD-10-CM

## 2023-02-16 DIAGNOSIS — Z7984 Long term (current) use of oral hypoglycemic drugs: Secondary | ICD-10-CM

## 2023-02-16 DIAGNOSIS — I2510 Atherosclerotic heart disease of native coronary artery without angina pectoris: Secondary | ICD-10-CM | POA: Diagnosis present

## 2023-02-16 DIAGNOSIS — S72115A Nondisplaced fracture of greater trochanter of left femur, initial encounter for closed fracture: Secondary | ICD-10-CM | POA: Diagnosis present

## 2023-02-16 DIAGNOSIS — Z791 Long term (current) use of non-steroidal anti-inflammatories (NSAID): Secondary | ICD-10-CM

## 2023-02-16 DIAGNOSIS — E1165 Type 2 diabetes mellitus with hyperglycemia: Secondary | ICD-10-CM | POA: Diagnosis present

## 2023-02-16 DIAGNOSIS — E559 Vitamin D deficiency, unspecified: Secondary | ICD-10-CM | POA: Diagnosis present

## 2023-02-16 DIAGNOSIS — Z825 Family history of asthma and other chronic lower respiratory diseases: Secondary | ICD-10-CM | POA: Diagnosis not present

## 2023-02-16 DIAGNOSIS — Z794 Long term (current) use of insulin: Secondary | ICD-10-CM | POA: Diagnosis not present

## 2023-02-16 DIAGNOSIS — Z8673 Personal history of transient ischemic attack (TIA), and cerebral infarction without residual deficits: Secondary | ICD-10-CM | POA: Diagnosis not present

## 2023-02-16 DIAGNOSIS — Z880 Allergy status to penicillin: Secondary | ICD-10-CM

## 2023-02-16 DIAGNOSIS — Y9301 Activity, walking, marching and hiking: Secondary | ICD-10-CM | POA: Diagnosis present

## 2023-02-16 DIAGNOSIS — Z91030 Bee allergy status: Secondary | ICD-10-CM | POA: Diagnosis not present

## 2023-02-16 DIAGNOSIS — I6521 Occlusion and stenosis of right carotid artery: Secondary | ICD-10-CM | POA: Diagnosis present

## 2023-02-16 DIAGNOSIS — Z79899 Other long term (current) drug therapy: Secondary | ICD-10-CM

## 2023-02-16 DIAGNOSIS — E119 Type 2 diabetes mellitus without complications: Secondary | ICD-10-CM

## 2023-02-16 DIAGNOSIS — Z801 Family history of malignant neoplasm of trachea, bronchus and lung: Secondary | ICD-10-CM | POA: Diagnosis not present

## 2023-02-16 DIAGNOSIS — S298XXA Other specified injuries of thorax, initial encounter: Secondary | ICD-10-CM

## 2023-02-16 DIAGNOSIS — S72002A Fracture of unspecified part of neck of left femur, initial encounter for closed fracture: Secondary | ICD-10-CM | POA: Diagnosis present

## 2023-02-16 DIAGNOSIS — E1151 Type 2 diabetes mellitus with diabetic peripheral angiopathy without gangrene: Secondary | ICD-10-CM | POA: Diagnosis present

## 2023-02-16 DIAGNOSIS — I639 Cerebral infarction, unspecified: Secondary | ICD-10-CM | POA: Insufficient documentation

## 2023-02-16 DIAGNOSIS — W010XXA Fall on same level from slipping, tripping and stumbling without subsequent striking against object, initial encounter: Secondary | ICD-10-CM | POA: Diagnosis present

## 2023-02-16 LAB — COMPREHENSIVE METABOLIC PANEL
ALT: 29 U/L (ref 0–44)
AST: 28 U/L (ref 15–41)
Albumin: 4 g/dL (ref 3.5–5.0)
Alkaline Phosphatase: 110 U/L (ref 38–126)
Anion gap: 8 (ref 5–15)
BUN: 19 mg/dL (ref 8–23)
CO2: 29 mmol/L (ref 22–32)
Calcium: 9.2 mg/dL (ref 8.9–10.3)
Chloride: 95 mmol/L — ABNORMAL LOW (ref 98–111)
Creatinine, Ser: 0.8 mg/dL (ref 0.61–1.24)
GFR, Estimated: >60 mL/min (ref >60)
Glucose, Bld: 348 mg/dL — ABNORMAL HIGH (ref 70–99)
Potassium: 4.2 mmol/L (ref 3.5–5.1)
Sodium: 132 mmol/L — ABNORMAL LOW (ref 135–145)
Total Bilirubin: 1 mg/dL (ref 0.3–1.2)
Total Protein: 8.3 g/dL — ABNORMAL HIGH (ref 6.5–8.1)

## 2023-02-16 LAB — CBC WITH DIFFERENTIAL/PLATELET
Abs Immature Granulocytes: 0.11 10*3/uL — ABNORMAL HIGH (ref 0.00–0.07)
Basophils Absolute: 0.1 10*3/uL (ref 0.0–0.1)
Basophils Relative: 0 %
Eosinophils Absolute: 0 10*3/uL (ref 0.0–0.5)
Eosinophils Relative: 0 %
HCT: 43.9 % (ref 39.0–52.0)
Hemoglobin: 15.5 g/dL (ref 13.0–17.0)
Immature Granulocytes: 1 %
Lymphocytes Relative: 12 %
Lymphs Abs: 1.7 10*3/uL (ref 0.7–4.0)
MCH: 30.5 pg (ref 26.0–34.0)
MCHC: 35.3 g/dL (ref 30.0–36.0)
MCV: 86.4 fL (ref 80.0–100.0)
Monocytes Absolute: 1.5 10*3/uL — ABNORMAL HIGH (ref 0.1–1.0)
Monocytes Relative: 11 %
Neutro Abs: 11 10*3/uL — ABNORMAL HIGH (ref 1.7–7.7)
Neutrophils Relative %: 76 %
Platelets: 303 10*3/uL (ref 150–400)
RBC: 5.08 MIL/uL (ref 4.22–5.81)
RDW: 12 % (ref 11.5–15.5)
WBC: 14.5 10*3/uL — ABNORMAL HIGH (ref 4.0–10.5)
nRBC: 0 % (ref 0.0–0.2)

## 2023-02-16 LAB — TROPONIN I (HIGH SENSITIVITY): Troponin I (High Sensitivity): 7 ng/L (ref <18)

## 2023-02-16 MED ORDER — INSULIN DETEMIR 100 UNIT/ML ~~LOC~~ SOLN
50.0000 [IU] | Freq: Two times a day (BID) | SUBCUTANEOUS | Status: DC
  Administered 2023-02-17 (×2): 50 [IU] via SUBCUTANEOUS
  Filled 2023-02-16 (×3): qty 0.5

## 2023-02-16 MED ORDER — INSULIN ASPART 100 UNIT/ML IJ SOLN
0.0000 [IU] | Freq: Three times a day (TID) | INTRAMUSCULAR | Status: DC
  Filled 2023-02-16: qty 1

## 2023-02-16 MED ORDER — LOSARTAN POTASSIUM-HCTZ 100-25 MG PO TABS: 1.0000 | ORAL_TABLET | Freq: Every day | ORAL | Status: DC

## 2023-02-16 MED ORDER — METFORMIN HCL 500 MG PO TABS
1000.0000 mg | ORAL_TABLET | Freq: Two times a day (BID) | ORAL | Status: DC
  Administered 2023-02-17: 1000 mg via ORAL
  Filled 2023-02-16 (×2): qty 2

## 2023-02-16 MED ORDER — SODIUM CHLORIDE 0.9 % IV BOLUS
1000.0000 mL | Freq: Once | INTRAVENOUS | Status: AC
  Administered 2023-02-16: 1000 mL via INTRAVENOUS

## 2023-02-16 MED ORDER — HEPARIN SODIUM (PORCINE) 5000 UNIT/ML IJ SOLN
5000.0000 [IU] | Freq: Three times a day (TID) | INTRAMUSCULAR | Status: DC
  Administered 2023-02-17: 5000 [IU] via SUBCUTANEOUS
  Filled 2023-02-16: qty 1

## 2023-02-16 MED ORDER — ACETAMINOPHEN 325 MG PO TABS
650.0000 mg | ORAL_TABLET | Freq: Once | ORAL | Status: AC
  Administered 2023-02-16: 650 mg via ORAL
  Filled 2023-02-16: qty 2

## 2023-02-16 MED ORDER — GABAPENTIN 300 MG PO CAPS
600.0000 mg | ORAL_CAPSULE | Freq: Every day | ORAL | Status: DC
  Administered 2023-02-17 – 2023-02-19 (×4): 600 mg via ORAL
  Filled 2023-02-16 (×4): qty 2

## 2023-02-16 MED ORDER — ROSUVASTATIN CALCIUM 10 MG PO TABS
10.0000 mg | ORAL_TABLET | Freq: Every day | ORAL | Status: DC
  Administered 2023-02-17 (×2): 10 mg via ORAL
  Filled 2023-02-16 (×2): qty 1

## 2023-02-16 MED ORDER — LIRAGLUTIDE 18 MG/3ML ~~LOC~~ SOPN: 1.8000 mg | PEN_INJECTOR | Freq: Every day | SUBCUTANEOUS | Status: DC

## 2023-02-16 MED ORDER — MORPHINE SULFATE (PF) 2 MG/ML IV SOLN
0.5000 mg | INTRAVENOUS | Status: DC | PRN
  Administered 2023-02-17: 0.5 mg via INTRAVENOUS
  Filled 2023-02-16: qty 1

## 2023-02-16 MED ORDER — INSULIN ASPART 100 UNIT/ML IJ SOLN
3.0000 [IU] | Freq: Three times a day (TID) | INTRAMUSCULAR | Status: DC
  Administered 2023-02-17: 3 [IU] via SUBCUTANEOUS
  Filled 2023-02-16: qty 1

## 2023-02-16 MED ORDER — VERAPAMIL HCL ER 240 MG PO TBCR
240.0000 mg | EXTENDED_RELEASE_TABLET | Freq: Every day | ORAL | Status: DC
  Administered 2023-02-17: 240 mg via ORAL
  Filled 2023-02-16 (×2): qty 1

## 2023-02-16 MED ORDER — MORPHINE SULFATE (PF) 4 MG/ML IV SOLN
4.0000 mg | Freq: Once | INTRAVENOUS | Status: AC
  Administered 2023-02-16: 4 mg via INTRAVENOUS
  Filled 2023-02-16: qty 1

## 2023-02-16 MED ORDER — ONDANSETRON 4 MG PO TBDP: 4.0000 mg | ORAL_TABLET | Freq: Three times a day (TID) | ORAL | Status: DC | PRN

## 2023-02-16 MED ORDER — ONDANSETRON HCL 4 MG/2ML IJ SOLN
4.0000 mg | Freq: Once | INTRAMUSCULAR | Status: AC
  Administered 2023-02-16: 4 mg via INTRAVENOUS
  Filled 2023-02-16: qty 2

## 2023-02-16 MED ORDER — HYDROCODONE-ACETAMINOPHEN 5-325 MG PO TABS: 1.0000 | ORAL_TABLET | Freq: Four times a day (QID) | ORAL | Status: DC | PRN

## 2023-02-16 NOTE — ED Triage Notes (Signed)
Pt comes via EMS from home with c/o fall. Pt fell in yard and crawled into house.. Pt states left hip pain. 160/100 94% RA 87 CBG 385 after fluids given. Pt has not had insulin today.

## 2023-02-16 NOTE — Discharge Instructions (Signed)
Follow-up with your regular doctor as needed.  Return emergency department worsening.  Take all of your regular medicines as prescribed.  Your glucose was a little high here in the ED.  Sure to monitor your glucose, closely.  If you develop chest pain/shortness of breath please return immediately

## 2023-02-16 NOTE — ED Notes (Signed)
Admitting MD at bedside.

## 2023-02-16 NOTE — ED Provider Notes (Cosign Needed Addendum)
[facility] Provider Note    Event Date/Time   First MD Initiated Contact with Patient 02/16/23 1536     (approximate)   History   Fall   HPI  [name] is a 72 y.o. male with history of diabetes, hypertension, CVA, hyperlipidemia, CAD, MI presents emergency department with concerns of left hip pain along with chest pain after a fall.  Patient states he was walking in the yard, cannot feel his feet due to neuropathy, and fell landing face first.  Denies shortness of breath.  States he does not feel more weak than normal.  Unsure of head injury      Physical Exam   Triage Vital Signs: ED Triage Vitals  Enc Vitals Group     BP 02/16/23 1427 (!) 153/91     Pulse Rate 02/16/23 1427 (!) 103     Resp 02/16/23 1427 20     Temp 02/16/23 1427 97.9 F (36.6 C)     Temp Source 02/16/23 1427 Oral     SpO2 02/16/23 1427 99 %     Weight --      Height --      Head Circumference --      Peak Flow --      Pain Score 02/16/23 1347 7     Pain Loc --      Pain Edu? --      Excl. in GC? --     Most recent vital signs: Vitals:   02/16/23 1922 02/16/23 2203  BP: (!) 150/77 137/85  Pulse: (!) 109 98  Resp: 20 20  Temp: 97.8 F (36.6 C) 97.9 F (36.6 C)  SpO2: 91% 92%     General: Awake, no distress.   CV:  Good peripheral perfusion. regular rate and  rhythm, chest is tender along the sternum Resp:  Normal effort. Lungs cta Abd:  No distention.  Nontender Other:  Left hip is tender to palpation, pain reproduced on range of motion, sternum is very tender to palpation   ED Results / Procedures / Treatments   Labs (all labs ordered are listed, but only abnormal results are displayed) Labs Reviewed  COMPREHENSIVE METABOLIC PANEL - Abnormal; Notable for the following components:      Result Value   Sodium 132 (*)    Chloride 95 (*)    Glucose, Bld 348 (*)    Total Protein 8.3 (*)    All other components within normal limits  CBC WITH  DIFFERENTIAL/PLATELET - Abnormal; Notable for the following components:   WBC 14.5 (*)    Neutro Abs 11.0 (*)    Monocytes Absolute 1.5 (*)    Abs Immature Granulocytes 0.11 (*)    All other components within normal limits  CBG MONITORING, ED  TROPONIN I (HIGH SENSITIVITY)     EKG  EKG   RADIOLOGY Left hip x-ray, chest x-ray    PROCEDURES:   Procedures   MEDICATIONS ORDERED IN ED: Medications  sodium chloride 0.9 % bolus 1,000 mL (0 mLs Intravenous Stopped 02/16/23 1830)  acetaminophen (TYLENOL) tablet 650 mg (650 mg Oral Given 02/16/23 1921)  morphine (PF) 4 MG/ML injection 4 mg (4 mg Intravenous Given 02/16/23 2208)  ondansetron (ZOFRAN) injection 4 mg (4 mg Intravenous Given 02/16/23 2208)     IMPRESSION / MDM / ASSESSMENT AND PLAN / ED COURSE  I reviewed the triage vital signs and the nursing notes.  Differential diagnosis includes, but is not limited to, cardiac contusion, left hip fracture, MI, subdural, fracture  Patient's presentation is most consistent with acute presentation with potential threat to life or bodily function.   Due to the patient being very tender along the chest will do labs and imaging.  Chest x-ray was independently reviewed and interpreted by me as being negative for any acute abnormality  X-ray of the left hip was independently reviewed and interpreted by me as being negative for any acute abnormality  I did order CT of the head secondary to the fall patient is on aspirin a day, CT of the chest without contrast due to cardiac contusion   Labs are reassuring other the patient's glucose is elevated at 348, this will be decreased with the normal saline IV,  CT of the head independently reviewed and interpreted by me as being negative for any acute abnormality  CT of the chest was independently reviewed and interpreted by me as being negative for any acute abnormality  I did explain the findings to the  patient and his family member.  If he develops chest pain or shortness of breath he is to return emergency department immediately.  Nursing staff instructed to make sure he can stand prior to discharge.  Follow-up with his regular doctor as needed.  Patient was unable to stand at time of discharge secondary discomfort.  Will get a CT of the left hip to assess for fracture not seen on x-ray.  ----------------------------------------- 8:47 PM on 02/16/2023 ----------------------------------------- CT does show a greater trochanter fracture which is minimally displaced.  Consult to orthopedics, Dr. Joice Lofts states nonsurgical.  Will consult on the for tomorrow as patient is not able to bear weight and will be admitted.  Consult to hospitalist for admission due to inability to bear weight   ----------------------------------------- 10:12 PM on 02/16/2023 ----------------------------------------- Spoke with Dr. Maisie Fus, she will be admitting the patient.  He is hemodynamically stable at admission  FINAL CLINICAL IMPRESSION(S) / ED DIAGNOSES   Final diagnoses:  Fall, initial encounter  Blunt trauma to chest, initial encounter  Left hip pain     Rx / DC Orders   ED Discharge Orders     None        Note:  This document was prepared using Dragon voice recognition software and may include unintentional dictation errors.    Faythe Ghee, PA-C 02/16/23 Mariea Clonts, MD 02/16/23 [name]    Faythe Ghee, PA-C 02/16/23 2212    Jene Every, MD 02/18/23 [phone]

## 2023-02-16 NOTE — H&P (Incomplete)
History and Physical    [name] UEA:[id number] DOB: September 30, 1951 DOA: 02/16/2023  PCP: Duane Pounds, PA  Patient coming from: home  I have personally briefly reviewed patient's old medical records in [facility]  Chief Complaint: fall with left leg pain  HPI: [name] is a 72 y.o. male with medical history significant of  DMII , diabetic neuropathy, hx of CVA,hypertension,CAD s/p remote MI, Right Carotida stenosis Who presents with multiple falls the last resulting in left hip pain and inability to stand. Patient states he was walking home from the store as he usually does each morning and note that his legs gave way multiple time while walking home. Patient states he completed this walk the day before with out any issues.. He states that it feels as if his  left leg is going to fast for his right. He notes when he fell his also his is chest. He note no following sob, but noted pain in breathing. He states he believes he felt due to severe neuropathy from his diabetes as he had fallen in the past and has difficulty feeling the ground beneath him. Currently s/p medication in ED his pain is well controlled .   ED Course:  Patient noted to have left greater trochanter fracture without significant displacement.  After CT was completed due to patient inability to bear weight despite negative xrays.  Dr [name] was called who noted fracture did not require surgical treatment. He recommend admit for PT and pain control.  Afeb, bp 153/91, hr 103, rr 20 sat 99%  YNW:GNFAOZHYQM: Chronic accentuation of bibasilar markings.   No acute abnormalities.  Labs:  Wbc 14.5, hgb 15.5,  plt 303  Na 132, K 4.2, cl 95, bicarb 29,  cr 0.80   CE3,7  EKG: Sinus tachycardia , ? Brief flutter waves  CTH: IMPRESSION: 1. No acute intracranial abnormality. 2. Cerebral atrophy and presumed chronic microvascular ischemic changes of the white matter, unchanged.   CTA IMPRESSION: 1. No  acute abnormality of the chest. 2. Mild bibasilar fibrotic changes with interlobular septal thickening. No evidence of pneumonia or pulmonary edema. 3. Mild cardiomegaly. 4. Prominent coronary artery atherosclerotic calcifications. 5. Main pulmonary trunk is dilated measuring up to 3.7 cm concerning for pulmonary arterial hypertension. 6. Aortic atherosclerosis.   CT pelvis IMPRESSION: Left greater trochanter fracture without significant displacement. No intratrochanteric or femoral neck involvement is noted.      Tx: 1L N s tylenol,morphine 4mg  , zofran  8 units novolog,  Review of Systems: As per HPI otherwise 10 point review of systems negative.   Past Medical History:  Diagnosis Date   Acute cerebral infarction (HCC) 11/2005   right caudate internal capsule secondary to small vessel   CAD (coronary artery disease)    s/p MI in distant past (1995?); TTE 2010 - EF 55-60%   Chronic kidney disease    per pt, had kidney disease 75-29 years old   Diabetes mellitus without complication (HCC)    Hyperlipidemia    Hypertension    fluctuates   Lacunar infarction (HCC) 2006/2007   Right basal ganglion, chronic lacunar infarct   Myocardial infarction [facility])    ?1995   SBO (small bowel obstruction) (HCC) 09/2010   Resolved with conservative measures/ per pt has had 2 SBO!   Stenosis of right carotid artery    Stroke [facility]) 2010   TIA (transient ischemic attack) 06/2009    Past Surgical History:  Procedure Laterality  Date   broken collarbone     right collarbone   broken shoulder     right shoulder   ESOPHAGOGASTRODUODENOSCOPY N/A 12/21/2019   Procedure: ESOPHAGOGASTRODUODENOSCOPY (EGD);  Surgeon: [name], Duane Nearing, MD;  Location: [facility] ENDOSCOPY;  Service: Gastroenterology;  Laterality: N/A;   FINGER SURGERY     right hand, tip of finger mashed and broken fingers   LAPAROSCOPIC CHOLECYSTECTOMY  2009   by Dr. Derrell [name]     reports that he has never smoked. He has never used  smokeless tobacco. He reports that he does not drink alcohol and does not use drugs.  Allergies  Allergen Reactions   Bee Venom Anaphylaxis   Penicillins Anaphylaxis    Family History  Problem Relation Age of Onset   Lung cancer Mother    Emphysema Mother    Heart failure Father    Emphysema Sister     Prior to Admission medications   Medication Sig Start Date End Date Taking? Authorizing Provider  aspirin EC 81 MG tablet Take 81 mg by mouth daily. For pain    [provider]  BD PEN NEEDLE MICRO U/F 32G X 6 MM MISC Use pen needles for insulin injection each evening 10/09/17   [provider]  gabapentin (NEURONTIN) 600 MG tablet Take 600-1,200 mg by mouth 2 (two) times daily. Take 1 tablet by mouth in the morning and 2 tablets in the evening 09/16/19   [provider]  LEVEMIR FLEXTOUCH 100 UNIT/ML Pen 48 UNITS IN THE MORNING AND 20 MINUTES AT NIGHT 10/06/17   [provider]  losartan-hydrochlorothiazide (HYZAAR) 100-25 MG tablet Take 1 tablet by mouth daily. 09/15/19   [provider]  meloxicam (MOBIC) 15 MG tablet Take 15 mg by mouth daily. 10/09/17   [provider]  metFORMIN (GLUCOPHAGE) 1000 MG tablet Take 1,000 mg by mouth 2 (two) times daily. 10/09/17   [provider]  ondansetron (ZOFRAN-ODT) 4 MG disintegrating tablet Take 1 tablet (4 mg total) by mouth every 8 (eight) hours as needed for nausea or vomiting. 02/04/23   Duane Antis, MD  rosuvastatin (CRESTOR) 10 MG tablet Take 10 mg by mouth at bedtime. 09/15/19   [provider]  sildenafil (REVATIO) 20 MG tablet TAKE 1 TABLET BY MOUTH prior TO sexual activity prn 08/31/17   [provider]  verapamil (VERELAN PM) 240 MG 24 hr capsule Take 240 mg by mouth daily.  10/09/17   [provider]    Physical Exam: Vitals:   02/16/23 1427 02/16/23 1724 02/16/23 1922 02/16/23 2203  BP: (!) 153/91 134/71 (!) 150/77 137/85  Pulse: (!)  103 100 (!) 109 98  Resp: 20 19 20 20   Temp: 97.9 F (36.6 C)  97.8 F (36.6 C) 97.9 F (36.6 C)  TempSrc: Oral  Oral Oral  SpO2: 99% 95% 91% 92%  Weight:    64.4 kg  Height:    5\' 4"  (1.626 m)    Constitutional: NAD, calm, comfortable Vitals:   02/16/23 1427 02/16/23 1724 02/16/23 1922 02/16/23 2203  BP: (!) 153/91 134/71 (!) 150/77 137/85  Pulse: (!) 103 100 (!) 109 98  Resp: 20 19 20 20   Temp: 97.9 F (36.6 C)  97.8 F (36.6 C) 97.9 F (36.6 C)  TempSrc: Oral  Oral Oral  SpO2: 99% 95% 91% 92%  Weight:    64.4 kg  Height:    5\' 4"  (1.626 m)   Eyes: PERRL, lids and conjunctivae normal ENMT: Mucous membranes are  moist. Posterior pharynx clear of any exudate or lesions.Normal dentition.  Neck: normal, supple, no masses, no thyromegaly Respiratory: clear to auscultation bilaterally, no wheezing, no crackles. Normal respiratory effort. No accessory muscle use.  Cardiovascular: Regular rate and rhythm, no murmurs / rubs / gallops. No extremity edema. 2+ pedal pulses.  Abdomen: no tenderness, no masses palpated. No hepatosplenomegaly. Bowel sounds positive.  Musculoskeletal: no clubbing / cyanosis. No joint deformity upper and lower extremities. Good ROM, no contractures. Normal muscle tone. Pain with ROM on active motion only  Skin: no rashes, lesions, ulcers. No induration Neurologic: CN 2-12 grossly intact. Sensation intact, DTR normal. Strength 5/5 in all 4 except left due to pain Psychiatric: Normal judgment and insight. Alert and oriented x 3. Normal mood.    Labs on Admission: I have personally reviewed following labs and imaging studies  CBC: Recent Labs  Lab 02/16/23 1709  WBC 14.5*  NEUTROABS 11.0*  HGB 15.5  HCT 43.9  MCV 86.4  PLT 303   Basic Metabolic Panel: Recent Labs  Lab 02/16/23 1709  NA 132*  K 4.2  CL 95*  CO2 29  GLUCOSE 348*  BUN 19  CREATININE 0.80  CALCIUM 9.2   GFR: Estimated Creatinine Clearance: 73 mL/min (by C-G formula based on  SCr of 0.8 mg/dL). Liver Function Tests: Recent Labs  Lab 02/16/23 1709  AST 28  ALT 29  ALKPHOS 110  BILITOT 1.0  PROT 8.3*  ALBUMIN 4.0   No results for input(s): "LIPASE", "AMYLASE" in the last 168 hours. No results for input(s): "AMMONIA" in the last 168 hours. Coagulation Profile: No results for input(s): "INR", "PROTIME" in the last 168 hours. Cardiac Enzymes: No results for input(s): "CKTOTAL", "CKMB", "CKMBINDEX", "TROPONINI" in the last 168 hours. BNP (last 3 results) No results for input(s): "PROBNP" in the last 8760 hours. HbA1C: No results for input(s): "HGBA1C" in the last 72 hours. CBG: No results for input(s): "GLUCAP" in the last 168 hours. Lipid Profile: No results for input(s): "CHOL", "HDL", "LDLCALC", "TRIG", "CHOLHDL", "LDLDIRECT" in the last 72 hours. Thyroid Function Tests: No results for input(s): "TSH", "T4TOTAL", "FREET4", "T3FREE", "THYROIDAB" in the last 72 hours. Anemia Panel: No results for input(s): "VITAMINB12", "FOLATE", "FERRITIN", "TIBC", "IRON", "RETICCTPCT" in the last 72 hours. Urine analysis:    Component Value Date/Time   COLORURINE YELLOW (A) 02/04/2023 1852   APPEARANCEUR CLEAR (A) 02/04/2023 1852   LABSPEC 1.020 02/04/2023 1852   PHURINE 5.0 02/04/2023 1852   GLUCOSEU >=500 (A) 02/04/2023 1852   HGBUR NEGATIVE 02/04/2023 1852   BILIRUBINUR NEGATIVE 02/04/2023 1852   KETONESUR NEGATIVE 02/04/2023 1852   PROTEINUR 100 (A) 02/04/2023 1852   UROBILINOGEN 1.0 06/07/2013 2149   NITRITE NEGATIVE 02/04/2023 1852   LEUKOCYTESUR NEGATIVE 02/04/2023 1852    Radiological Exams on Admission: CT PELVIS WO CONTRAST  Result Date: 02/16/2023 CLINICAL DATA:  Recent fall with left hip pain and unremarkable x-rays, initial encounter EXAM: CT PELVIS WITHOUT CONTRAST TECHNIQUE: Multidetector CT imaging of the pelvis was performed following the standard protocol without intravenous contrast. RADIATION DOSE REDUCTION: This exam was performed  according to the departmental dose-optimization program which includes automated exposure control, adjustment of the mA and/or kV according to patient size and/or use of iterative reconstruction technique. COMPARISON:  Plain film from earlier in the same day. FINDINGS: Urinary Tract:  Bladder is well distended. Bowel:  Visualized bowel shows no obstructive changes. Vascular/Lymphatic: Vascular calcifications are seen without aneurysmal dilatation. Reproductive:  Prostate is within normal limits. Other:  No free pelvic fluid is seen. Musculoskeletal: Bony structures show a an undisplaced left greater trochanter fracture without completion into the intratrochanteric region. Femoral neck appears intact. Associated soft tissue swelling is noted. No other bony abnormality is seen. IMPRESSION: Left greater trochanter fracture without significant displacement. No intratrochanteric or femoral neck involvement is noted. Electronically Signed   By: Alcide Clever M.D.   On: 02/16/2023 19:56   CT CHEST WO CONTRAST  Result Date: 02/16/2023 CLINICAL DATA:  Chest trauma, fall.  Cardiac injury suspected. EXAM: CT CHEST WITHOUT CONTRAST TECHNIQUE: Multidetector CT imaging of the chest was performed following the standard protocol without IV contrast. RADIATION DOSE REDUCTION: This exam was performed according to the departmental dose-optimization program which includes automated exposure control, adjustment of the mA and/or kV according to patient size and/or use of iterative reconstruction technique. COMPARISON:  None Available. FINDINGS: Cardiovascular: The heart is mildly enlarged. Prominent coronary artery atherosclerotic calcifications. Main pulmonary trunk is dilated measuring up to 3.7 cm concerning for pulmonary arterial hypertension. Atherosclerotic calcification of the aortic arch, aorta is however normal in caliber. Mediastinum/Nodes: No enlarged mediastinal or axillary lymph nodes. Thyroid gland, trachea, and  esophagus demonstrate no significant findings. Lungs/Pleura: Mild bibasilar fibrotic changes with interlobular septal thickening. No evidence of pneumonia or pulmonary edema. No pleural effusion or pneumothorax. Upper Abdomen: No acute abnormality. Musculoskeletal: No fracture is seen. IMPRESSION: 1. No acute abnormality of the chest. 2. Mild bibasilar fibrotic changes with interlobular septal thickening. No evidence of pneumonia or pulmonary edema. 3. Mild cardiomegaly. 4. Prominent coronary artery atherosclerotic calcifications. 5. Main pulmonary trunk is dilated measuring up to 3.7 cm concerning for pulmonary arterial hypertension. 6. Aortic atherosclerosis. Electronically Signed   By: Larose Hires D.O.   On: 02/16/2023 17:21   CT HEAD WO CONTRAST ( )  Result Date: 02/16/2023 CLINICAL DATA:  Head trauma.  Moderate-to-severe. EXAM: CT HEAD WITHOUT CONTRAST TECHNIQUE: Contiguous axial images were obtained from the base of the skull through the vertex without intravenous contrast. RADIATION DOSE REDUCTION: This exam was performed according to the departmental dose-optimization program which includes automated exposure control, adjustment of the mA and/or kV according to patient size and/or use of iterative reconstruction technique. COMPARISON:  CT head dated February 04, 2023 FINDINGS: Brain: No evidence of acute infarction, hemorrhage, hydrocephalus, extra-axial collection or mass lesion/mass effect. Cerebral atrophy and presumed chronic microvascular ischemic changes of the white matter, unchanged. Vascular: No hyperdense vessel or unexpected calcification. Skull: Normal. Negative for fracture or focal lesion. Sinuses/Orbits: No acute finding. Other: None. IMPRESSION: 1. No acute intracranial abnormality. 2. Cerebral atrophy and presumed chronic microvascular ischemic changes of the white matter, unchanged. Electronically Signed   By: Larose Hires D.O.   On: 02/16/2023 17:14   DG Chest 2 View  Result Date:  02/16/2023 CLINICAL DATA:  Pain, fell in yard and crawled into house, midsternal pain, LEFT hip pain EXAM: CHEST - 2 VIEW COMPARISON:  02/04/2023 FINDINGS: Normal heart size, mediastinal contours, and pulmonary vascularity. Chronic accentuation of bibasilar markings stable. No acute infiltrate, pleural effusion, or pneumothorax. Diffuse osseous demineralization. No definite fractures identified. IMPRESSION: Chronic accentuation of bibasilar markings. No acute abnormalities. Electronically Signed   By: Ulyses Southward M.D.   On: 02/16/2023 16:08   DG Hip Unilat With Pelvis 2-3 Views Left  Result Date: 02/16/2023 CLINICAL DATA:  Trauma, fall EXAM: DG HIP (WITH OR WITHOUT PELVIS) 2-3V LEFT COMPARISON:  None Available. FINDINGS: No displaced fracture or dislocation is seen. Joint spaces in both hips  appear symmetrical. There is possible disc space narrowing at the L5-S1 level in lumbar spine. IMPRESSION: No fracture or dislocation is seen. Electronically Signed   By: Ernie Avena M.D.   On: 02/16/2023 14:26    EKG: Independently reviewed.   Assessment/Plan    Left Greater Trochanteric fracture  - no need for surgical repair  - plan admit for med tele for supportive care of pain and expedite PT referral    Fall  -presumed mechanical  -patient w/o presyncope  -states right leg slower than left  -notes no back pain  -will obtain mri brain to be complete  Hx of CVA in the past  Right Carotid a stenosis -due to presentation -acute change in mobility  -will order mri to be complete  - continue on secondary ppx   DMII, uncontrolled  diabetic neuropath '-follows with endocrine outpatient  -iss fs   Hypertension -elevated bp on admission , currently stable  -resume home regimen   CAD s/p remote MI -resume home regimen as able        DVT prophylaxis: heparin Code Status: full/ as discussed per patient wishes in event of cardiac arrest  Family Communication: none at  bedsdie Disposition Plan:patient  expected to be admitted greater than 2 midnights  Consults called: Dr Harvie Junior  Admission status: med tele   Lurline Del MD [facility]  If 7PM-7AM, please contact night-coverage [email or web address] Password [facility]  02/16/2023, 10:12 PM

## 2023-02-16 NOTE — ED Notes (Signed)
Pt to CT

## 2023-02-16 NOTE — ED Notes (Signed)
Darl Pikes, PA provided pt with a [place] tray sandwich.

## 2023-02-17 ENCOUNTER — Inpatient Hospital Stay (HOSPITAL_COMMUNITY)
Admit: 2023-02-17 | Discharge: 2023-02-17 | Disposition: A | Discharge status: Home / Self Care | Payer: Medicare Other | Attending: Internal Medicine | Admitting: Internal Medicine

## 2023-02-17 ENCOUNTER — Encounter: Payer: Self-pay | Admitting: Radiology

## 2023-02-17 ENCOUNTER — Inpatient Hospital Stay: Payer: Medicare Other

## 2023-02-17 DIAGNOSIS — I639 Cerebral infarction, unspecified: Secondary | ICD-10-CM

## 2023-02-17 DIAGNOSIS — I2489 Other forms of acute ischemic heart disease: Secondary | ICD-10-CM

## 2023-02-17 DIAGNOSIS — S72002A Fracture of unspecified part of neck of left femur, initial encounter for closed fracture: Secondary | ICD-10-CM

## 2023-02-17 DIAGNOSIS — W19XXXA Unspecified fall, initial encounter: Secondary | ICD-10-CM

## 2023-02-17 LAB — BASIC METABOLIC PANEL
Anion gap: 5 (ref 5–15)
BUN: 21 mg/dL (ref 8–23)
CO2: 31 mmol/L (ref 22–32)
Calcium: 8.4 mg/dL — ABNORMAL LOW (ref 8.9–10.3)
Chloride: 96 mmol/L — ABNORMAL LOW (ref 98–111)
Creatinine, Ser: 0.86 mg/dL (ref 0.61–1.24)
GFR, Estimated: >60 mL/min (ref >60)
Glucose, Bld: 417 mg/dL — ABNORMAL HIGH (ref 70–99)
Potassium: 4.4 mmol/L (ref 3.5–5.1)
Sodium: 132 mmol/L — ABNORMAL LOW (ref 135–145)

## 2023-02-17 LAB — CBC
HCT: 42 % (ref 39.0–52.0)
Hemoglobin: 14.5 g/dL (ref 13.0–17.0)
MCH: 30.9 pg (ref 26.0–34.0)
MCHC: 34.5 g/dL (ref 30.0–36.0)
MCV: 89.6 fL (ref 80.0–100.0)
Platelets: 262 10*3/uL (ref 150–400)
RBC: 4.69 MIL/uL (ref 4.22–5.81)
RDW: 12.3 % (ref 11.5–15.5)
WBC: 9.5 10*3/uL (ref 4.0–10.5)
nRBC: 0 % (ref 0.0–0.2)

## 2023-02-17 LAB — CBG MONITORING, ED
Glucose-Capillary: 427 mg/dL — ABNORMAL HIGH (ref 70–99)
Glucose-Capillary: 394 mg/dL — ABNORMAL HIGH (ref 70–99)
Glucose-Capillary: 261 mg/dL — ABNORMAL HIGH (ref 70–99)

## 2023-02-17 LAB — LIPASE, BLOOD: Lipase: 23 U/L (ref 11–51)

## 2023-02-17 LAB — ECHOCARDIOGRAM COMPLETE
Area-P 1/2: 3.27 cm2
Height: 64 in
S' Lateral: 2.8 cm
Weight: 2272 oz

## 2023-02-17 LAB — HEPATIC FUNCTION PANEL
ALT: 42 U/L (ref 0–44)
AST: 49 U/L — ABNORMAL HIGH (ref 15–41)
Albumin: 3.4 g/dL — ABNORMAL LOW (ref 3.5–5.0)
Alkaline Phosphatase: 110 U/L (ref 38–126)
Bilirubin, Direct: 0.4 mg/dL — ABNORMAL HIGH (ref 0.0–0.2)
Indirect Bilirubin: 1.4 mg/dL — ABNORMAL HIGH (ref 0.3–0.9)
Total Bilirubin: 1.8 mg/dL — ABNORMAL HIGH (ref 0.3–1.2)
Total Protein: 7.3 g/dL (ref 6.5–8.1)

## 2023-02-17 LAB — GLUCOSE, CAPILLARY
Glucose-Capillary: 77 mg/dL (ref 70–99)
Glucose-Capillary: 260 mg/dL — ABNORMAL HIGH (ref 70–99)

## 2023-02-17 LAB — HEMOGLOBIN A1C
Hgb A1c MFr Bld: 13.1 % — ABNORMAL HIGH (ref 4.8–5.6)
Mean Plasma Glucose: 329.27 mg/dL

## 2023-02-17 LAB — VITAMIN D 25 HYDROXY (VIT D DEFICIENCY, FRACTURES): Vit D, 25-Hydroxy: 9.55 ng/mL — ABNORMAL LOW (ref 30–100)

## 2023-02-17 LAB — HIV ANTIBODY (ROUTINE TESTING W REFLEX): HIV Screen 4th Generation wRfx: NONREACTIVE

## 2023-02-17 MED ORDER — INSULIN ASPART 100 UNIT/ML IJ SOLN
0.0000 [IU] | Freq: Every day | INTRAMUSCULAR | Status: DC
  Administered 2023-02-17: 3 [IU] via SUBCUTANEOUS
  Administered 2023-02-18: 2 [IU] via SUBCUTANEOUS
  Filled 2023-02-17 (×2): qty 1

## 2023-02-17 MED ORDER — INSULIN DETEMIR 100 UNIT/ML ~~LOC~~ SOLN
55.0000 [IU] | Freq: Two times a day (BID) | SUBCUTANEOUS | Status: DC
  Administered 2023-02-17: 55 [IU] via SUBCUTANEOUS
  Filled 2023-02-17 (×2): qty 0.55

## 2023-02-17 MED ORDER — ENOXAPARIN SODIUM 40 MG/0.4ML IJ SOSY
40.0000 mg | PREFILLED_SYRINGE | INTRAMUSCULAR | Status: DC
  Administered 2023-02-17 – 2023-02-19 (×3): 40 mg via SUBCUTANEOUS
  Filled 2023-02-17 (×3): qty 0.4

## 2023-02-17 MED ORDER — LOSARTAN POTASSIUM 50 MG PO TABS
100.0000 mg | ORAL_TABLET | Freq: Every day | ORAL | Status: DC
  Filled 2023-02-17: qty 2

## 2023-02-17 MED ORDER — ASPIRIN 81 MG PO TBEC
81.0000 mg | DELAYED_RELEASE_TABLET | Freq: Every day | ORAL | Status: DC
  Administered 2023-02-17 – 2023-02-20 (×4): 81 mg via ORAL
  Filled 2023-02-17 (×5): qty 1

## 2023-02-17 MED ORDER — HYDROCHLOROTHIAZIDE 25 MG PO TABS
25.0000 mg | ORAL_TABLET | Freq: Every day | ORAL | Status: DC
  Filled 2023-02-17: qty 1

## 2023-02-17 MED ORDER — SODIUM CHLORIDE 0.9 % IV SOLN: INTRAVENOUS | Status: DC

## 2023-02-17 MED ORDER — HEPARIN SODIUM (PORCINE) 5000 UNIT/ML IJ SOLN: 5000.0000 [IU] | Freq: Three times a day (TID) | INTRAMUSCULAR | Status: DC

## 2023-02-17 MED ORDER — ONDANSETRON HCL 4 MG/2ML IJ SOLN
4.0000 mg | Freq: Four times a day (QID) | INTRAMUSCULAR | Status: DC | PRN
  Administered 2023-02-17 – 2023-02-19 (×2): 4 mg via INTRAVENOUS
  Filled 2023-02-17 (×2): qty 2

## 2023-02-17 MED ORDER — SODIUM CHLORIDE 0.9 % IV SOLN: 8.0000 mg | Freq: Four times a day (QID) | INTRAVENOUS | Status: DC | PRN

## 2023-02-17 MED ORDER — INSULIN ASPART 100 UNIT/ML IJ SOLN
5.0000 [IU] | Freq: Three times a day (TID) | INTRAMUSCULAR | Status: DC
  Administered 2023-02-17 – 2023-02-20 (×3): 5 [IU] via SUBCUTANEOUS
  Filled 2023-02-17 (×4): qty 1

## 2023-02-17 MED ORDER — INSULIN ASPART 100 UNIT/ML IJ SOLN
0.0000 [IU] | Freq: Three times a day (TID) | INTRAMUSCULAR | Status: DC
  Administered 2023-02-17: 20 [IU] via SUBCUTANEOUS
  Administered 2023-02-17: 11 [IU] via SUBCUTANEOUS
  Administered 2023-02-18 (×2): 7 [IU] via SUBCUTANEOUS
  Filled 2023-02-17 (×3): qty 1

## 2023-02-17 NOTE — Plan of Care (Signed)

## 2023-02-17 NOTE — Consult Note (Signed)
ORTHOPAEDIC CONSULTATION  REQUESTING PHYSICIAN: Wouk, Wilfred Curtis, MD  Chief Complaint:   Left hip pain.  History of Present Illness: [name] is a 72 y.o. male with multiple medical issues including coronary artery disease, status post MI, diabetes, chronic kidney disease, peripheral vascular disease, multiple TIAs/strokes, peripheral neuropathy, hyperlipidemia, and hypertension who normally lives independent.  The patient states that he was walking in the yard but, because of his underlying neuropathy, was unable to feel his feet or keep his balance and he fell forward landing on his flexed left knee and left side.  He presented to the emergency room complaining of left hip pain as well as chest pain and so has been admitted for further workup and pain control.  The patient denies any associated injuries, although he cannot recall whether he may have actually struck his head or not.  He denies any loss of consciousness and denies any lightheadedness, dizziness, chest pain, shortness of breath, or other symptoms which may have precipitated his fall.  Initial x-rays of his pelvis and left hip were unremarkable for fracture.  However, a subsequent CT scan of his pelvis demonstrated a nondisplaced fracture of the left greater trochanter, prompting orthopedic consultation.  Past Medical History:  Diagnosis Date   Acute cerebral infarction (HCC) 11/2005   right caudate internal capsule secondary to small vessel   CAD (coronary artery disease)    s/p MI in distant past (1995?); TTE 2010 - EF 55-60%   Chronic kidney disease    per pt, had kidney disease 34-76 years old   Diabetes mellitus without complication (HCC)    Hyperlipidemia    Hypertension    fluctuates   Lacunar infarction (HCC) 2006/2007   Right basal ganglion, chronic lacunar infarct   Myocardial infarction [facility])    ?1995   SBO (small bowel obstruction) (HCC)  09/2010   Resolved with conservative measures/ per pt has had 2 SBO!   Stenosis of right carotid artery    Stroke [facility]) 2010   TIA (transient ischemic attack) 06/2009   Past Surgical History:  Procedure Laterality Date   broken collarbone     right collarbone   broken shoulder     right shoulder   ESOPHAGOGASTRODUODENOSCOPY N/A 12/21/2019   Procedure: ESOPHAGOGASTRODUODENOSCOPY (EGD);  Surgeon: Toledo, Boykin Nearing, MD;  Location: [facility] ENDOSCOPY;  Service: Gastroenterology;  Laterality: N/A;   FINGER SURGERY     right hand, tip of finger mashed and broken fingers   LAPAROSCOPIC CHOLECYSTECTOMY  2009   by Dr. Derrell Lolling   Social History   Socioeconomic History   Marital status: Married    Spouse name: Not on file   Number of children: 4   Years of education: 12   Highest education level: Not on file  Occupational History   Occupation: Education administrator  Tobacco Use   Smoking status: Never   Smokeless tobacco: Never  Vaping Use   Vaping Use: Never used  Substance and Sexual Activity   Alcohol use: No   Drug use: No   Sexual activity: Yes    Birth control/protection: None  Other Topics Concern   Not on file  Social History Narrative   Lives with his wife.  Went to  [employer], was asked to leave 2 weeks prior to graduation   Social Determinants of Health   Financial Resource Strain: Not on file  Food Insecurity: Not on file  Transportation Needs: Not on file  Physical Activity: Not on file  Stress:  Not on file  Social Connections: Not on file   Family History  Problem Relation Age of Onset   Lung cancer Mother    Emphysema Mother    Heart failure Father    Emphysema Sister    Allergies  Allergen Reactions   Bee Venom Anaphylaxis   Penicillins Anaphylaxis   Prior to Admission medications   Medication Sig Start Date End Date Taking? Authorizing Provider  gabapentin (NEURONTIN) 600 MG tablet Take 600 mg by mouth at bedtime. 09/07/20  Yes [provider]   insulin detemir (LEVEMIR FLEXPEN) 100 UNIT/ML FlexPen Inject 50 Units into the skin 2 (two) times daily. 09/07/20  Yes [provider]  liraglutide (VICTOZA) 18 MG/3ML SOPN Inject 1.8 mg into the skin daily.   Yes [provider]  losartan-hydrochlorothiazide (HYZAAR) 100-25 MG tablet Take 1 tablet by mouth daily. 09/15/19  Yes [provider]  metFORMIN (GLUCOPHAGE) 1000 MG tablet Take 1,000 mg by mouth 2 (two) times daily. 10/09/17  Yes [provider]  ondansetron (ZOFRAN-ODT) 4 MG disintegrating tablet Take 1 tablet (4 mg total) by mouth every 8 (eight) hours as needed for nausea or vomiting. 02/04/23  Yes Minna Antis, MD  rosuvastatin (CRESTOR) 10 MG tablet Take 10 mg by mouth at bedtime. 09/15/19  Yes [provider]  verapamil (VERELAN PM) 240 MG 24 hr capsule Take 240 mg by mouth daily.  10/09/17  Yes [provider]  aspirin EC 81 MG tablet Take 81 mg by mouth daily. For pain Patient not taking: Reported on 02/16/2023    [provider]  BD PEN NEEDLE MICRO U/F 32G X 6 MM MISC Use pen needles for insulin injection each evening 10/09/17   [provider]  meloxicam (MOBIC) 15 MG tablet Take 15 mg by mouth daily. Patient not taking: Reported on 02/16/2023 10/09/17   [provider]  sildenafil (REVATIO) 20 MG tablet TAKE 1 TABLET BY MOUTH prior TO sexual activity prn 08/31/17   [provider]   DG Abd Portable 1V  Result Date: 02/17/2023 CLINICAL DATA:  New onset vomiting.  Last bowel movement 2 days ago. EXAM: PORTABLE ABDOMEN - 1 VIEW COMPARISON:  Abdominopelvic CT 12/20/2019. Abdominal radiographs 12/20/2019. FINDINGS: Two supine views of the abdomen are submitted. There is a normal nonobstructive bowel gas pattern. No supine evidence of pneumoperitoneum or bowel wall thickening. Cholecystectomy clips and scattered vascular calcifications are noted. There are mild degenerative changes in the  spine without acute osseous abnormality. IMPRESSION: No evidence of bowel obstruction or other acute process. Electronically Signed   By: Carey Bullocks M.D.   On: 02/17/2023 09:13   MR BRAIN WO CONTRAST  Result Date: 02/17/2023 CLINICAL DATA:  Motor neuron disease suspected. Headache and weakness. Fall 1 day ago. EXAM: MRI HEAD WITHOUT CONTRAST TECHNIQUE: Multiplanar, multiecho pulse sequences of the brain and surrounding structures were obtained without intravenous contrast. COMPARISON:  CT head without contrast 02/16/2023. MR head without contrast 01/07/2012 FINDINGS: Brain: The diffusion-weighted images suggest an acute nonhemorrhagic punctate infarct in the anteromedial aspect of the right cerebral peduncle. No T2 or FLAIR signal abnormality is associated with this area. This is an area of known artifact on diffusion-weighted images. No other acute infarct present. Moderate generalized atrophy and white matter disease is present. A remote lacunar infarct is noted in the white matter adjacent to the frontal horn of the left lateral ventricle, stable since 2013. Remote lacunar infarcts are also present in the right basal ganglia. No acute  hemorrhage or mass lesion is present. The ventricles are proportionate to the degree of atrophy. No significant extraaxial fluid collection is present. The brainstem and cerebellum are otherwise within normal limits. Vascular: Flow is present in the major intracranial arteries. Skull and upper cervical spine: The craniocervical junction is normal. Upper cervical spine is within normal limits. Marrow signal is unremarkable. Sinuses/Orbits: The paranasal sinuses and mastoid air cells are clear. The globes and orbits are within normal limits. IMPRESSION: 1. Possible acute nonhemorrhagic punctate infarct in the anteromedial aspect of the right cerebral peduncle. No T2 or FLAIR signal abnormality is associated. This may be artifact. 2. No other acute infarct. 3. Moderate  generalized atrophy and white matter disease likely reflects the sequela of chronic microvascular ischemia. 4. Remote lacunar infarcts of the white matter adjacent to the frontal horn of the left lateral ventricle and right basal ganglia. Electronically Signed   By: Marin Roberts M.D.   On: 02/17/2023 06:56   CT PELVIS WO CONTRAST  Result Date: 02/16/2023 CLINICAL DATA:  Recent fall with left hip pain and unremarkable x-rays, initial encounter EXAM: CT PELVIS WITHOUT CONTRAST TECHNIQUE: Multidetector CT imaging of the pelvis was performed following the standard protocol without intravenous contrast. RADIATION DOSE REDUCTION: This exam was performed according to the departmental dose-optimization program which includes automated exposure control, adjustment of the mA and/or kV according to patient size and/or use of iterative reconstruction technique. COMPARISON:  Plain film from earlier in the same day. FINDINGS: Urinary Tract:  Bladder is well distended. Bowel:  Visualized bowel shows no obstructive changes. Vascular/Lymphatic: Vascular calcifications are seen without aneurysmal dilatation. Reproductive:  Prostate is within normal limits. Other:  No free pelvic fluid is seen. Musculoskeletal: Bony structures show a an undisplaced left greater trochanter fracture without completion into the intratrochanteric region. Femoral neck appears intact. Associated soft tissue swelling is noted. No other bony abnormality is seen. IMPRESSION: Left greater trochanter fracture without significant displacement. No intratrochanteric or femoral neck involvement is noted. Electronically Signed   By: Alcide Clever M.D.   On: 02/16/2023 19:56   CT CHEST WO CONTRAST  Result Date: 02/16/2023 CLINICAL DATA:  Chest trauma, fall.  Cardiac injury suspected. EXAM: CT CHEST WITHOUT CONTRAST TECHNIQUE: Multidetector CT imaging of the chest was performed following the standard protocol without IV contrast. RADIATION DOSE  REDUCTION: This exam was performed according to the departmental dose-optimization program which includes automated exposure control, adjustment of the mA and/or kV according to patient size and/or use of iterative reconstruction technique. COMPARISON:  None Available. FINDINGS: Cardiovascular: The heart is mildly enlarged. Prominent coronary artery atherosclerotic calcifications. Main pulmonary trunk is dilated measuring up to 3.7 cm concerning for pulmonary arterial hypertension. Atherosclerotic calcification of the aortic arch, aorta is however normal in caliber. Mediastinum/Nodes: No enlarged mediastinal or axillary lymph nodes. Thyroid gland, trachea, and esophagus demonstrate no significant findings. Lungs/Pleura: Mild bibasilar fibrotic changes with interlobular septal thickening. No evidence of pneumonia or pulmonary edema. No pleural effusion or pneumothorax. Upper Abdomen: No acute abnormality. Musculoskeletal: No fracture is seen. IMPRESSION: 1. No acute abnormality of the chest. 2. Mild bibasilar fibrotic changes with interlobular septal thickening. No evidence of pneumonia or pulmonary edema. 3. Mild cardiomegaly. 4. Prominent coronary artery atherosclerotic calcifications. 5. Main pulmonary trunk is dilated measuring up to 3.7 cm concerning for pulmonary arterial hypertension. 6. Aortic atherosclerosis. Electronically Signed   By: Larose Hires D.O.   On: 02/16/2023 17:21   CT HEAD WO CONTRAST ( )  Result Date: 02/16/2023  CLINICAL DATA:  Head trauma.  Moderate-to-severe. EXAM: CT HEAD WITHOUT CONTRAST TECHNIQUE: Contiguous axial images were obtained from the base of the skull through the vertex without intravenous contrast. RADIATION DOSE REDUCTION: This exam was performed according to the departmental dose-optimization program which includes automated exposure control, adjustment of the mA and/or kV according to patient size and/or use of iterative reconstruction technique. COMPARISON:  CT head  dated February 04, 2023 FINDINGS: Brain: No evidence of acute infarction, hemorrhage, hydrocephalus, extra-axial collection or mass lesion/mass effect. Cerebral atrophy and presumed chronic microvascular ischemic changes of the white matter, unchanged. Vascular: No hyperdense vessel or unexpected calcification. Skull: Normal. Negative for fracture or focal lesion. Sinuses/Orbits: No acute finding. Other: None. IMPRESSION: 1. No acute intracranial abnormality. 2. Cerebral atrophy and presumed chronic microvascular ischemic changes of the white matter, unchanged. Electronically Signed   By: Larose Hires D.O.   On: 02/16/2023 17:14   DG Chest 2 View  Result Date: 02/16/2023 CLINICAL DATA:  Pain, fell in yard and crawled into house, midsternal pain, LEFT hip pain EXAM: CHEST - 2 VIEW COMPARISON:  02/04/2023 FINDINGS: Normal heart size, mediastinal contours, and pulmonary vascularity. Chronic accentuation of bibasilar markings stable. No acute infiltrate, pleural effusion, or pneumothorax. Diffuse osseous demineralization. No definite fractures identified. IMPRESSION: Chronic accentuation of bibasilar markings. No acute abnormalities. Electronically Signed   By: Ulyses Southward M.D.   On: 02/16/2023 16:08   DG Hip Unilat With Pelvis 2-3 Views Left  Result Date: 02/16/2023 CLINICAL DATA:  Trauma, fall EXAM: DG HIP (WITH OR WITHOUT PELVIS) 2-3V LEFT COMPARISON:  None Available. FINDINGS: No displaced fracture or dislocation is seen. Joint spaces in both hips appear symmetrical. There is possible disc space narrowing at the L5-S1 level in lumbar spine. IMPRESSION: No fracture or dislocation is seen. Electronically Signed   By: Ernie Avena M.D.   On: 02/16/2023 14:26    Positive ROS: All other systems have been reviewed and were otherwise negative with the exception of those mentioned in the HPI and as above.  Physical Exam: General:  Alert, no acute distress Psychiatric:  Patient is competent for consent with  normal mood and affect   Cardiovascular:  No pedal edema Respiratory:  No wheezing, non-labored breathing GI:  Abdomen is soft and non-tender Skin:  No lesions in the area of chief complaint Neurologic:  Sensation intact distally Lymphatic:  No axillary or cervical lymphadenopathy  Orthopedic Exam:  Orthopedic examination is limited to the left hip and lower extremity.  Skin inspection around the left hip is unremarkable.  No swelling, erythema, ecchymosis, abrasions, or other skin abnormalities are identified.  However, there is an area of contusion over the prepatellar region on the left knee.  He does experience mild as moderate tenderness to palpation directly over the greater trochanter laterally, but denies any pain anteriorly or posteriorly.  He has more significant pain with any attempted active hip flexion, but is able to hold his knee in extended position against gravity with only mild reproduction of his lateral sided left hip pain.  He also experiences mild reproduction of his lateral sided hip pain with logrolling of the left lower extremity.  He is grossly neurovascularly intact to the left foot and lower leg as he is able to dorsiflex and plantarflex his toes and ankle.  Sensation is diffusely diminished to light touch over all distributions due to his underlying neuropathy.  He has fair capillary refill to his left foot.  X-rays:  X-rays of the  pelvis and left hip, as well as a CT scan of the pelvis, are available for review and have been reviewed by myself.  These findings are as described above.  A nondisplaced fracture of the left greater trochanter is evident on the CT scan of the pelvis which does not extend into the intertrochanteric region.  No significant degenerative changes of the left hip are identified.  Assessment: Nondisplaced left greater trochanteric fracture.  Plan: The treatment options have been discussed with the patient.  The patient is reassured that this  fracture does not require surgical intervention.  He may be mobilized with physical therapy, weightbearing as tolerated on the left leg with a walker, as symptoms permit.  He may receive medication for pain as deemed appropriate medically by the hospitalist if necessary.  Most likely, this patient will require rehab placement in order for him to regain his strength and mobility prior to returning home.  Thank you for asking me to participate in the care of this most unfortunate man.  I will be happy to follow him with you.   Maryagnes Amos, MD  Beeper #:  [phone]  02/17/2023 12:50 PM

## 2023-02-17 NOTE — ED Notes (Signed)
Pt attempting to work with PT. Pt started to vomit. Approx . Pt back to bed. Pt reports nausea getting better when not moving. Unable to give more Ondansetron due to it being given at 0901.

## 2023-02-17 NOTE — Inpatient Diabetes Management (Signed)
Inpatient Diabetes Program Recommendations  AACE/ADA: New Consensus Statement on Inpatient Glycemic Control   Target Ranges:  Prepandial:   less than 140 mg/dL      Peak postprandial:   less than 180 mg/dL (1-2 hours)      Critically ill patients:  140 - 180 mg/dL    Latest Reference Range & Units 02/17/23 01:35 02/17/23 04:00 02/17/23 07:25  Glucose-Capillary 70 - 99 mg/dL 098 (H)    Levemir 50 units  Metformin 1000 mg 394 (H)  Novolog 23 units    Latest Reference Range & Units 02/16/23 17:09 02/17/23 04:03  Glucose 70 - 99 mg/dL 119 (H) 147 (H)   Review of Glycemic Control  Diabetes history: DM2 Outpatient Diabetes medications: Levemir 50 units BID, Victoza 1.8 mg daily, Metformin 1000 mg BID Current orders for Inpatient glycemic control: Levemir 50 units BID, Novolog 0-20 units TID with meals, Novolog 0-5 units QHS, Novolog 5 units TID with meals   NOTE: Noted consult for Diabetes Coordinator. Diabetes Coordinator is not on campus over the weekend but available by pager from 8am to 5pm for questions or concerns. Chart reviewed. Patient admitted 02/17/23 with left greater trochanteric fracture after fall. Per chart review, patient last seen Endocdrinology Donata Clay, [place] with [facility]) on 07/24/22 and per note patient was prescribed Levemir 50 units BID, Metformin 1000 mg BID and asked to start Amaryl 4 mg daily. No follow up visits noted in chart since then and last office visit note with Unknown Foley, PA (PCP) in the chart was 08/28/20. Initial lab glucose 348 mg/dl on 06/21/55. CBG 394 mg/dl today at 2:13 am.  Will follow along and diabetes coordinator will speak with patient on Monday 02/19/23.   Thanks, Orlando Penner, RN, MSN, CDCES Diabetes Coordinator Inpatient Diabetes Program [phone] (Team Pager from 8am to 5pm)

## 2023-02-17 NOTE — Evaluation (Signed)
Physical Therapy Evaluation Patient Details Name: [name] MRN: [id number] DOB: August 10, 1951 Today's Date: 02/17/2023  History of Present Illness  As per EMR: [name] Joswick is a 72 y.o. male with multiple medical issues including coronary artery disease, status post MI, diabetes, chronic kidney disease, peripheral vascular disease, multiple TIAs/strokes, peripheral neuropathy, hyperlipidemia, and hypertension who normally lives independent.  The patient states that he was walking in the yard but, because of his underlying neuropathy, was unable to feel his feet or keep his balance and he fell forward landing on his flexed left knee and left side.  He presented to the emergency room complaining of left hip pain as well as chest pain and so has been admitted for further workup and pain control.  The patient denies any associated injuries, although he cannot recall whether he may have actually struck his head or not.  He denies any loss of consciousness and denies any lightheadedness, dizziness, chest pain, shortness of breath, or other symptoms which may have precipitated his fall.  Initial x-rays of his pelvis and left hip were unremarkable for fracture.  However, a subsequent CT scan of his pelvis demonstrated a nondisplaced fracture of the left greater trochanter, prompting orthopedic consultation.  CT brain revealed  Possible acute nonhemorrhagic punctate infarct in the  anteromedial aspect of the right cerebral peduncle.   Clinical Impression    Pt received in stretcher agreeable to PT evaluation. Pt Vitals WNL. Pt A and O to self, situation, time. Pt verbal and provided information.  Pt Ind at household level and community level activity participation including driving. No use of AD. PT assessment revealed pt demonstrates weakness in L>R, decreased coordination, sensation to L side. Pt needed max assist to perform Supine to sit and sat on EON for 4 mins with Max assist to 1 on L side. Pt vomited  while sitting and had to be supine after vomiting with HOB elevated. PT will continue QD in acute. PT recommendation remains TBD as pt becomes medically stable.     Recommendations for follow up therapy are one component of a multi-disciplinary discharge planning process, led by the attending physician.  Recommendations may be updated based on patient status, additional functional criteria and insurance authorization.  Follow Up Recommendations       Assistance Recommended at Discharge Frequent or constant Supervision/Assistance  Patient can return home with the following  Two people to help with walking and/or transfers;Two people to help with bathing/dressing/bathroom;Assistance with cooking/housework;Assistance with feeding;Direct supervision/assist for medications management;Direct supervision/assist for financial management;Assist for transportation;Help with stairs or ramp for entrance    Equipment Recommendations Other (comment)  Recommendations for Other Services       Functional Status Assessment Patient has had a recent decline in their functional status and demonstrates the ability to make significant improvements in function in a reasonable and predictable amount of time.     Precautions / Restrictions Precautions Precautions: Fall Restrictions Weight Bearing Restrictions: Yes LLE Weight Bearing: Weight bearing as tolerated      Mobility  Bed Mobility Overal bed mobility: Needs Assistance Bed Mobility: Supine to Sit     Supine to sit: Max assist     General bed mobility comments: Lean to L side and became nauseus. sat ont eh EOB fro 4 mins with max assist of1.    Transfers Overall transfer level: Needs assistance                 General transfer comment:  unable to stand 2/2 to pt vomitted.    Ambulation/Gait: Unsafe               General Gait Details: deferred  Stairs:  unsafe            Wheelchair Mobility    Modified Rankin  (Stroke Patients Only)       Balance Overall balance assessment: Needs assistance Sitting-balance support: Bilateral upper extremity supported Sitting balance-Leahy Scale: Poor   Postural control: Left lateral lean, Posterior lean     Standing balance comment: deferred 2/2 to unsafe                             Pertinent Vitals/Pain Pain Assessment Pain Assessment: Faces Pain Score: 3  Pain Location: L hip    Home Living Family/patient expects to be discharged to:: Private residence Living Arrangements: Children;Other relatives Available Help at Discharge: Family;Available 24 hours/day Type of Home: House Home Access: Stairs to enter Entrance Stairs-Rails: Can reach both Entrance Stairs-Number of Steps: 3   Home Layout: One level Home Equipment: None      Prior Function Prior Level of Function : Independent/Modified Independent             Mobility Comments: Independent ambulator at hosuehold level and community level activity participation. ADLs Comments: Ind     Hand Dominance   Dominant Hand: Right    Extremity/Trunk Assessment   Upper Extremity Assessment Upper Extremity Assessment: LUE deficits/detail LUE Deficits / Details: weakness LUE Sensation: decreased light touch LUE Coordination: decreased gross motor;decreased fine motor    Lower Extremity Assessment Lower Extremity Assessment: Generalized weakness;LLE deficits/detail LLE Deficits / Details: Weakness LLE Sensation: decreased light touch LLE Coordination: decreased fine motor;decreased gross motor       Communication   Communication: No difficulties  Cognition Arousal/Alertness: Awake/alert Behavior During Therapy: WFL for tasks assessed/performed, Flat affect Overall Cognitive Status: Within Functional Limits for tasks assessed                                 General Comments: slow processing        General Comments      Exercises      Assessment/Plan    PT Assessment Patient needs continued PT services  PT Problem List Decreased strength;Decreased range of motion;Decreased activity tolerance;Decreased balance;Decreased mobility;Decreased coordination;Decreased knowledge of use of DME;Decreased safety awareness;Decreased knowledge of precautions;Pain       PT Treatment Interventions Gait training;Stair training;Functional mobility training;Therapeutic activities;Therapeutic exercise;Balance training;Neuromuscular re-education;Patient/family education    PT Goals (Current goals can be found in the Care Plan section)  Acute Rehab PT Goals Patient Stated Goal: " I want to get better." PT Goal Formulation: With patient Time For Goal Achievement: 03/03/23 Potential to Achieve Goals: Fair    Frequency 7X/week     Co-evaluation               AM-PAC PT "6 Clicks" Mobility  Outcome Measure Help needed turning from your back to your side while in a flat bed without using bedrails?: A Lot Help needed moving from lying on your back to sitting on the side of a flat bed without using bedrails?: A Lot Help needed moving to and from a bed to a chair (including a wheelchair)?: A Lot Help needed standing up from a chair using your arms (e.g., wheelchair or bedside chair)?: A Lot  Help needed to walk in hospital room?: Total Help needed climbing 3-5 steps with a railing? : Total 6 Click Score: 10    End of Session   Activity Tolerance: Treatment limited secondary to medical complications (Comment) Patient left: in bed;with nursing/sitter in room Nurse Communication: Mobility status PT Visit Diagnosis: Unsteadiness on feet (R26.81);Repeated falls (R29.6);Muscle weakness (generalized) (M62.81);Difficulty in walking, not elsewhere classified (R26.2);Pain;Hemiplegia and hemiparesis    Time: 1207-1238 PT Time Calculation (min) (ACUTE ONLY): 31 min   Charges:   PT Evaluation $PT Eval Moderate Complexity: 1 Mod PT  Treatments $Therapeutic Activity: 8-22 mins      Coleta Grosshans PT DPT 3:13 PM,02/17/23

## 2023-02-17 NOTE — ED Notes (Signed)
Pt is asleep at this time.

## 2023-02-17 NOTE — ED Notes (Signed)
Echocardiogram being performed at this time. Xray already came to do abdominal xray. More blankets and pillow provided.

## 2023-02-17 NOTE — Progress Notes (Signed)
OT Cancellation Note  Patient Details Name: [name] MRN: [id number] DOB: 1951-02-02   Cancelled Treatment:    Reason Eval/Treat Not Completed: Fatigue/lethargy limiting ability to participate;Other (comment). OT orders received, chart reviewed. Per physical therapist, pt is experiencing nausea/vomiting with mobility attempts. Was unable to tolerate OOB activity this PM. Pending further work-up for "possible acute punctate infarct". Will hold at this time and re-attempt at a later date/time as available and pt medically appropriate for OT services.   Rockney Ghee, M.S., OTR/L 02/17/23, 12:50 PM

## 2023-02-17 NOTE — Progress Notes (Signed)
PROGRESS NOTE    KELDRIC POYER  ZOX:[id number] DOB: [birth year] DOA: 02/16/2023 PCP: Gildardo Pounds, PA  Outpatient Specialists: endocrinology    Brief Narrative:   From admission h and p [name] [place] Pettengill is a 72 y.o. male with medical history significant of  DMII , diabetic neuropathy, hx of CVA,hypertension,CAD s/p remote MI, Right Carotida stenosis Who presents with multiple falls the last resulting in left hip pain and inability to stand. Patient states he was walking home from the store as he usually does each morning and note that his legs gave way multiple time while walking home. Patient states he completed this walk the day before with out any issues.. He states that it feels as if his  left leg is going to fast for his right. He notes when he fell his also his is chest. He note no following sob, but noted pain in breathing. He states he believes he felt due to severe neuropathy from his diabetes as he had fallen in the past and has difficulty feeling the ground beneath him. Currently s/p medication in ED his pain is well controlled .   Assessment & Plan:   Principal Problem:   Hip fracture (HCC) Active Problems:   Essential hypertension   History of CVA (cerebrovascular accident)   T2DM (type 2 diabetes mellitus) (HCC)   Diabetic peripheral neuropathy associated with type 2 diabetes mellitus (HCC)  # Left greater trochanter fracture After trip and fall at home. Ortho (Poggi) consulted in ED, formal consult pending, per EDP this is non-operative. - formal ortho consult pending - will need PT/OT eval, WTAB with a walker per Dr. Joice Lofts - vomiting this morning possibly 2/2 opioids, will d/c those  # Vomiting This morning. Possibly 2/2 pain meds. Does have hx sbo. Nbnb. - start fluids - d/c opioids - zofran prn - f/u abd x-ray and LFTs  # T2DM Uncontrolled. Daughter - cont home levemir 50 bid - SSI resistant, have asked nursing to give his AM sliding scale as this  hasn't yet been administered  # Peripheral neuropathy Severe, has lost most sensation in feet. No weakness, is symmetric. - home gabapentin - obviously better glucose control is important  # HTN This morning BPs are low - hold home meds  # Hx CVA MRI obtained on admission "to be complete" shows possible acute puncate infarct and evidence known prior lacunar infarcts. No focal deficits. - will ask radiology to evaluate MRI given this possible acute findings - cont home asa, statin   DVT prophylaxis: lovenox Code Status: full Family Communication: daughter crystal updated telephonically 4/27  Level of care: Telemetry Medical Status is: Inpatient    Consultants:  ortho  Procedures: none  Antimicrobials:  none    Subjective: Feeling nauseaus, begins vomiting. Pain in hip only when he puts weight on it. No abdominal pain or diarrhea  Objective: Vitals:   02/17/23 0500 02/17/23 0530 02/17/23 0600 02/17/23 0730  BP: 134/81 119/72 101/63 93/65  Pulse: 88 86 79 69  Resp: (!) 22 (!) 21 20 (!) 21  Temp:      TempSrc:      SpO2: 99% 96% 94% 94%  Weight:      Height:        Intake/Output Summary (Last 24 hours) at 02/17/2023 0833 Last data filed at 02/16/2023 2212 Gross per 24 hour  Intake --  Output 400 ml  Net -400 ml   Filed Weights   02/16/23 2203  Weight:  64.4 kg    Examination:  General exam: dissheveled Respiratory system: Clear to auscultation. Respiratory effort normal. Cardiovascular system: S1 & S2 heard, RRR. No JVD, murmurs, rubs, gallops or clicks. No pedal edema. Gastrointestinal system: Abdomen is nondistended, soft and nontender. No organomegaly or masses felt. Normal bowel sounds heard. Central nervous system: Alert and oriented. Sensation in feet is reduced. 5/5 lower strength Extremities: warm, no edema Skin: No rashes, lesions or ulcers Psychiatry: Judgement and insight appear normal. Mood & affect appropriate.     Data Reviewed: I  have personally reviewed following labs and imaging studies  CBC: Recent Labs  Lab 02/16/23 1709 02/17/23 0403  WBC 14.5* 9.5  NEUTROABS 11.0*  --   HGB 15.5 14.5  HCT 43.9 42.0  MCV 86.4 89.6  PLT 303 262   Basic Metabolic Panel: Recent Labs  Lab 02/16/23 1709 02/17/23 0403  NA 132* 132*  K 4.2 4.4  CL 95* 96*  CO2 29 31  GLUCOSE 348* 417*  BUN 19 21  CREATININE 0.80 0.86  CALCIUM 9.2 8.4*   GFR: Estimated Creatinine Clearance: 67.9 mL/min (by C-G formula based on SCr of 0.86 mg/dL). Liver Function Tests: Recent Labs  Lab 02/16/23 1709  AST 28  ALT 29  ALKPHOS 110  BILITOT 1.0  PROT 8.3*  ALBUMIN 4.0   No results for input(s): "LIPASE", "AMYLASE" in the last 168 hours. No results for input(s): "AMMONIA" in the last 168 hours. Coagulation Profile: No results for input(s): "INR", "PROTIME" in the last 168 hours. Cardiac Enzymes: No results for input(s): "CKTOTAL", "CKMB", "CKMBINDEX", "TROPONINI" in the last 168 hours. BNP (last 3 results) No results for input(s): "PROBNP" in the last 8760 hours. HbA1C: No results for input(s): "HGBA1C" in the last 72 hours. CBG: Recent Labs  Lab 02/17/23 0135 02/17/23 0725  GLUCAP 427* 394*   Lipid Profile: No results for input(s): "CHOL", "HDL", "LDLCALC", "TRIG", "CHOLHDL", "LDLDIRECT" in the last 72 hours. Thyroid Function Tests: No results for input(s): "TSH", "T4TOTAL", "FREET4", "T3FREE", "THYROIDAB" in the last 72 hours. Anemia Panel: No results for input(s): "VITAMINB12", "FOLATE", "FERRITIN", "TIBC", "IRON", "RETICCTPCT" in the last 72 hours. Urine analysis:    Component Value Date/Time   COLORURINE YELLOW (A) 02/04/2023 1852   APPEARANCEUR CLEAR (A) 02/04/2023 1852   LABSPEC 1.020 02/04/2023 1852   PHURINE 5.0 02/04/2023 1852   GLUCOSEU >=500 (A) 02/04/2023 1852   HGBUR NEGATIVE 02/04/2023 1852   BILIRUBINUR NEGATIVE 02/04/2023 1852   KETONESUR NEGATIVE 02/04/2023 1852   PROTEINUR 100 (A) 02/04/2023  1852   UROBILINOGEN 1.0 06/07/2013 2149   NITRITE NEGATIVE 02/04/2023 1852   LEUKOCYTESUR NEGATIVE 02/04/2023 1852   Sepsis Labs: @LABRCNTIP (procalcitonin:4,lacticidven:4)  )No results found for this or any previous visit (from the past 240 hour(s)).       Radiology Studies: MR BRAIN WO CONTRAST  Result Date: 02/17/2023 CLINICAL DATA:  Motor neuron disease suspected. Headache and weakness. Fall 1 day ago. EXAM: MRI HEAD WITHOUT CONTRAST TECHNIQUE: Multiplanar, multiecho pulse sequences of the brain and surrounding structures were obtained without intravenous contrast. COMPARISON:  CT head without contrast 02/16/2023. MR head without contrast 01/07/2012 FINDINGS: Brain: The diffusion-weighted images suggest an acute nonhemorrhagic punctate infarct in the anteromedial aspect of the right cerebral peduncle. No T2 or FLAIR signal abnormality is associated with this area. This is an area of known artifact on diffusion-weighted images. No other acute infarct present. Moderate generalized atrophy and white matter disease is present. A remote lacunar infarct is noted in the  white matter adjacent to the frontal horn of the left lateral ventricle, stable since 2013. Remote lacunar infarcts are also present in the right basal ganglia. No acute hemorrhage or mass lesion is present. The ventricles are proportionate to the degree of atrophy. No significant extraaxial fluid collection is present. The brainstem and cerebellum are otherwise within normal limits. Vascular: Flow is present in the major intracranial arteries. Skull and upper cervical spine: The craniocervical junction is normal. Upper cervical spine is within normal limits. Marrow signal is unremarkable. Sinuses/Orbits: The paranasal sinuses and mastoid air cells are clear. The globes and orbits are within normal limits. IMPRESSION: 1. Possible acute nonhemorrhagic punctate infarct in the anteromedial aspect of the right cerebral peduncle. No T2 or  FLAIR signal abnormality is associated. This may be artifact. 2. No other acute infarct. 3. Moderate generalized atrophy and white matter disease likely reflects the sequela of chronic microvascular ischemia. 4. Remote lacunar infarcts of the white matter adjacent to the frontal horn of the left lateral ventricle and right basal ganglia. Electronically Signed   By: Marin Roberts M.D.   On: 02/17/2023 06:56   CT PELVIS WO CONTRAST  Result Date: 02/16/2023 CLINICAL DATA:  Recent fall with left hip pain and unremarkable x-rays, initial encounter EXAM: CT PELVIS WITHOUT CONTRAST TECHNIQUE: Multidetector CT imaging of the pelvis was performed following the standard protocol without intravenous contrast. RADIATION DOSE REDUCTION: This exam was performed according to the departmental dose-optimization program which includes automated exposure control, adjustment of the mA and/or kV according to patient size and/or use of iterative reconstruction technique. COMPARISON:  Plain film from earlier in the same day. FINDINGS: Urinary Tract:  Bladder is well distended. Bowel:  Visualized bowel shows no obstructive changes. Vascular/Lymphatic: Vascular calcifications are seen without aneurysmal dilatation. Reproductive:  Prostate is within normal limits. Other:  No free pelvic fluid is seen. Musculoskeletal: Bony structures show a an undisplaced left greater trochanter fracture without completion into the intratrochanteric region. Femoral neck appears intact. Associated soft tissue swelling is noted. No other bony abnormality is seen. IMPRESSION: Left greater trochanter fracture without significant displacement. No intratrochanteric or femoral neck involvement is noted. Electronically Signed   By: Alcide Clever M.D.   On: 02/16/2023 19:56   CT CHEST WO CONTRAST  Result Date: 02/16/2023 CLINICAL DATA:  Chest trauma, fall.  Cardiac injury suspected. EXAM: CT CHEST WITHOUT CONTRAST TECHNIQUE: Multidetector CT imaging of  the chest was performed following the standard protocol without IV contrast. RADIATION DOSE REDUCTION: This exam was performed according to the departmental dose-optimization program which includes automated exposure control, adjustment of the mA and/or kV according to patient size and/or use of iterative reconstruction technique. COMPARISON:  None Available. FINDINGS: Cardiovascular: The heart is mildly enlarged. Prominent coronary artery atherosclerotic calcifications. Main pulmonary trunk is dilated measuring up to 3.7 cm concerning for pulmonary arterial hypertension. Atherosclerotic calcification of the aortic arch, aorta is however normal in caliber. Mediastinum/Nodes: No enlarged mediastinal or axillary lymph nodes. Thyroid gland, trachea, and esophagus demonstrate no significant findings. Lungs/Pleura: Mild bibasilar fibrotic changes with interlobular septal thickening. No evidence of pneumonia or pulmonary edema. No pleural effusion or pneumothorax. Upper Abdomen: No acute abnormality. Musculoskeletal: No fracture is seen. IMPRESSION: 1. No acute abnormality of the chest. 2. Mild bibasilar fibrotic changes with interlobular septal thickening. No evidence of pneumonia or pulmonary edema. 3. Mild cardiomegaly. 4. Prominent coronary artery atherosclerotic calcifications. 5. Main pulmonary trunk is dilated measuring up to 3.7 cm concerning for pulmonary arterial hypertension.  6. Aortic atherosclerosis. Electronically Signed   By: Larose Hires D.O.   On: 02/16/2023 17:21   CT HEAD WO CONTRAST ( )  Result Date: 02/16/2023 CLINICAL DATA:  Head trauma.  Moderate-to-severe. EXAM: CT HEAD WITHOUT CONTRAST TECHNIQUE: Contiguous axial images were obtained from the base of the skull through the vertex without intravenous contrast. RADIATION DOSE REDUCTION: This exam was performed according to the departmental dose-optimization program which includes automated exposure control, adjustment of the mA and/or kV  according to patient size and/or use of iterative reconstruction technique. COMPARISON:  CT head dated February 04, 2023 FINDINGS: Brain: No evidence of acute infarction, hemorrhage, hydrocephalus, extra-axial collection or mass lesion/mass effect. Cerebral atrophy and presumed chronic microvascular ischemic changes of the white matter, unchanged. Vascular: No hyperdense vessel or unexpected calcification. Skull: Normal. Negative for fracture or focal lesion. Sinuses/Orbits: No acute finding. Other: None. IMPRESSION: 1. No acute intracranial abnormality. 2. Cerebral atrophy and presumed chronic microvascular ischemic changes of the white matter, unchanged. Electronically Signed   By: Larose Hires D.O.   On: 02/16/2023 17:14   DG Chest 2 View  Result Date: 02/16/2023 CLINICAL DATA:  Pain, fell in yard and crawled into house, midsternal pain, LEFT hip pain EXAM: CHEST - 2 VIEW COMPARISON:  02/04/2023 FINDINGS: Normal heart size, mediastinal contours, and pulmonary vascularity. Chronic accentuation of bibasilar markings stable. No acute infiltrate, pleural effusion, or pneumothorax. Diffuse osseous demineralization. No definite fractures identified. IMPRESSION: Chronic accentuation of bibasilar markings. No acute abnormalities. Electronically Signed   By: Ulyses Southward M.D.   On: 02/16/2023 16:08   DG Hip Unilat With Pelvis 2-3 Views Left  Result Date: 02/16/2023 CLINICAL DATA:  Trauma, fall EXAM: DG HIP (WITH OR WITHOUT PELVIS) 2-3V LEFT COMPARISON:  None Available. FINDINGS: No displaced fracture or dislocation is seen. Joint spaces in both hips appear symmetrical. There is possible disc space narrowing at the L5-S1 level in lumbar spine. IMPRESSION: No fracture or dislocation is seen. Electronically Signed   By: Ernie Avena M.D.   On: 02/16/2023 14:26        Scheduled Meds:  aspirin EC  81 mg Oral Daily   gabapentin  600 mg Oral QHS   heparin  5,000 Units Subcutaneous Q8H   insulin aspart  0-20  Units Subcutaneous TID WC   insulin aspart  0-5 Units Subcutaneous QHS   insulin aspart  3 Units Subcutaneous TID WC   insulin detemir  50 Units Subcutaneous BID   rosuvastatin  10 mg Oral QHS   Continuous Infusions:  sodium chloride       LOS: 1 day     Silvano Bilis, MD [facility]   If 7PM-7AM, please contact night-coverage [email or web address] Password [facility] 02/17/2023, 8:33 AM

## 2023-02-17 NOTE — ED Notes (Signed)
Pt is now awake. Pt vomited after eating applesauce. Denies nausea. Pt wiped off and shirt removed, gown placed on pt.

## 2023-02-18 ENCOUNTER — Inpatient Hospital Stay: Payer: Medicare Other

## 2023-02-18 DIAGNOSIS — I639 Cerebral infarction, unspecified: Secondary | ICD-10-CM | POA: Insufficient documentation

## 2023-02-18 LAB — GLUCOSE, CAPILLARY
Glucose-Capillary: 50 mg/dL — ABNORMAL LOW (ref 70–99)
Glucose-Capillary: 212 mg/dL — ABNORMAL HIGH (ref 70–99)
Glucose-Capillary: 46 mg/dL — ABNORMAL LOW (ref 70–99)
Glucose-Capillary: 223 mg/dL — ABNORMAL HIGH (ref 70–99)
Glucose-Capillary: 236 mg/dL — ABNORMAL HIGH (ref 70–99)
Glucose-Capillary: 215 mg/dL — ABNORMAL HIGH (ref 70–99)

## 2023-02-18 MED ORDER — DEXTROSE 50 % IV SOLN
1.0000 | Freq: Once | INTRAVENOUS | Status: AC
  Administered 2023-02-18: 50 mL via INTRAVENOUS
  Filled 2023-02-18: qty 50

## 2023-02-18 MED ORDER — CLOPIDOGREL BISULFATE 75 MG PO TABS
75.0000 mg | ORAL_TABLET | Freq: Every day | ORAL | Status: DC
  Administered 2023-02-18 – 2023-02-20 (×3): 75 mg via ORAL
  Filled 2023-02-18 (×3): qty 1

## 2023-02-18 MED ORDER — GLUCERNA SHAKE PO LIQD
237.0000 mL | Freq: Two times a day (BID) | ORAL | Status: DC
  Administered 2023-02-18 – 2023-02-19 (×2): 237 mL via ORAL

## 2023-02-18 MED ORDER — IOHEXOL 350 MG/ML SOLN
75.0000 mL | Freq: Once | INTRAVENOUS | Status: AC | PRN
  Administered 2023-02-18: 75 mL via INTRAVENOUS

## 2023-02-18 MED ORDER — ATORVASTATIN CALCIUM 20 MG PO TABS
40.0000 mg | ORAL_TABLET | Freq: Every day | ORAL | Status: DC
  Administered 2023-02-18 – 2023-02-20 (×3): 40 mg via ORAL
  Filled 2023-02-18 (×3): qty 2

## 2023-02-18 MED ORDER — ADULT MULTIVITAMIN W/MINERALS CH
1.0000 | ORAL_TABLET | Freq: Every day | ORAL | Status: DC
  Administered 2023-02-18 – 2023-02-20 (×3): 1 via ORAL
  Filled 2023-02-18 (×3): qty 1

## 2023-02-18 MED ORDER — OXYCODONE HCL 5 MG PO TABS
5.0000 mg | ORAL_TABLET | Freq: Four times a day (QID) | ORAL | Status: DC | PRN
  Administered 2023-02-18 – 2023-02-19 (×3): 5 mg via ORAL
  Filled 2023-02-18 (×3): qty 1

## 2023-02-18 MED ORDER — INSULIN DETEMIR 100 UNIT/ML ~~LOC~~ SOLN
30.0000 [IU] | Freq: Two times a day (BID) | SUBCUTANEOUS | Status: DC
  Administered 2023-02-18 – 2023-02-19 (×3): 30 [IU] via SUBCUTANEOUS
  Filled 2023-02-18 (×4): qty 0.3

## 2023-02-18 MED ORDER — POLYETHYLENE GLYCOL 3350 17 G PO PACK
17.0000 g | PACK | Freq: Every day | ORAL | Status: DC
  Administered 2023-02-18 – 2023-02-20 (×3): 17 g via ORAL
  Filled 2023-02-18 (×3): qty 1

## 2023-02-18 MED ORDER — INSULIN DETEMIR 100 UNIT/ML ~~LOC~~ SOLN
45.0000 [IU] | Freq: Two times a day (BID) | SUBCUTANEOUS | Status: DC
  Filled 2023-02-18: qty 0.45

## 2023-02-18 MED ORDER — SENNA 8.6 MG PO TABS
1.0000 | ORAL_TABLET | Freq: Every day | ORAL | Status: DC
  Administered 2023-02-18 – 2023-02-20 (×3): 8.6 mg via ORAL
  Filled 2023-02-18 (×3): qty 1

## 2023-02-18 NOTE — Progress Notes (Addendum)
Initial Nutrition Assessment  DOCUMENTATION CODES:   Not applicable  INTERVENTION:  MVI  Trial Glucerna BID  NUTRITION DIAGNOSIS:   Altered nutrition lab value related to chronic illness as evidenced by other (comment) (hga1c 13.1, Glu 417).   GOAL:   Patient will meet greater than or equal to 90% of their needs   MONITOR:   I & O's, Labs, PO intake, Supplement acceptance, Weight trends  REASON FOR ASSESSMENT:   Consult Assessment of nutrition requirement/status, Hip fracture protocol  ASSESSMENT:   72 y.o. male with PMHx including brittle T2DM, diabetic neuropathy (lost most sensation in his feet), hx of CVA, HTN, CAD s/p remote MI, R carotid stenosis who presents after multiple falls, left hip pain and inability to stand 2/2 acute worsening chronic L sided weakness and was found to have a small infarct of the R cerebral peduncle  No surgical plans for left greater trochanter fracture without displacement. Plans for PT and pain control   Episode of emesis morning of 4/27 which has since been resolved    Labs: Na 132, Glu 417, vit D 9.55, hga1c 13.1 Meds: insulin, miralax, senokot Wt: stable wt  PO: 100% meal intake x 2 documented meals  I/O's: +1.5 L  Hga1c in 07/2022 was 13.7 and now it is 13.1   Patient may be a good candidate for CIR    Unable to reach patient via phone  NUTRITION - FOCUSED PHYSICAL EXAM:  Unable to complete- RD working remotely   Diet Order:   Diet Order             Diet Carb Modified Fluid consistency: Thin; Room service appropriate? Yes  Diet effective now                   EDUCATION NEEDS:      Skin:  Skin Assessment: Reviewed RN Assessment  Last BM:  4/24  Height:   Ht Readings from Last 1 Encounters:  02/16/23 5\' 4"  (1.626 m)    Weight:   Wt Readings from Last 1 Encounters:  02/16/23 64.4 kg    BMI:  Body mass index is 24.37 kg/m.  Estimated Nutritional Needs:   Kcal:  1600-1920 kcal  Protein:   75-95 g  Fluid:  > 1.6L    Leodis Rains, RDN, LDN  Clinical Nutrition

## 2023-02-18 NOTE — Plan of Care (Signed)
Neurology plan of care  Please see neurology consult note from yesterday for full findings and recommendations.  CTA H&N  1. No acute intracranial process. Remote lacunar infarcts in the right basal ganglia. 2. No intracranial large vessel occlusion. Mild stenosis in the bilateral cavernous and supraclinoid ICAs. Moderate stenosis in the distal right M1 and mild stenosis in the left M1 and right V4. 3. No hemodynamically significant stenosis in the neck. 4. Aortic atherosclerosis.  Stroke workup is now completed. Etiology of stroke favored to be small vessel disease.  Final recommendations: - Atorvastatin 40mg  daily - ASA 81mg  daily + plavix 75mg  daily x21 days f/b plavix 75mg  daily monotherapy after that  - I will arrange outpatient neurology f/u  Neurology will be available prn for questions going forward.  Bing Neighbors, MD [facility] [phone]  If 7pm- 7am, please page neurology on call as listed in AMION.

## 2023-02-18 NOTE — Evaluation (Addendum)
Occupational Therapy Evaluation Patient Details Name: [name] MRN: [id number] DOB: 1951/03/01 Today's Date: 02/18/2023   History of Present Illness As per EMR: [name] is a 72 y.o. male with multiple medical issues including coronary artery disease, status post MI, diabetes, chronic kidney disease, peripheral vascular disease, multiple TIAs/strokes, peripheral neuropathy, hyperlipidemia, and hypertension who normally lives independent.  The patient states that he was walking in the yard but, because of his underlying neuropathy, was unable to feel his feet or keep his balance and he fell forward landing on his flexed left knee and left side.  He presented to the emergency room complaining of left hip pain as well as chest pain and so has been admitted for further workup and pain control.  The patient denies any associated injuries, although he cannot recall whether he may have actually struck his head or not.  He denies any loss of consciousness and denies any lightheadedness, dizziness, chest pain, shortness of breath, or other symptoms which may have precipitated his fall.  Initial x-rays of his pelvis and left hip were unremarkable for fracture.  However, a subsequent CT scan of his pelvis demonstrated a nondisplaced fracture of the left greater trochanter, prompting orthopedic consultation.  CT brain revealed  Possible acute nonhemorrhagic punctate infarct in the  anteromedial aspect of the right cerebral peduncle. No T2 or FLAIR  signal abnormality is associated. This may be artifact   Clinical Impression   Patient agreeable to OT evaluation. Pt presenting with decreased independence in self care, balance, functional mobility/transfers, endurance, and safety awareness. PTA pt lived with daughter and was independent for ADLs. Pt currently functioning at Mod A for seated LB dressing and set up-supervision for seated grooming tasks (using dominant RUE). Pt demonstrated deficits in LUE  strength, ROM, coordination, and sensation impacting safety and independence in self-care tasks and functional transfers/mobility. Pt endorsed L hip pain t/o session. Pt will benefit from skilled acute OT services to address deficits noted below. OT recommends ongoing therapy upon discharge to maximize safety and independence with ADLs, decrease fall risk, decrease caregiver burden, and promote return to PLOF.      Recommendations for follow up therapy are one component of a multi-disciplinary discharge planning process, led by the attending physician.  Recommendations may be updated based on patient status, additional functional criteria and insurance authorization.   Assistance Recommended at Discharge Frequent or constant Supervision/Assistance  Patient can return home with the following Two people to help with walking and/or transfers;Two people to help with bathing/dressing/bathroom;Assistance with cooking/housework;Assist for transportation;Help with stairs or ramp for entrance;Direct supervision/assist for financial management;Direct supervision/assist for medications management    Functional Status Assessment  Patient has had a recent decline in their functional status and demonstrates the ability to make significant improvements in function in a reasonable and predictable amount of time.  Equipment Recommendations  Other (comment) (defer to next venue of care)    Recommendations for Other Services       Precautions / Restrictions Precautions Precautions: Fall Restrictions Weight Bearing Restrictions: Yes LLE Weight Bearing: Weight bearing as tolerated      Mobility Bed Mobility     General bed mobility comments: NT, pt received/left sitting in recliner    Transfers         General transfer comment: Pt deferred due to L hip pain and fatigue. Per PT, pt required +2 for STS transfer.      Balance Overall balance assessment: Needs assistance Sitting-balance support:  Bilateral upper extremity supported, Feet supported Sitting balance-Leahy Scale: Poor Sitting balance - Comments: Min A for dynamic sitting balance, increased time/effort to scoot hips forward in recliner Postural control: Posterior lean         ADL either performed or assessed with clinical judgement   ADL Overall ADL's : Needs assistance/impaired     Grooming: Set up;Sitting;Wash/dry face;Supervision/safety Grooming Details (indicate cue type and reason): completed with R hand (dominant side)             Lower Body Dressing: Moderate assistance;Sitting/lateral leans Lower Body Dressing Details (indicate cue type and reason): for socks, assistance required to maintain dynamic sitting balance                     Vision Baseline Vision/History: 1 Wears glasses (readers only) Patient Visual Report: No change from baseline       Perception     Praxis      Pertinent Vitals/Pain Pain Assessment Pain Assessment: Faces Faces Pain Scale: Hurts little more Pain Location: L hip Pain Descriptors / Indicators: Aching, Sore Pain Intervention(s): Limited activity within patient's tolerance, Monitored during session, Repositioned     Hand Dominance Right   Extremity/Trunk Assessment Upper Extremity Assessment Upper Extremity Assessment: LUE deficits/detail;Generalized weakness LUE Deficits / Details: LUE grossly 2-/5, grip strength 1/5, thumb opposition intact, impaired FTN, decreased speed/accuracy with movement, shoulder flexion <1/4 full ROM, wrist flexion/extension 3/4 full ROM, elbow flexion full ROM (assistance to prevent UE from falling back onto face), impaired LT sensation LUE Sensation: decreased light touch LUE Coordination: decreased gross motor;decreased fine motor   Lower Extremity Assessment Lower Extremity Assessment: Defer to PT evaluation       Communication Communication Communication: No difficulties   Cognition Arousal/Alertness:  Awake/alert Behavior During Therapy: WFL for tasks assessed/performed, Flat affect Overall Cognitive Status: No family/caregiver present to determine baseline cognitive functioning            General Comments: Pt oriented to self, Sunday. Stated current date is March 24th, 2024 then changed to May. Slow processing     General Comments       Exercises Other Exercises Other Exercises: OT provided education re: role of OT, OT POC, post acute recs, sitting up for all meals, EOB/OOB mobility with assistance, home/fall safety.      Shoulder Instructions      Home Living Family/patient expects to be discharged to:: Private residence Living Arrangements: Children (daughter) Available Help at Discharge: Family;Available 24 hours/day Type of Home: House Home Access: Stairs to enter [employer] of Steps: 3 Entrance Stairs-Rails: Can reach both Home Layout: One level     Bathroom Shower/Tub: Chief Strategy Officer: Standard     Home Equipment: None          Prior Functioning/Environment Prior Level of Function : Independent/Modified Independent                        OT Problem List: Decreased strength;Decreased range of motion;Decreased activity tolerance;Impaired balance (sitting and/or standing);Decreased coordination;Decreased cognition;Decreased safety awareness;Impaired UE functional use;Pain; Impaired sensation       OT Treatment/Interventions: Self-care/ADL training;Therapeutic exercise;Neuromuscular education;Energy conservation;DME and/or AE instruction;Manual therapy;Modalities;Balance training;Patient/family education;Visual/perceptual remediation/compensation;Cognitive remediation/compensation;Therapeutic activities;Splinting    OT Goals(Current goals can be found in the care plan section) Acute Rehab OT Goals Patient Stated Goal: return to PLOF OT Goal Formulation: With patient Time For Goal Achievement: 03/04/23 Potential to  Achieve Goals: Fair  OT Frequency: Min 3X/week    Co-evaluation              AM-PAC OT "6 Clicks" Daily Activity     Outcome Measure Help from another person eating meals?: A Little Help from another person taking care of personal grooming?: A Lot Help from another person toileting, which includes using toliet, bedpan, or urinal?: A Lot Help from another person bathing (including washing, rinsing, drying)?: A Lot Help from another person to put on and taking off regular upper body clothing?: A Lot Help from another person to put on and taking off regular lower body clothing?: A Lot 6 Click Score: 13   End of Session Nurse Communication: Mobility status  Activity Tolerance: Patient limited by fatigue;Patient limited by pain Patient left: in chair;with call bell/phone within reach;with chair alarm set  OT Visit Diagnosis: Unsteadiness on feet (R26.81);Repeated falls (R29.6);Muscle weakness (generalized) (M62.81);Pain;Hemiplegia and hemiparesis Hemiplegia - Right/Left: Left Hemiplegia - dominant/non-dominant: Non-Dominant Hemiplegia - caused by: Cerebral infarction Pain - Right/Left: Left Pain - part of body: Hip                Time: 1610-9604 OT Time Calculation (min): 15 min Charges:  OT General Charges $OT Visit: 1 Visit OT Evaluation $OT Eval Moderate Complexity: 1 Mod  [facility] MS, OTR/L ascom [phone]  02/18/23, 11:36 AM

## 2023-02-18 NOTE — Progress Notes (Signed)
Inpatient Rehab Admissions Coordinator:  ? ?Per therapy recommendations,  patient was screened for CIR candidacy by Saatvik Thielman, MS, CCC-SLP. At this time, Pt. Appears to be a a potential candidate for CIR. I will place   order for rehab consult per protocol for full assessment. Please contact me any with questions. ? ?Bernese Doffing, MS, CCC-SLP ?Rehab Admissions Coordinator  ?[phone] (celll) ?[phone] (office) ? ?

## 2023-02-18 NOTE — Consult Note (Signed)
NEUROLOGY CONSULTATION NOTE   Date of service: February 18, 2023 Patient Name: [name] MRN:  [id number] DOB:  [birth year] Reason for consult: abnormal MRI Requesting physician: Dr. [name] _ _ _   _ __   _ __ _ _  __ __   _ __   __ _  History of Present Illness   This is a 72 year old man with past medical history significant for diabetes, severe diabetic neuropathy, hypertension, CAD status post remote MI, right carotid stenosis, history of CVA (right caudate in 2007) who presented after a fall.  He was walking home from the store and felt that his legs were giving way and that his left leg was weaker than his right.  He typically has balance difficulties because of severe neuropathy from his diabetes and difficulty feeling the ground appropriately when he walks.  His left leg gave out and he had a fall and presented due to pain.  He was noted to have a left greater trochanter fracture that was nondisplaced.  Orthopedic surgeon felt that fracture did not require surgical treatment and recommended admission for PT and pain control.  MRI brain showed probable acute ischemic punctate infarct in the anteromedial aspect of the right cerebral peduncle, artifact could not be ruled out given that there is no associated FLAIR signal abnormality.  MRI brain was personally reviewed.   ROS   Per HPI: all other systems reviewed and are negative  Past History   I have reviewed the following:  Past Medical History:  Diagnosis Date   Acute cerebral infarction (HCC) 11/2005   right caudate internal capsule secondary to small vessel   CAD (coronary artery disease)    s/p MI in distant past (1995?); TTE 2010 - EF 55-60%   Chronic kidney disease    per pt, had kidney disease 76-28 years old   Diabetes mellitus without complication (HCC)    Hyperlipidemia    Hypertension    fluctuates   Lacunar infarction (HCC) 2006/2007   Right basal ganglion, chronic lacunar infarct   Myocardial infarction [facility])     ?1995   SBO (small bowel obstruction) (HCC) 09/2010   Resolved with conservative measures/ per pt has had 2 SBO!   Stenosis of right carotid artery    Stroke [facility]) 2010   TIA (transient ischemic attack) 06/2009   Past Surgical History:  Procedure Laterality Date   broken collarbone     right collarbone   broken shoulder     right shoulder   ESOPHAGOGASTRODUODENOSCOPY N/A 12/21/2019   Procedure: ESOPHAGOGASTRODUODENOSCOPY (EGD);  Surgeon: Toledo, Boykin Nearing, MD;  Location: [facility] ENDOSCOPY;  Service: Gastroenterology;  Laterality: N/A;   FINGER SURGERY     right hand, tip of finger mashed and broken fingers   LAPAROSCOPIC CHOLECYSTECTOMY  2009   by Dr. Derrell Lolling   Family History  Problem Relation Age of Onset   Lung cancer Mother    Emphysema Mother    Heart failure Father    Emphysema Sister    Social History   Socioeconomic History   Marital status: Married    Spouse name: Not on file   Number of children: 4   Years of education: 12   Highest education level: Not on file  Occupational History   Occupation: Education administrator  Tobacco Use   Smoking status: Never   Smokeless tobacco: Never  Vaping Use   Vaping Use: Never used  Substance and Sexual Activity   Alcohol use: No  Drug use: No   Sexual activity: Yes    Birth control/protection: None  Other Topics Concern   Not on file  Social History Narrative   Lives with his wife.  Went to  [employer], was asked to leave 2 weeks prior to graduation   Social Determinants of Health   Financial Resource Strain: Not on file  Food Insecurity: No Food Insecurity (02/17/2023)   Hunger Vital Sign    Worried About Running Out of Food in the Last Year: Never true    Ran Out of Food in the Last Year: Never true  Transportation Needs: No Transportation Needs (02/17/2023)   PRAPARE - Administrator, Civil Service (Medical): No    Lack of Transportation (Non-Medical): No  Physical Activity: Not on file  Stress: Not on  file  Social Connections: Not on file   Allergies  Allergen Reactions   Bee Venom Anaphylaxis   Penicillins Anaphylaxis    Medications   Medications Prior to Admission  Medication Sig Dispense Refill Last Dose   gabapentin (NEURONTIN) 600 MG tablet Take 600 mg by mouth at bedtime.   Past Week at unknown   insulin detemir (LEVEMIR FLEXPEN) 100 UNIT/ML FlexPen Inject 50 Units into the skin 2 (two) times daily.   02/15/2023 at pm   liraglutide (VICTOZA) 18 MG/3ML SOPN Inject 1.8 mg into the skin daily.   02/15/2023 at unknown   losartan-hydrochlorothiazide (HYZAAR) 100-25 MG tablet Take 1 tablet by mouth daily.   02/15/2023 at unknown   metFORMIN (GLUCOPHAGE) 1000 MG tablet Take 1,000 mg by mouth 2 (two) times daily.  3 02/15/2023 at pm   ondansetron (ZOFRAN-ODT) 4 MG disintegrating tablet Take 1 tablet (4 mg total) by mouth every 8 (eight) hours as needed for nausea or vomiting. 20 tablet 0 prn at prn   rosuvastatin (CRESTOR) 10 MG tablet Take 10 mg by mouth at bedtime.   02/15/2023 at pm   verapamil (VERELAN PM) 240 MG 24 hr capsule Take 240 mg by mouth daily.   5 02/15/2023 at unknown   aspirin EC 81 MG tablet Take 81 mg by mouth daily. For pain (Patient not taking: Reported on 02/16/2023)   Not Taking   BD PEN NEEDLE MICRO U/F 32G X 6 MM MISC Use pen needles for insulin injection each evening  11    meloxicam (MOBIC) 15 MG tablet Take 15 mg by mouth daily. (Patient not taking: Reported on 02/16/2023)  3 Not Taking   sildenafil (REVATIO) 20 MG tablet TAKE 1 TABLET BY MOUTH prior TO sexual activity prn  3       Current Facility-Administered Medications:    aspirin EC tablet 81 mg, 81 mg, Oral, Daily, Skip Mayer A, MD, 81 mg at 02/18/23 0840   enoxaparin (LOVENOX) injection 40 mg, 40 mg, Subcutaneous, Q24H, Wouk, Wilfred Curtis, MD, 40 mg at 02/17/23 2148   gabapentin (NEURONTIN) capsule 600 mg, 600 mg, Oral, QHS, Skip Mayer A, MD, 600 mg at 02/17/23 2148   insulin aspart (novoLOG)  injection 0-20 Units, 0-20 Units, Subcutaneous, TID WC, Wouk, Wilfred Curtis, MD, 11 Units at 02/17/23 1158   insulin aspart (novoLOG) injection 0-5 Units, 0-5 Units, Subcutaneous, QHS, Wouk, Wilfred Curtis, MD, 3 Units at 02/17/23 2159   insulin aspart (novoLOG) injection 5 Units, 5 Units, Subcutaneous, TID WC, Wouk, Wilfred Curtis, MD, 5 Units at 02/17/23 1158   morphine (PF) 2 MG/ML injection 0.5 mg, 0.5 mg, Intravenous, Q2H PRN, Lurline Del, MD,  0.5 mg at 02/17/23 2148   ondansetron (ZOFRAN) injection 4 mg, 4 mg, Intravenous, Q6H PRN, Wouk, Wilfred Curtis, MD, 4 mg at 02/17/23 0901   rosuvastatin (CRESTOR) tablet 10 mg, 10 mg, Oral, QHS, Skip Mayer A, MD, 10 mg at 02/17/23 2148  Vitals   Vitals:   02/17/23 1400 02/17/23 1502 02/17/23 1645 02/18/23 0043  BP: 110/70 107/60 117/65 (!) 142/99  Pulse: 80 70 74 100  Resp: (!) 24  18 18   Temp:  98.3 F (36.8 C) 97.7 F (36.5 C) 99.7 F (37.6 C)  TempSrc:      SpO2: 94% 95% 93% 96%  Weight:      Height:         Body mass index is 24.37 kg/m.  Physical Exam   Physical Exam Gen: A&O x4, NAD HEENT: Atraumatic, normocephalic;mucous membranes moist; oropharynx clear, tongue without atrophy or fasciculations. Neck: Supple, trachea midline. Resp: CTAB, no w/r/r CV: RRR, no m/g/r; nml S1 and S2. 2+ symmetric peripheral pulses. Abd: soft/NT/ND; nabs x 4 quad Extrem: Nml bulk; no cyanosis, clubbing, or edema.  Neuro: *MS: A&O x4. Follows multi-step commands.  *Speech: mild dysarthria, able to name and repeat *CN:    I: Deferred   II,III: PERRLA, VFF by confrontation, optic discs unable to be visualized 2/2 pupillary constriction   III,IV,VI: EOMI w/o nystagmus, no ptosis   V: Sensation intact from V1 to V3 to LT   VII: Eyelid closure was full.  L UMN facial weakness   VIII: Hearing intact to voice   IX,X: Voice normal, palate elevates symmetrically    XI: SCM/trap 5/5 bilat   XII: Tongue protrudes midline, no atrophy or  fasciculations  *Motor:   Normal bulk.  RUE full strength, LUE drift, LLE>RLE drift to bed *Sensory: L sided sensory deficit superimposed on background of severe length-dependent impairment to LT and PP.  No double-simultaneous extinction.  *Coordination:  FNF intact bilat *Reflexes:  1+ and symmetric throughout without clonus; toes down-going bilat *Gait: deferred  NIHSS  1a Level of Conscious.: 0 1b LOC Questions: 0 1c LOC Commands: 0 2 Best Gaze: 0 3 Visual: 0 4 Facial Palsy: 2 5a Motor Arm - left: 1 5b Motor Arm - Right: 0 6a Motor Leg - Left: 2 6b Motor Leg - Right: 1 7 Limb Ataxia: 0 8 Sensory: 1 9 Best Language: 0 10 Dysarthria: 1 11 Extinct. and Inatten.: 0  TOTAL: 8   Premorbid mRS = 2   Labs   CBC:  Recent Labs  Lab 02/16/23 1709 02/17/23 0403  WBC 14.5* 9.5  NEUTROABS 11.0*  --   HGB 15.5 14.5  HCT 43.9 42.0  MCV 86.4 89.6  PLT 303 262    Basic Metabolic Panel:  Lab Results  Component Value Date   NA 132 (L) 02/17/2023   K 4.4 02/17/2023   CO2 31 02/17/2023   GLUCOSE 417 (H) 02/17/2023   BUN 21 02/17/2023   CREATININE 0.86 02/17/2023   CALCIUM 8.4 (L) 02/17/2023   GFRNONAA >60 02/17/2023   GFRAA >60 06/28/2020   Lipid Panel:  Lab Results  Component Value Date   LDLCALC 89 01/07/2012   HgbA1c:  Lab Results  Component Value Date   HGBA1C 13.1 (H) 02/17/2023   Urine Drug Screen:     Component Value Date/Time   LABOPIA NONE DETECTED 01/07/2012 0614   COCAINSCRNUR NONE DETECTED 01/07/2012 0614   COCAINSCRNUR NEGATIVE 07/16/2009 1945   LABBENZ NONE DETECTED 01/07/2012 0614   LABBENZ  NEGATIVE 07/16/2009 1945   AMPHETMU NONE DETECTED 01/07/2012 0614   THCU NONE DETECTED 01/07/2012 0614   LABBARB NONE DETECTED 01/07/2012 0614    Alcohol Level     Component Value Date/Time   [facility]  01/12/2010 1536    <5        LOWEST DETECTABLE LIMIT FOR SERUM ALCOHOL IS 5 mg/dL FOR MEDICAL PURPOSES ONLY    MRI Brain 1. Possible acute  nonhemorrhagic punctate infarct in the anteromedial aspect of the right cerebral peduncle. No T2 or FLAIR signal abnormality is associated. This may be artifact. 2. No other acute infarct. 3. Moderate generalized atrophy and white matter disease likely reflects the sequela of chronic microvascular ischemia. 4. Remote lacunar infarcts of the white matter adjacent to the frontal horn of the left lateral ventricle and right basal ganglia.  Imaging personally reviewed; I agree with above interpretation but favor it to be a true infarct  TTE No intracardiac clot or etiology for stroke identified  Stroke Labs     Component Value Date/Time   CHOL 142 01/07/2012 0635   TRIG 181 (H) 01/07/2012 0635   HDL 17 (L) 01/07/2012 0635   CHOLHDL 8.4 01/07/2012 0635   VLDL 36 01/07/2012 0635   LDLCALC 89 01/07/2012 0635   LDLDIRECT 89 01/07/2012 1230    Lab Results  Component Value Date/Time   HGBA1C 13.1 (H) 02/17/2023 04:03 AM     Impression   This is a 72 year old man with past medical history significant for diabetes, severe diabetic neuropathy, hypertension, CAD status post remote MI, right carotid stenosis, history of CVA (right caudate in 2007) who presented after a fall 2/2 acute worsening of chronic L sided weakness and was found to have a small infarct of the R cerebral peduncle.   Recommendations   - Permissive HTN x48 hrs from sx onset goal BP <220/110. PRN labetalol or hydralazine if BP above these parameters. Avoid oral antihypertensives. - CTA H&N - Change rosuvastatin to atorvastatin 40mg  daily - ASA 81mg  daily + plavix 75mg  daily x21 days f/b plavix 75mg  daily monotherapy after that - q4 hr neuro checks - STAT head CT for any change in neuro exam - Tele - PT/OT/SLP - Stroke education - Amb referral to neurology upon discharge   ______________________________________________________________________   Thank you for the opportunity to take part in the care of this  patient. If you have any further questions, please contact the neurology consultation attending.  Signed,  Bing Neighbors, MD [facility] [phone]  If 7pm- 7am, please page neurology on call as listed in AMION.  **Any copied and pasted documentation in this note was written by me in another application not billed for and pasted by me into this document.

## 2023-02-18 NOTE — Progress Notes (Signed)
Physical Therapy Treatment Patient Details Name: [name] MRN: [id number] DOB: 1950-11-30 Today's Date: 02/18/2023   History of Present Illness As per EMR: [name] [name] is a 72 y.o. male with multiple medical issues including coronary artery disease, status post MI, diabetes, chronic kidney disease, peripheral vascular disease, multiple TIAs/strokes, peripheral neuropathy, hyperlipidemia, and hypertension who normally lives independent.  The patient states that he was walking in the yard but, because of his underlying neuropathy, was unable to feel his feet or keep his balance and he fell forward landing on his flexed left knee and left side.  He presented to the emergency room complaining of left hip pain as well as chest pain and so has been admitted for further workup and pain control.  The patient denies any associated injuries, although he cannot recall whether he may have actually struck his head or not.  He denies any loss of consciousness and denies any lightheadedness, dizziness, chest pain, shortness of breath, or other symptoms which may have precipitated his fall.  Initial x-rays of his pelvis and left hip were unremarkable for fracture.  However, a subsequent CT scan of his pelvis demonstrated a nondisplaced fracture of the left greater trochanter, prompting orthopedic consultation.  CT brain revealed  Possible acute nonhemorrhagic punctate infarct in the  anteromedial aspect of the right cerebral peduncle. No T2 or FLAIR  signal abnormality is associated. This may be artifact    PT Comments    Pt ready for session.  Reports no worsening of L side weakness today but does voice concern over his limited strength.  He is inc of urine and is assisted with new gown.  Transition to EOB with mod a x 1 with good effort.  He does c/o dizziness upon sitting but it is relieved with time and he does not c/o further dizziness upon standing.  No nausea or vomiting today. He does need min a x 1 for  general sitting balance.  He is able to stand to RW with mod a x 2 with Left knee buckling.  He attempts but is unable to step and move L LE.  Sat briefly on bed to remove walker as it is not helpful with transfer at this time.  Stand pivot with +2 assist to chair.  Difficulty moving feet and hips are guided by writer to chair.  Remained up with needs met.  Communicated with tech and on board +2 assist for transfer back to bed when ready.  Will place CIR orders as pt was independent at home prior to admission.  He would be a good candidate, motivated  and seems able to tolerate increased time for sessions.   Recommendations for follow up therapy are one component of a multi-disciplinary discharge planning process, led by the attending physician.  Recommendations may be updated based on patient status, additional functional criteria and insurance authorization.  Follow Up Recommendations       Assistance Recommended at Discharge Frequent or constant Supervision/Assistance  Patient can return home with the following Two people to help with walking and/or transfers;Two people to help with bathing/dressing/bathroom;Assistance with cooking/housework;Assistance with feeding;Direct supervision/assist for medications management;Direct supervision/assist for financial management;Assist for transportation;Help with stairs or ramp for entrance   Equipment Recommendations       Recommendations for Other Services       Precautions / Restrictions Precautions Precautions: Fall Restrictions Weight Bearing Restrictions: Yes LLE Weight Bearing: Weight bearing as tolerated     Mobility  Bed  Mobility Overal bed mobility: Needs Assistance Bed Mobility: Supine to Sit     Supine to sit: Mod assist          Transfers Overall transfer level: Needs assistance Equipment used: Rolling walker (2 wheels) Transfers: Sit to/from Stand Sit to Stand: Mod assist, +2 physical assistance                 Ambulation/Gait               General Gait Details: deferred   Stairs             Wheelchair Mobility    Modified Rankin (Stroke Patients Only)       Balance Overall balance assessment: Needs assistance Sitting-balance support: Bilateral upper extremity supported Sitting balance-Leahy Scale: Poor Sitting balance - Comments: +1 for min assist for balance                                    Cognition Arousal/Alertness: Awake/alert Behavior During Therapy: WFL for tasks assessed/performed, Flat affect Overall Cognitive Status: Within Functional Limits for tasks assessed                                 General Comments: slow processing        Exercises      General Comments        Pertinent Vitals/Pain Pain Assessment Pain Assessment: Faces Faces Pain Scale: Hurts little more Pain Location: L hip Pain Intervention(s): Limited activity within patient's tolerance, Monitored during session, Repositioned    Home Living                          Prior Function            PT Goals (current goals can now be found in the care plan section) Progress towards PT goals: Progressing toward goals    Frequency    7X/week      PT Plan Discharge plan needs to be updated    Co-evaluation              AM-PAC PT "6 Clicks" Mobility   Outcome Measure  Help needed turning from your back to your side while in a flat bed without using bedrails?: A Lot Help needed moving from lying on your back to sitting on the side of a flat bed without using bedrails?: A Lot Help needed moving to and from a bed to a chair (including a wheelchair)?: A Lot Help needed standing up from a chair using your arms (e.g., wheelchair or bedside chair)?: A Lot Help needed to walk in hospital room?: Total Help needed climbing 3-5 steps with a railing? : Total 6 Click Score: 10    End of Session Equipment Utilized During Treatment:  Gait belt Activity Tolerance: Patient tolerated treatment well Patient left: in chair;with call bell/phone within reach;with chair alarm set Nurse Communication: Mobility status PT Visit Diagnosis: Unsteadiness on feet (R26.81);Repeated falls (R29.6);Muscle weakness (generalized) (M62.81);Difficulty in walking, not elsewhere classified (R26.2);Pain;Hemiplegia and hemiparesis     Time: 0902-0919 PT Time Calculation (min) (ACUTE ONLY): 17 min  Charges:  $Therapeutic Activity: 8-22 mins                   Danielle Dess, PTA 02/18/23, 10:14 AM

## 2023-02-18 NOTE — Progress Notes (Signed)
PROGRESS NOTE    [name]  UUV:[id number] DOB: 1951/08/25 DOA: 02/16/2023 PCP: Gildardo Pounds, PA  Outpatient Specialists: endocrinology    Brief Narrative:   From admission h and p [name] is a 72 y.o. male with medical history significant of  DMII , diabetic neuropathy, hx of CVA,hypertension,CAD s/p remote MI, Right Carotida stenosis Who presents with multiple falls the last resulting in left hip pain and inability to stand. Patient states he was walking home from the store as he usually does each morning and note that his legs gave way multiple time while walking home. Patient states he completed this walk the day before with out any issues.. He states that it feels as if his  left leg is going to fast for his right. He notes when he fell his also his is chest. He note no following sob, but noted pain in breathing. He states he believes he felt due to severe neuropathy from his diabetes as he had fallen in the past and has difficulty feeling the ground beneath him. Currently s/p medication in ED his pain is well controlled .   Assessment & Plan:   Principal Problem:   Fracture of hip, closed, left, initial encounter [facility]) Active Problems:   Essential hypertension   History of CVA (cerebrovascular accident)   T2DM (type 2 diabetes mellitus) (HCC)   Diabetic peripheral neuropathy associated with type 2 diabetes mellitus (HCC)   Acute CVA (cerebrovascular accident) (HCC)  # Left greater trochanter fracture After trip and fall at home. Ortho following, this is non-operative - wbat with walker - PT advising CIR, referral placed  # Acute CVA # Hx CVA To cerebellar peduncle.  - neuro following - CTA pending - CIR as above - permissive htn - dapt for 21 days followed by plavix monotherapy - statin changed to atorva per neuro  # Vomiting 4/27 AM. Possibly 2/2 pain meds. Does have hx sbo but KUB negative. Nausea/vomiting resolved - monitor  #  T2DM Uncontrolled. Brittle. Hypoglycemic this morning - decrease levemir from 50 to 30 bid - SSI  - dm educator consult  # Peripheral neuropathy Severe, has lost most sensation in feet. No weakness, is symmetric. - home gabapentin - obviously better glucose control is important  # HTN Permissive htn as above   DVT prophylaxis: lovenox Code Status: full Family Communication: daughter crystal updated telephonically 4/28  Level of care: Telemetry Medical Status is: Inpatient Dispo: CIR vs snf    Consultants:  Ortho, neuro  Procedures: none  Antimicrobials:  none    Subjective: Feeling well, no nausea/vomiting  Objective: Vitals:   02/17/23 1502 02/17/23 1645 02/18/23 0043 02/18/23 0902  BP: 107/60 117/65 (!) 142/99 (!) 154/76  Pulse: 70 74 100   Resp:  18 18 19   Temp: 98.3 F (36.8 C) 97.7 F (36.5 C) 99.7 F (37.6 C) 99 F (37.2 C)  TempSrc:      SpO2: 95% 93% 96% 96%  Weight:      Height:        Intake/Output Summary (Last 24 hours) at 02/18/2023 1151 Last data filed at 02/18/2023 1034 Gross per 24 hour  Intake 2767.14 ml  Output 800 ml  Net 1967.14 ml   Filed Weights   02/16/23 2203  Weight: 64.4 kg    Examination:  General exam: dissheveled Respiratory system: Clear to auscultation. Respiratory effort normal. Cardiovascular system: S1 & S2 heard, RRR. No JVD, murmurs, rubs, gallops or clicks. No  pedal edema. Gastrointestinal system: Abdomen is nondistended, soft and nontender. No organomegaly or masses felt. Normal bowel sounds heard. Central nervous system: Alert and oriented. Sensation in feet is reduced. Decreased strength LEs L>R Extremities: warm, no edema Skin: No rashes, lesions or ulcers Psychiatry: Judgement and insight appear normal. Mood & affect appropriate.     Data Reviewed: I have personally reviewed following labs and imaging studies  CBC: Recent Labs  Lab 02/16/23 1709 02/17/23 0403  WBC 14.5* 9.5  NEUTROABS 11.0*   --   HGB 15.5 14.5  HCT 43.9 42.0  MCV 86.4 89.6  PLT 303 262   Basic Metabolic Panel: Recent Labs  Lab 02/16/23 1709 02/17/23 0403  NA 132* 132*  K 4.2 4.4  CL 95* 96*  CO2 29 31  GLUCOSE 348* 417*  BUN 19 21  CREATININE 0.80 0.86  CALCIUM 9.2 8.4*   GFR: Estimated Creatinine Clearance: 67.9 mL/min (by C-G formula based on SCr of 0.86 mg/dL). Liver Function Tests: Recent Labs  Lab 02/16/23 1709 02/17/23 0403  AST 28 49*  ALT 29 42  ALKPHOS 110 110  BILITOT 1.0 1.8*  PROT 8.3* 7.3  ALBUMIN 4.0 3.4*   Recent Labs  Lab 02/17/23 0403  LIPASE 23   No results for input(s): "AMMONIA" in the last 168 hours. Coagulation Profile: No results for input(s): "INR", "PROTIME" in the last 168 hours. Cardiac Enzymes: No results for input(s): "CKTOTAL", "CKMB", "CKMBINDEX", "TROPONINI" in the last 168 hours. BNP (last 3 results) No results for input(s): "PROBNP" in the last 8760 hours. HbA1C: Recent Labs    02/17/23 0403  HGBA1C 13.1*   CBG: Recent Labs  Lab 02/17/23 2126 02/18/23 0813 02/18/23 0831 02/18/23 0849 02/18/23 1134  GLUCAP 260* 50* 46* 212* 223*   Lipid Profile: No results for input(s): "CHOL", "HDL", "LDLCALC", "TRIG", "CHOLHDL", "LDLDIRECT" in the last 72 hours. Thyroid Function Tests: No results for input(s): "TSH", "T4TOTAL", "FREET4", "T3FREE", "THYROIDAB" in the last 72 hours. Anemia Panel: No results for input(s): "VITAMINB12", "FOLATE", "FERRITIN", "TIBC", "IRON", "RETICCTPCT" in the last 72 hours. Urine analysis:    Component Value Date/Time   COLORURINE YELLOW (A) 02/04/2023 1852   APPEARANCEUR CLEAR (A) 02/04/2023 1852   LABSPEC 1.020 02/04/2023 1852   PHURINE 5.0 02/04/2023 1852   GLUCOSEU >=500 (A) 02/04/2023 1852   HGBUR NEGATIVE 02/04/2023 1852   BILIRUBINUR NEGATIVE 02/04/2023 1852   KETONESUR NEGATIVE 02/04/2023 1852   PROTEINUR 100 (A) 02/04/2023 1852   UROBILINOGEN 1.0 06/07/2013 2149   NITRITE NEGATIVE 02/04/2023 1852    LEUKOCYTESUR NEGATIVE 02/04/2023 1852   Sepsis Labs: @LABRCNTIP (procalcitonin:4,lacticidven:4)  )No results found for this or any previous visit (from the past 240 hour(s)).       Radiology Studies: ECHOCARDIOGRAM COMPLETE  Result Date: 02/17/2023    ECHOCARDIOGRAM REPORT   Patient Name:   [name] Date of Exam: 02/17/2023 Medical Rec #:  [id number]       Height:       64.0 in Accession #:    [id number]      Weight:       142.0 lb Date of Birth:  [birth year]       BSA:          1.691 m Patient Age:    71 years        BP:           107/62 mmHg Patient Gender: M  HR:           69 bpm. Exam Location:  [facility] Procedure: 2D Echo, Cardiac Doppler and Color Doppler Indications:    Acute ischemic heart disease, unspecified I24.9  History:        Patient has no prior history of Echocardiogram examinations.  Sonographer:    Louie Boston RDCS Referring Phys: [id number] SARA-MAIZ A THOMAS IMPRESSIONS  1. Left ventricular ejection fraction, by estimation, is 60 to 65%. The left ventricle has normal function. The left ventricle has no regional wall motion abnormalities. There is mild left ventricular hypertrophy. Left ventricular diastolic parameters were normal.  2. Right ventricular systolic function is normal. The right ventricular size is normal. There is normal pulmonary artery systolic pressure. The estimated right ventricular systolic pressure is 27.0 mmHg.  3. The mitral valve is normal in structure. Mild mitral valve regurgitation. No evidence of mitral stenosis.  4. The aortic valve is tricuspid. Aortic valve regurgitation is not visualized. Aortic valve sclerosis is present, with no evidence of aortic valve stenosis.  5. The inferior vena cava is normal in size with greater than 50% respiratory variability, suggesting right atrial pressure of 3 mmHg. FINDINGS  Left Ventricle: Left ventricular ejection fraction, by estimation, is 60 to 65%. The left ventricle has normal function. The left  ventricle has no regional wall motion abnormalities. The left ventricular internal cavity size was normal in size. There is  mild left ventricular hypertrophy. Left ventricular diastolic parameters were normal. Right Ventricle: The right ventricular size is normal. No increase in right ventricular wall thickness. Right ventricular systolic function is normal. There is normal pulmonary artery systolic pressure. The tricuspid regurgitant velocity is 2.45 m/s, and  with an assumed right atrial pressure of 3 mmHg, the estimated right ventricular systolic pressure is 27.0 mmHg. Left Atrium: Left atrial size was normal in size. Right Atrium: Right atrial size was normal in size. Pericardium: There is no evidence of pericardial effusion. Mitral Valve: The mitral valve is normal in structure. Mild mitral valve regurgitation. No evidence of mitral valve stenosis. Tricuspid Valve: The tricuspid valve is normal in structure. Tricuspid valve regurgitation is not demonstrated. No evidence of tricuspid stenosis. Aortic Valve: The aortic valve is tricuspid. Aortic valve regurgitation is not visualized. Aortic valve sclerosis is present, with no evidence of aortic valve stenosis. Pulmonic Valve: The pulmonic valve was normal in structure. Pulmonic valve regurgitation is not visualized. No evidence of pulmonic stenosis. Aorta: The aortic root is normal in size and structure. Venous: The inferior vena cava is normal in size with greater than 50% respiratory variability, suggesting right atrial pressure of 3 mmHg. IAS/Shunts: No atrial level shunt detected by color flow Doppler.  LEFT VENTRICLE PLAX 2D LVIDd:         3.80 cm   Diastology LVIDs:         2.80 cm   LV e' medial:    7.29 cm/s LV PW:         1.35 cm   LV E/e' medial:  12.7 LV IVS:        1.30 cm   LV e' lateral:   9.14 cm/s LVOT diam:     2.00 cm   LV E/e' lateral: 10.2 LV SV:         55 LV SV Index:   33 LVOT Area:     3.14 cm  RIGHT VENTRICLE             IVC RV Basal  diam:  3.60 cm     IVC diam: 1.69 cm RV S prime:     11.70 cm/s TAPSE (M-mode): 2.0 cm LEFT ATRIUM             Index        RIGHT ATRIUM           Index LA diam:        3.10 cm 1.83 cm/m   RA Area:     17.10 cm LA Vol (A2C):   65.9 ml 38.96 ml/m  RA Volume:   46.90 ml  27.73 ml/m LA Vol (A4C):   34.2 ml 20.22 ml/m LA Biplane Vol: 47.3 ml 27.97 ml/m  AORTIC VALVE LVOT Vmax:   85.77 cm/s LVOT Vmean:  56.233 cm/s LVOT VTI:    0.176 m  AORTA Ao Root diam: 3.60 cm Ao Asc diam:  3.40 cm Ao Desc diam: 2.40 cm MITRAL VALVE               TRICUSPID VALVE MV Area (PHT): 3.27 cm    TR Peak grad:   24.0 mmHg MV Decel Time: 232 msec    TR Vmax:        245.00 cm/s MV E velocity: 92.80 cm/s MV A velocity: 85.90 cm/s  SHUNTS MV E/A ratio:  1.08        Systemic VTI:  0.18 m                            Systemic Diam: 2.00 cm Julien Nordmann MD Electronically signed by Julien Nordmann MD Signature Date/Time: 02/17/2023/1:06:31 PM    Final    DG Abd Portable 1V  Result Date: 02/17/2023 CLINICAL DATA:  New onset vomiting.  Last bowel movement 2 days ago. EXAM: PORTABLE ABDOMEN - 1 VIEW COMPARISON:  Abdominopelvic CT 12/20/2019. Abdominal radiographs 12/20/2019. FINDINGS: Two supine views of the abdomen are submitted. There is a normal nonobstructive bowel gas pattern. No supine evidence of pneumoperitoneum or bowel wall thickening. Cholecystectomy clips and scattered vascular calcifications are noted. There are mild degenerative changes in the spine without acute osseous abnormality. IMPRESSION: No evidence of bowel obstruction or other acute process. Electronically Signed   By: Carey Bullocks M.D.   On: 02/17/2023 09:13   MR BRAIN WO CONTRAST  Result Date: 02/17/2023 CLINICAL DATA:  Motor neuron disease suspected. Headache and weakness. Fall 1 day ago. EXAM: MRI HEAD WITHOUT CONTRAST TECHNIQUE: Multiplanar, multiecho pulse sequences of the brain and surrounding structures were obtained without intravenous contrast.  COMPARISON:  CT head without contrast 02/16/2023. MR head without contrast 01/07/2012 FINDINGS: Brain: The diffusion-weighted images suggest an acute nonhemorrhagic punctate infarct in the anteromedial aspect of the right cerebral peduncle. No T2 or FLAIR signal abnormality is associated with this area. This is an area of known artifact on diffusion-weighted images. No other acute infarct present. Moderate generalized atrophy and white matter disease is present. A remote lacunar infarct is noted in the white matter adjacent to the frontal horn of the left lateral ventricle, stable since 2013. Remote lacunar infarcts are also present in the right basal ganglia. No acute hemorrhage or mass lesion is present. The ventricles are proportionate to the degree of atrophy. No significant extraaxial fluid collection is present. The brainstem and cerebellum are otherwise within normal limits. Vascular: Flow is present in the major intracranial arteries. Skull and upper cervical spine: The craniocervical junction is normal. Upper cervical spine is within normal limits. Marrow signal  is unremarkable. Sinuses/Orbits: The paranasal sinuses and mastoid air cells are clear. The globes and orbits are within normal limits. IMPRESSION: 1. Possible acute nonhemorrhagic punctate infarct in the anteromedial aspect of the right cerebral peduncle. No T2 or FLAIR signal abnormality is associated. This may be artifact. 2. No other acute infarct. 3. Moderate generalized atrophy and white matter disease likely reflects the sequela of chronic microvascular ischemia. 4. Remote lacunar infarcts of the white matter adjacent to the frontal horn of the left lateral ventricle and right basal ganglia. Electronically Signed   By: Marin Roberts M.D.   On: 02/17/2023 06:56   CT PELVIS WO CONTRAST  Result Date: 02/16/2023 CLINICAL DATA:  Recent fall with left hip pain and unremarkable x-rays, initial encounter EXAM: CT PELVIS WITHOUT CONTRAST  TECHNIQUE: Multidetector CT imaging of the pelvis was performed following the standard protocol without intravenous contrast. RADIATION DOSE REDUCTION: This exam was performed according to the departmental dose-optimization program which includes automated exposure control, adjustment of the mA and/or kV according to patient size and/or use of iterative reconstruction technique. COMPARISON:  Plain film from earlier in the same day. FINDINGS: Urinary Tract:  Bladder is well distended. Bowel:  Visualized bowel shows no obstructive changes. Vascular/Lymphatic: Vascular calcifications are seen without aneurysmal dilatation. Reproductive:  Prostate is within normal limits. Other:  No free pelvic fluid is seen. Musculoskeletal: Bony structures show a an undisplaced left greater trochanter fracture without completion into the intratrochanteric region. Femoral neck appears intact. Associated soft tissue swelling is noted. No other bony abnormality is seen. IMPRESSION: Left greater trochanter fracture without significant displacement. No intratrochanteric or femoral neck involvement is noted. Electronically Signed   By: Alcide Clever M.D.   On: 02/16/2023 19:56   CT CHEST WO CONTRAST  Result Date: 02/16/2023 CLINICAL DATA:  Chest trauma, fall.  Cardiac injury suspected. EXAM: CT CHEST WITHOUT CONTRAST TECHNIQUE: Multidetector CT imaging of the chest was performed following the standard protocol without IV contrast. RADIATION DOSE REDUCTION: This exam was performed according to the departmental dose-optimization program which includes automated exposure control, adjustment of the mA and/or kV according to patient size and/or use of iterative reconstruction technique. COMPARISON:  None Available. FINDINGS: Cardiovascular: The heart is mildly enlarged. Prominent coronary artery atherosclerotic calcifications. Main pulmonary trunk is dilated measuring up to 3.7 cm concerning for pulmonary arterial hypertension.  Atherosclerotic calcification of the aortic arch, aorta is however normal in caliber. Mediastinum/Nodes: No enlarged mediastinal or axillary lymph nodes. Thyroid gland, trachea, and esophagus demonstrate no significant findings. Lungs/Pleura: Mild bibasilar fibrotic changes with interlobular septal thickening. No evidence of pneumonia or pulmonary edema. No pleural effusion or pneumothorax. Upper Abdomen: No acute abnormality. Musculoskeletal: No fracture is seen. IMPRESSION: 1. No acute abnormality of the chest. 2. Mild bibasilar fibrotic changes with interlobular septal thickening. No evidence of pneumonia or pulmonary edema. 3. Mild cardiomegaly. 4. Prominent coronary artery atherosclerotic calcifications. 5. Main pulmonary trunk is dilated measuring up to 3.7 cm concerning for pulmonary arterial hypertension. 6. Aortic atherosclerosis. Electronically Signed   By: Larose Hires D.O.   On: 02/16/2023 17:21   CT HEAD WO CONTRAST ( )  Result Date: 02/16/2023 CLINICAL DATA:  Head trauma.  Moderate-to-severe. EXAM: CT HEAD WITHOUT CONTRAST TECHNIQUE: Contiguous axial images were obtained from the base of the skull through the vertex without intravenous contrast. RADIATION DOSE REDUCTION: This exam was performed according to the departmental dose-optimization program which includes automated exposure control, adjustment of the mA and/or kV according to patient size and/or  use of iterative reconstruction technique. COMPARISON:  CT head dated February 04, 2023 FINDINGS: Brain: No evidence of acute infarction, hemorrhage, hydrocephalus, extra-axial collection or mass lesion/mass effect. Cerebral atrophy and presumed chronic microvascular ischemic changes of the white matter, unchanged. Vascular: No hyperdense vessel or unexpected calcification. Skull: Normal. Negative for fracture or focal lesion. Sinuses/Orbits: No acute finding. Other: None. IMPRESSION: 1. No acute intracranial abnormality. 2. Cerebral atrophy and  presumed chronic microvascular ischemic changes of the white matter, unchanged. Electronically Signed   By: Larose Hires D.O.   On: 02/16/2023 17:14   DG Chest 2 View  Result Date: 02/16/2023 CLINICAL DATA:  Pain, fell in yard and crawled into house, midsternal pain, LEFT hip pain EXAM: CHEST - 2 VIEW COMPARISON:  02/04/2023 FINDINGS: Normal heart size, mediastinal contours, and pulmonary vascularity. Chronic accentuation of bibasilar markings stable. No acute infiltrate, pleural effusion, or pneumothorax. Diffuse osseous demineralization. No definite fractures identified. IMPRESSION: Chronic accentuation of bibasilar markings. No acute abnormalities. Electronically Signed   By: Ulyses Southward M.D.   On: 02/16/2023 16:08   DG Hip Unilat With Pelvis 2-3 Views Left  Result Date: 02/16/2023 CLINICAL DATA:  Trauma, fall EXAM: DG HIP (WITH OR WITHOUT PELVIS) 2-3V LEFT COMPARISON:  None Available. FINDINGS: No displaced fracture or dislocation is seen. Joint spaces in both hips appear symmetrical. There is possible disc space narrowing at the L5-S1 level in lumbar spine. IMPRESSION: No fracture or dislocation is seen. Electronically Signed   By: Ernie Avena M.D.   On: 02/16/2023 14:26        Scheduled Meds:  aspirin EC  81 mg Oral Daily   atorvastatin  40 mg Oral Daily   clopidogrel  75 mg Oral Daily   enoxaparin (LOVENOX) injection  40 mg Subcutaneous Q24H   gabapentin  600 mg Oral QHS   insulin aspart  0-20 Units Subcutaneous TID WC   insulin aspart  0-5 Units Subcutaneous QHS   insulin aspart  5 Units Subcutaneous TID WC   Continuous Infusions:     LOS: 2 days     Silvano Bilis, MD [facility]   If 7PM-7AM, please contact night-coverage [email or web address] Password [facility] 02/18/2023, 11:51 AM

## 2023-02-18 NOTE — Progress Notes (Signed)
Patient ID: [name], male   DOB: 05/23/1951, 72 y.o.   MRN: [id number]  Subjective: Overall, the patient feels that he is doing reasonably well.  He still notes moderate discomfort in the left lateral hip region with any standing or ambulation.  However, he was able to tolerate being mobilized from the bed to the chair this morning with physical therapy.  He has no new complaints at this time.   Objective: Vital signs in last 24 hours: Temp:  [97.7 F (36.5 C)-99.7 F (37.6 C)] 99 F (37.2 C) (04/28 0902) Pulse Rate:  [70-100] 100 (04/28 0043) Resp:  [15-24] 19 (04/28 0902) BP: (102-154)/(60-99) 154/76 (04/28 0902) SpO2:  [93 %-97 %] 96 % (04/28 0902)  Intake/Output from previous day: 04/27 0701 - 04/28 0700 In: 2647.1 [P.O.:1080; I.V.:1567.1] Out: 825 [Urine:400; Emesis/NG output:425] Intake/Output this shift: No intake/output data recorded.  Recent Labs    02/16/23 1709 02/17/23 0403  HGB 15.5 14.5   Recent Labs    02/16/23 1709 02/17/23 0403  WBC 14.5* 9.5  RBC 5.08 4.69  HCT 43.9 42.0  PLT 303 262   Recent Labs    02/16/23 1709 02/17/23 0403  NA 132* 132*  K 4.2 4.4  CL 95* 96*  CO2 29 31  BUN 19 21  CREATININE 0.80 0.86  GLUCOSE 348* 417*  CALCIUM 9.2 8.4*   No results for input(s): "LABPT", "INR" in the last 72 hours.  Physical Exam: Orthopedic examination again is limited to the left hip and lower extremity.  He has mild to moderate tenderness palpation over the lateral aspect of the left hip.  This pain is reproduced with attempted active hip flexion, as well as with gentle logrolling of the left hip.  He is grossly neurovascularly intact to the left lower extremity and foot, although he again appears to have some weakness with ankle dorsiflexion and plantarflexion.  Assessment: Nondisplaced left greater trochanteric fracture.   Plan: The treatment options have been reviewed with the patient.  He will continue to be managed nonsurgically.  He  may continue to be mobilized with physical therapy, weightbearing as tolerated on the left leg with a walker for balance and support.  He may continue to receive medication for pain as deemed appropriate medically by the hospitalist on an as necessary basis.  Thank you for asking me to participate in the care of this most pleasant and unfortunate man.  I will sign off at this time.  Please arrange for him to follow-up in my office in 5 to 6 weeks for repeat x-rays of his pelvis and left hip.  If you have further need of orthopedic input during this hospitalization, please reconsult me.   Excell Seltzer Arienne Gartin 02/18/2023, 9:58 AM

## 2023-02-19 LAB — GLUCOSE, CAPILLARY
Glucose-Capillary: 68 mg/dL — ABNORMAL LOW (ref 70–99)
Glucose-Capillary: 108 mg/dL — ABNORMAL HIGH (ref 70–99)
Glucose-Capillary: 156 mg/dL — ABNORMAL HIGH (ref 70–99)
Glucose-Capillary: 165 mg/dL — ABNORMAL HIGH (ref 70–99)

## 2023-02-19 MED ORDER — INSULIN DETEMIR 100 UNIT/ML ~~LOC~~ SOLN
20.0000 [IU] | Freq: Every day | SUBCUTANEOUS | Status: DC
  Administered 2023-02-19: 20 [IU] via SUBCUTANEOUS
  Filled 2023-02-19: qty 0.2

## 2023-02-19 MED ORDER — INSULIN ASPART 100 UNIT/ML IJ SOLN: 0.0000 [IU] | Freq: Every day | INTRAMUSCULAR | Status: DC

## 2023-02-19 MED ORDER — INSULIN ASPART 100 UNIT/ML IJ SOLN
0.0000 [IU] | Freq: Three times a day (TID) | INTRAMUSCULAR | Status: DC
  Administered 2023-02-19: 2 [IU] via SUBCUTANEOUS
  Filled 2023-02-19: qty 1

## 2023-02-19 MED ORDER — STROKE: EARLY STAGES OF RECOVERY BOOK: Freq: Once | Status: AC

## 2023-02-19 MED ORDER — INSULIN DETEMIR 100 UNIT/ML ~~LOC~~ SOLN
30.0000 [IU] | Freq: Every day | SUBCUTANEOUS | Status: DC
  Filled 2023-02-19: qty 0.3

## 2023-02-19 NOTE — Inpatient Diabetes Management (Signed)
Inpatient Diabetes Program Recommendations  AACE/ADA: New Consensus Statement on Inpatient Glycemic Control (2015)  Target Ranges:  Prepandial:   less than 140 mg/dL      Peak postprandial:   less than 180 mg/dL (1-2 hours)      Critically ill patients:  140 - 180 mg/dL   Lab Results  Component Value Date   GLUCAP 108 (H) 02/19/2023   HGBA1C 13.1 (H) 02/17/2023    Latest Reference Range & Units 02/18/23 08:13 02/18/23 08:31 02/18/23 08:49 02/18/23 11:34 02/18/23 16:17 02/18/23 21:16 02/19/23 08:07 02/19/23 11:46  Glucose-Capillary 70 - 99 mg/dL 50 (L) 46 (L) 324 (H) 401 (H) 236 (H) 215 (H) 68 (L) 108 (H)  (L): Data is abnormally low (H): Data is abnormally high  Diabetes history: DM2 Outpatient Diabetes medications: Levemir 50 units BID, Victoza 1.8 mg daily, Metformin 1000 mg BID Current orders for Inpatient glycemic control: Levemir 30 units am, 20 units pm, Novolog 0-20 units TID with meals, Novolog 0-5 units QHS, Novolog 5 units TID with meals  Inpatient Diabetes Program Recommendations:   Noted Levemir decreased to 30 units am, 20 units pm. Please also consider: -Decrease Novolog correction to 0-9 units tid, 0-5 units hs  Attempted to speak with patient regarding medications he has been taking @ home. Patient states "no" he has not been taking diabetes meds and does not remember what he has been on. Attempted to call daughter but no answer. Will follow while inpatient and assist as needed.  Thank you, Billy Fischer. Skyelynn Rambeau, RN, MSN, CDE  Diabetes Coordinator Inpatient Glycemic Control Team Team Pager [phone] (8am-5pm) 02/19/2023 1:41 PM

## 2023-02-19 NOTE — Progress Notes (Signed)
Physical Therapy Treatment Patient Details Name: [name] MRN: [id number] DOB: 04-09-1951 Today's Date: 02/19/2023   History of Present Illness As per EMR: [name] is a 72 y.o. male with multiple medical issues including coronary artery disease, status post MI, diabetes, chronic kidney disease, peripheral vascular disease, multiple TIAs/strokes, peripheral neuropathy, hyperlipidemia, and hypertension who normally lives independent.  The patient states that he was walking in the yard but, because of his underlying neuropathy, was unable to feel his feet or keep his balance and he fell forward landing on his flexed left knee and left side.  He presented to the emergency room complaining of left hip pain as well as chest pain and so has been admitted for further workup and pain control.  The patient denies any associated injuries, although he cannot recall whether he may have actually struck his head or not.  He denies any loss of consciousness and denies any lightheadedness, dizziness, chest pain, shortness of breath, or other symptoms which may have precipitated his fall.  Initial x-rays of his pelvis and left hip were unremarkable for fracture.  However, a subsequent CT scan of his pelvis demonstrated a nondisplaced fracture of the left greater trochanter, prompting orthopedic consultation.  CT brain revealed  Possible acute nonhemorrhagic punctate infarct in the  anteromedial aspect of the right cerebral peduncle. No T2 or FLAIR  signal abnormality is associated. This may be artifact    PT Comments    Pt in bed but c/o significant headache despite pain meds about 90 minutes prior.  He does agree to session and puts in good effort despite headache and reports of dizziness with mobility.  Supine AROM RLE and AAROM as needed LLE.  Does report pain L hip with ex.  He is able to single leg bridge using RLE to reposition hips in bed for transition to sitting.  Mod assist to transition and overall  sitting balance is improved today. He continues to need close supervision and occasional min assist at times while participating in ADL tasks with OT who does arrive during session for some overlap for transfers for pt and staff safety.  He is able to stand to RW with mod A x 2 with less  LLE buckling today and overall improved posture.  Steps with walker are difficult.  He sits on bed and stands a second attempt with HHA +2 and is able to take several fait quality steps to recliner at bedside with min a x 2.  While pt does put in good effort during session he is limited by his headache and dizziness but no nausea today.    Recommendations for follow up therapy are one component of a multi-disciplinary discharge planning process, led by the attending physician.  Recommendations may be updated based on patient status, additional functional criteria and insurance authorization.  Follow Up Recommendations       Assistance Recommended at Discharge Frequent or constant Supervision/Assistance  Patient can return home with the following Two people to help with walking and/or transfers;Two people to help with bathing/dressing/bathroom;Assistance with cooking/housework;Assistance with feeding;Direct supervision/assist for medications management;Direct supervision/assist for financial management;Assist for transportation;Help with stairs or ramp for entrance   Equipment Recommendations       Recommendations for Other Services       Precautions / Restrictions Precautions Precautions: Fall Restrictions Weight Bearing Restrictions: Yes LLE Weight Bearing: Weight bearing as tolerated     Mobility  Bed Mobility Overal bed mobility: Needs Assistance Bed  Mobility: Supine to Sit     Supine to sit: Mod assist       Patient Response: Cooperative  Transfers Overall transfer level: Needs assistance Equipment used: Rolling walker (2 wheels), None Transfers: Sit to/from Stand, Bed to  chair/wheelchair/BSC Sit to Stand: Mod assist, +2 physical assistance   Step pivot transfers: Min assist, +2 physical assistance       General transfer comment: pt able to take good steps today with HHA+2 stand pivot to chair.  overall improved from yesterday but was hesitant to step with walker    Ambulation/Gait               General Gait Details: deferred   Stairs             Wheelchair Mobility    Modified Rankin (Stroke Patients Only)       Balance Overall balance assessment: Needs assistance Sitting-balance support: Bilateral upper extremity supported, Feet supported Sitting balance-Leahy Scale: Poor Sitting balance - Comments: cga to min a for sitting balance while doing ADL tasks with OT Postural control: Posterior lean   Standing balance-Leahy Scale: Poor Standing balance comment: +2 hands on assist for safety and assist                            Cognition Arousal/Alertness: Awake/alert Behavior During Therapy: WFL for tasks assessed/performed, Flat affect Overall Cognitive Status: Within Functional Limits for tasks assessed                                          Exercises Other Exercises Other Exercises: BLE supine ex x 10 with emphasis on LLE, bridges using RLE only for repositioning in bed for transition to sitting    General Comments        Pertinent Vitals/Pain Pain Assessment Pain Assessment: 0-10 Faces Pain Scale: Hurts whole lot Pain Location: headache 8/10, L hip grimmaces with movement Pain Descriptors / Indicators: Aching, Sore Pain Intervention(s): Limited activity within patient's tolerance, Monitored during session, Premedicated before session, Repositioned    Home Living                          Prior Function            PT Goals (current goals can now be found in the care plan section) Progress towards PT goals: Progressing toward goals    Frequency    7X/week       PT Plan Discharge plan needs to be updated    Co-evaluation              AM-PAC PT "6 Clicks" Mobility   Outcome Measure  Help needed turning from your back to your side while in a flat bed without using bedrails?: A Lot Help needed moving from lying on your back to sitting on the side of a flat bed without using bedrails?: A Lot Help needed moving to and from a bed to a chair (including a wheelchair)?: A Lot Help needed standing up from a chair using your arms (e.g., wheelchair or bedside chair)?: A Lot Help needed to walk in hospital room?: Total Help needed climbing 3-5 steps with a railing? : Total 6 Click Score: 10    End of Session Equipment Utilized During Treatment: Gait belt Activity Tolerance: Patient tolerated treatment well  Patient left: in chair;with call bell/phone within reach;with chair alarm set Nurse Communication: Mobility status PT Visit Diagnosis: Unsteadiness on feet (R26.81);Repeated falls (R29.6);Muscle weakness (generalized) (M62.81);Difficulty in walking, not elsewhere classified (R26.2);Pain;Hemiplegia and hemiparesis Hemiplegia - Right/Left: Left Hemiplegia - dominant/non-dominant: Non-dominant Pain - part of body: Hip     Time: 0981-1914 PT Time Calculation (min) (ACUTE ONLY): 10 min  Charges:  $Therapeutic Exercise: 8-22 mins                   Danielle Dess, PTA 02/19/23, 10:39 AM

## 2023-02-19 NOTE — Progress Notes (Addendum)
Inpatient Rehab Coordinator Note:  I spoke with pt over the phone to discuss CIR recommendations and goals/expectations of CIR stay.  We reviewed 3 hrs/day of therapy, physician follow up, and average length of stay 2 weeks (dependent upon progress) with goals of supervision to mod I.  I reviewed Medicare benefits with him and confirmed that he does have Medicare Part A only and no other coverages.  He is open to CIR, reports he will plan to d/c to his daughter's home.  She is at work from 5pm-9pm.  I left her a message to discuss CIR recommendations.    1302: Spoke to pt's daughter, Aggie Cosier, to confirm caregiver support.  She confirms that she does work from 5-9pm but that her spouse is there during those times so pt would have 24/7 supervision if needed.  She asked about reinstating pt's Medicare Part B, and also asked if pt could be seen by Psych service due to suicidal ideation prior to admission. I told her I would pass along to the MD.    Estill Dooms, PT, DPT Admissions Coordinator [phone] 02/19/23  10:43 AM

## 2023-02-19 NOTE — Progress Notes (Signed)
Occupational Therapy Treatment Patient Details Name: [name] MRN: [id number] DOB: 07/18/1951 Today's Date: 02/19/2023   History of present illness As per EMR: [name] is a 72 y.o. male with multiple medical issues including coronary artery disease, status post MI, diabetes, chronic kidney disease, peripheral vascular disease, multiple TIAs/strokes, peripheral neuropathy, hyperlipidemia, and hypertension who normally lives independent.  The patient states that he was walking in the yard but, because of his underlying neuropathy, was unable to feel his feet or keep his balance and he fell forward landing on his flexed left knee and left side.  He presented to the emergency room complaining of left hip pain as well as chest pain and so has been admitted for further workup and pain control.  The patient denies any associated injuries, although he cannot recall whether he may have actually struck his head or not.  He denies any loss of consciousness and denies any lightheadedness, dizziness, chest pain, shortness of breath, or other symptoms which may have precipitated his fall.  Initial x-rays of his pelvis and left hip were unremarkable for fracture.  However, a subsequent CT scan of his pelvis demonstrated a nondisplaced fracture of the left greater trochanter, prompting orthopedic consultation.  CT brain revealed  Possible acute nonhemorrhagic punctate infarct in the  anteromedial aspect of the right cerebral peduncle. No T2 or FLAIR  signal abnormality is associated. This may be artifact   OT comments  Pt seen for OT Tx. Limited this date by headache, L hip pain, and dizziness that does not improve with time, but pt agreeable and demonstrated motivation throughout session. Pt cued to wring out washcloth into kidney basin using BUE which he completed without dropping washcloth. When cued to wash his face he initiated with RUE requiring VC to utilize LUE which he was able to do but did not have  enough strength/coordination in LUE to complete fully to help remove dead skin from his beard. CGA- MIN A for sitting balance throughout. Pt endorses pain in sternum with LUE movement (AROM and PROM) that resolves with LUE at rest. Pt +2 for STS and SPT from EOB to recliner. Pt demonstrated good motivation despite complaints of pain and dizziness. Continue POC.    Recommendations for follow up therapy are one component of a multi-disciplinary discharge planning process, led by the attending physician.  Recommendations may be updated based on patient status, additional functional criteria and insurance authorization.    Assistance Recommended at Discharge Frequent or constant Supervision/Assistance  Patient can return home with the following  Two people to help with walking and/or transfers;Two people to help with bathing/dressing/bathroom;Assistance with cooking/housework;Assist for transportation;Help with stairs or ramp for entrance;Direct supervision/assist for financial management;Direct supervision/assist for medications management   Equipment Recommendations  Other (comment) (defer to next venue)    Recommendations for Other Services      Precautions / Restrictions Precautions Precautions: Fall Restrictions Weight Bearing Restrictions: Yes LLE Weight Bearing: Weight bearing as tolerated       Mobility Bed Mobility Overal bed mobility: Needs Assistance Bed Mobility: Supine to Sit     Supine to sit: Mod assist          Transfers Overall transfer level: Needs assistance Equipment used: Rolling walker (2 wheels), None Transfers: Sit to/from Stand, Bed to chair/wheelchair/BSC Sit to Stand: Mod assist, +2 physical assistance     Step pivot transfers: Min assist, +2 physical assistance     General transfer comment: pt able to  take good steps today with HHA+2 stand pivot to chair.  overall improved from yesterday but was hesitant to step with walker     Balance Overall  balance assessment: Needs assistance Sitting-balance support: Bilateral upper extremity supported, Feet supported, No upper extremity supported Sitting balance-Leahy Scale: Poor Sitting balance - Comments: cga to min a for sitting balance while doing ADL tasks with OT   Standing balance support: Bilateral upper extremity supported Standing balance-Leahy Scale: Poor Standing balance comment: +2 hands on assist for safety and assist                           ADL either performed or assessed with clinical judgement   ADL Overall ADL's : Needs assistance/impaired     Grooming: Sitting;Wash/dry face Grooming Details (indicate cue type and reason): Pt cued to wring out washcloth into kidney basin using BUE which he completed without dropping washcloth. When cued to wash his face he initiated with RUE requiring VC to utilize LUE which he was able to do but did not have enough strength/coordination in LUE to complete fully to help remove dead skin from his beard. CGA- MIN A for sitting balance from PT throughout.                                    Extremity/Trunk Assessment              Vision       Perception     Praxis      Cognition Arousal/Alertness: Awake/alert Behavior During Therapy: WFL for tasks assessed/performed, Flat affect Overall Cognitive Status: Within Functional Limits for tasks assessed                                          Exercises      Shoulder Instructions       General Comments      Pertinent Vitals/ Pain       Pain Assessment Pain Assessment: 0-10 Pain Score: 8  Pain Location: headache 8/10, L hip grimmaces with movement Pain Descriptors / Indicators: Aching, Sore Pain Intervention(s): Limited activity within patient's tolerance, Monitored during session, Premedicated before session, Repositioned  Home Living                                          Prior  Functioning/Environment              Frequency  Min 3X/week        Progress Toward Goals  OT Goals(current goals can now be found in the care plan section)  Progress towards OT goals: Progressing toward goals  Acute Rehab OT Goals Patient Stated Goal: return to PLOF OT Goal Formulation: With patient Time For Goal Achievement: 03/04/23 Potential to Achieve Goals: Fair  Plan Discharge plan remains appropriate;Frequency remains appropriate    Co-evaluation                 AM-PAC OT "6 Clicks" Daily Activity     Outcome Measure   Help from another person eating meals?: A Little Help from another person taking care of personal grooming?: A Little Help from another person toileting, which includes using  toliet, bedpan, or urinal?: A Lot Help from another person bathing (including washing, rinsing, drying)?: A Lot Help from another person to put on and taking off regular upper body clothing?: A Lot Help from another person to put on and taking off regular lower body clothing?: A Lot 6 Click Score: 14    End of Session    OT Visit Diagnosis: Unsteadiness on feet (R26.81);Repeated falls (R29.6);Muscle weakness (generalized) (M62.81);Pain;Hemiplegia and hemiparesis Hemiplegia - Right/Left: Left Hemiplegia - dominant/non-dominant: Non-Dominant Hemiplegia - caused by: Cerebral infarction Pain - Right/Left: Left Pain - part of body: Hip   Activity Tolerance Patient limited by pain   Patient Left in chair;with call bell/phone within reach;with chair alarm set   Nurse Communication Mobility status        Time: 4098-1191 OT Time Calculation (min): 10 min  Charges: OT General Charges $OT Visit: 1 Visit OT Treatments $Self Care/Home Management : 8-22 mins  Arman Filter., MPH, MS, OTR/L ascom [phone] 02/19/23, 11:00 AM

## 2023-02-19 NOTE — PMR Pre-admission (Signed)
PMR Admission Coordinator Pre-Admission Assessment  [name] is an 72 y.o., male MRN: [id number] DOB: 28-Nov-1950 Height: 5\' 4"  (162.6 cm) Weight: 64.4 kg  Insurance Information HMO:     PPO:      PCP:      IPA:      80/20:      OTHER:  PRIMARY: Medicare Part A only      Policy#: [id number]      Subscriber: pt CM Name:       Phone#:      Fax#:  Pre-Cert#: verified Health and safety inspector:  Benefits:  Phone #:      Name:  Eff. Date: 02/20/18     Deduct: $1632      Out of Pocket Max: n/a      Life Max: n/a CIR: 100%      SNF: 20 full days Outpatient: no benefit     Co-Pay:  Home Health: 100%      Co-Pay:  DME: no benefit     Co-Pay:  Providers:  SECONDARY:       Policy#:      Phone#:   Artist:       Phone#:   The Engineer, materials Information Summary" for patients in Inpatient Rehabilitation Facilities with attached "Privacy Act Statement-Health Care Records" was provided and verbally reviewed with: Patient  Emergency Contact Information Contact Information     Name Relation Home Work [place] Daughter   [phone]   Travin, Marik [phone]       Current Medical History  Patient Admitting Diagnosis: CVA, hip fracture  History of Present Illness: Pt is a 72 y/o male with PMH of CAD, MI, DM, CKD< PVD, CVA, peripheral neuropathy, HLD, and HTN who admitted to [facility] on 02/16/23 following a fall with c/o L hip pain and chest pain.  ED workup revealed WBC 14.5, BP 153/91, HR 103.  Imaging revealed left greater trochanter fracture without significant displacement.  Ortho consulted and DR. Poggi recommended nonoperative management, WBAT, and f/u in 5-6 weeks.  CTH revealed no acute intracranial abnormality.  MRI brain showed probable acute ischemic punctate infarct in the anteromedial aspect of the right cerebral peduncle.  Neurology consulted and stroke workup completed.  Recommendations for atorvastatin 40 mg QD,  asa/plavis x21 days, then plavix QD, and outpatient f/u.  Therapy ongoing and pt was recommended for CIR.   Complete NIHSS TOTAL: 2  Patient's medical record from [facility] has been reviewed by the rehabilitation admission coordinator and physician.  Past Medical History  Past Medical History:  Diagnosis Date   Acute cerebral infarction (HCC) 11/2005   right caudate internal capsule secondary to small vessel   CAD (coronary artery disease)    s/p MI in distant past (1995?); TTE 2010 - EF 55-60%   Chronic kidney disease    per pt, had kidney disease 93-66 years old   Diabetes mellitus without complication (HCC)    Hyperlipidemia    Hypertension    fluctuates   Lacunar infarction (HCC) 2006/2007   Right basal ganglion, chronic lacunar infarct   Myocardial infarction [facility])    ?1995   SBO (small bowel obstruction) (HCC) 09/2010   Resolved with conservative measures/ per pt has had 2 SBO!   Stenosis of right carotid artery    Stroke [facility]) 2010   TIA (transient ischemic attack) 06/2009    Has the patient had major surgery during 100 days prior  to admission? No  Family History   family history includes Emphysema in his mother and sister; Heart failure in his father; Lung cancer in his mother.  Current Medications  Current Facility-Administered Medications:    acetaminophen (TYLENOL) tablet 650 mg, 650 mg, Oral, Q6H PRN, Ashok Pall, Wilfred Curtis, MD, 650 mg at 02/20/23 0857   aspirin EC tablet 81 mg, 81 mg, Oral, Daily, Skip Mayer A, MD, 81 mg at 02/20/23 0857   atorvastatin (LIPITOR) tablet 40 mg, 40 mg, Oral, Daily, Jefferson Fuel, MD, 40 mg at 02/20/23 0857   clopidogrel (PLAVIX) tablet 75 mg, 75 mg, Oral, Daily, Jefferson Fuel, MD, 75 mg at 02/20/23 0857   enoxaparin (LOVENOX) injection 40 mg, 40 mg, Subcutaneous, Q24H, Wouk, Wilfred Curtis, MD, 40 mg at 02/19/23 2157   feeding supplement (GLUCERNA SHAKE) (GLUCERNA SHAKE) liquid 237 mL, 237 mL, Oral, BID BM, Wouk, Wilfred Curtis, MD,  237 mL at 02/19/23 0849   gabapentin (NEURONTIN) capsule 600 mg, 600 mg, Oral, QHS, Skip Mayer A, MD, 600 mg at 02/19/23 2157   insulin aspart (novoLOG) injection 0-5 Units, 0-5 Units, Subcutaneous, QHS, Wouk, Wilfred Curtis, MD   insulin aspart (novoLOG) injection 0-9 Units, 0-9 Units, Subcutaneous, TID WC, Wouk, Wilfred Curtis, MD, 2 Units at 02/19/23 1728   insulin aspart (novoLOG) injection 5 Units, 5 Units, Subcutaneous, TID WC, Wouk, Wilfred Curtis, MD, 5 Units at 02/20/23 0858   insulin detemir (LEVEMIR) injection 20 Units, 20 Units, Subcutaneous, QHS, Wouk, Wilfred Curtis, MD, 20 Units at 02/19/23 2158   insulin detemir (LEVEMIR) injection 30 Units, 30 Units, Subcutaneous, Daily, Wouk, Wilfred Curtis, MD   multivitamin with minerals tablet 1 tablet, 1 tablet, Oral, Daily, Wouk, Wilfred Curtis, MD, 1 tablet at 02/20/23 0857   ondansetron [facility]) injection 4 mg, 4 mg, Intravenous, Q6H PRN, Wouk, Wilfred Curtis, MD, 4 mg at 02/19/23 1402   oxyCODONE (Oxy IR/ROXICODONE) immediate release tablet 5 mg, 5 mg, Oral, Q6H PRN, Wouk, Wilfred Curtis, MD, 5 mg at 02/19/23 2157   polyethylene glycol (MIRALAX / GLYCOLAX) packet 17 g, 17 g, Oral, Daily, Wouk, Wilfred Curtis, MD, 17 g at 02/20/23 6962   senna (SENOKOT) tablet 8.6 mg, 1 tablet, Oral, Daily, Wouk, Wilfred Curtis, MD, 8.6 mg at 02/20/23 0857  Patients Current Diet:  Diet Order             Diet Carb Modified Fluid consistency: Thin; Room service appropriate? Yes with Assist  Diet effective now                   Precautions / Restrictions Precautions Precautions: Fall Restrictions Weight Bearing Restrictions: Yes LLE Weight Bearing: Weight bearing as tolerated   Has the patient had 2 or more falls or a fall with injury in the past year? Yes  Prior Activity Level Limited Community (1-2x/wk): independent with no DME, driving  Prior Functional Level Self Care: Did the patient need help bathing, dressing, using the toilet or eating?  Independent  Indoor Mobility: Did the patient need assistance with walking from room to room (with or without device)? Independent  Stairs: Did the patient need assistance with internal or external stairs (with or without device)? Independent  Functional Cognition: Did the patient need help planning regular tasks such as shopping or remembering to take medications? Independent  Patient Information Are you of Hispanic, Latino/a,or Spanish origin?: A. No, not of Hispanic, Latino/a, or Spanish origin What is your race?: B. Black or African American Do you need or want  an interpreter to communicate with a doctor or health care staff?: 0. No  Patient's Response To:  Health Literacy and Transportation Is the patient able to respond to health literacy and transportation needs?: Yes Health Literacy - How often do you need to have someone help you when you read instructions, pamphlets, or other written material from your doctor or pharmacy?: Never In the past 12 months, has lack of transportation kept you from medical appointments or from getting medications?: No In the past 12 months, has lack of transportation kept you from meetings, work, or from getting things needed for daily living?: No  Journalist, newspaper / Equipment Home Assistive Devices/Equipment: None Home Equipment: None  Prior Device Use: Indicate devices/aids used by the patient prior to current illness, exacerbation or injury? None of the above  Current Functional Level Cognition  Overall Cognitive Status: Within Functional Limits for tasks assessed Orientation Level: Oriented X4 General Comments: Pt oriented to self, Sunday. Stated current date is March 24th, 2024 then changed to May. Slow processing    Extremity Assessment (includes Sensation/Coordination)  Upper Extremity Assessment: LUE deficits/detail, Generalized weakness LUE Deficits / Details: LUE grossly 2-/5, grip strength 1/5, thumb opposition intact, impaired  FTN, decreased speed/accuracy with movement, shoulder flexion <1/4 full ROM, wrist flexion/extension 3/4 full ROM, elbow flexion full ROM (assistance to prevent UE from falling back onto face), impaired LT sensation LUE Sensation: decreased light touch LUE Coordination: decreased gross motor, decreased fine motor  Lower Extremity Assessment: Defer to PT evaluation LLE Deficits / Details: Weakness LLE Sensation: decreased light touch LLE Coordination: decreased fine motor, decreased gross motor    ADLs  Overall ADL's : Needs assistance/impaired Grooming: Sitting, Wash/dry face Grooming Details (indicate cue type and reason): Pt cued to wring out washcloth into kidney basin using BUE which he completed without dropping washcloth. When cued to wash his face he initiated with RUE requiring VC to utilize LUE which he was able to do but did not have enough strength/coordination in LUE to complete fully to help remove dead skin from his beard. CGA- MIN A for sitting balance from PT throughout. Lower Body Dressing: Moderate assistance, Sitting/lateral leans Lower Body Dressing Details (indicate cue type and reason): for socks, assistance required to maintain dynamic sitting balance    Mobility  Overal bed mobility: Needs Assistance Bed Mobility: Supine to Sit Supine to sit: Mod assist General bed mobility comments: NT, pt received/left sitting in recliner    Transfers  Overall transfer level: Needs assistance Equipment used: Rolling walker (2 wheels), None Transfers: Sit to/from Stand, Bed to chair/wheelchair/BSC Sit to Stand: Mod assist, +2 physical assistance Bed to/from chair/wheelchair/BSC transfer type:: Step pivot Step pivot transfers: Min assist, +2 physical assistance General transfer comment: pt able to take good steps today with HHA+2 stand pivot to chair.  overall improved from yesterday but was hesitant to step with walker    Ambulation / Gait / Stairs / Wheelchair Mobility   Ambulation/Gait General Gait Details: deferred    Posture / Balance Dynamic Sitting Balance Sitting balance - Comments: cga to min a for sitting balance while doing ADL tasks with OT Balance Overall balance assessment: Needs assistance Sitting-balance support: Bilateral upper extremity supported, Feet supported, No upper extremity supported Sitting balance-Leahy Scale: Poor Sitting balance - Comments: cga to min a for sitting balance while doing ADL tasks with OT Postural control: Posterior lean Standing balance support: Bilateral upper extremity supported Standing balance-Leahy Scale: Poor Standing balance comment: +2 hands on assist  for safety and assist    Special needs/care consideration Diabetic management yes   Previous Home Environment (from acute therapy documentation) Living Arrangements: Children (daughter) Available Help at Discharge: Family, Available 24 hours/day Type of Home: House Home Layout: One level Home Access: Stairs to enter Entrance Stairs-Rails: Can reach both Entrance Stairs-Number of Steps: 3 Bathroom Shower/Tub: Engineer, manufacturing systems: Standard Home Care Services: No  Discharge Living Setting Plans for Discharge Living Setting: Lives with (comment) (daughter, [name]) Type of Home at Discharge: House Discharge Home Layout: One level Discharge Home Access: Stairs to enter Entrance Stairs-Rails: Can reach both Entrance Stairs-Number of Steps: 3 Discharge Bathroom Shower/Tub: Tub/shower unit Discharge Bathroom Toilet: Standard Discharge Bathroom Accessibility: Yes How Accessible: Accessible via walker Does the patient have any problems obtaining your medications?: No  Social/Family/Support Systems Anticipated Caregiver: daughter, Marcene Duos (spouse Dottavio to return to [place] before CIR discharge) Anticipated Industrial/product designer Information: [name] [phone] Ability/Limitations of Caregiver: [name] works 5pm-9pm Caregiver  Availability: Other (Comment) (see above) Discharge Plan Discussed with Primary Caregiver: Yes Is Caregiver In Agreement with Plan?: Yes Does Caregiver/Family have Issues with Lodging/Transportation while Pt is in Rehab?: No  Goals Patient/Family Goal for Rehab: PT/OT supervision to mod I; SLP n/a Expected length of stay: 12-14 days Additional Information: Discharge plan: to daughter's home, she can provide supervision except during work hours 5pm-9pm Pt/Family Agrees to Admission and willing to participate: Yes Program Orientation Provided & Reviewed with Pt/Caregiver Including Roles  & Responsibilities: Yes Additional Information Needs: Medicare Part A only  Barriers to Discharge: Decreased caregiver support  Decrease burden of Care through IP rehab admission: n/a  Possible need for SNF placement upon discharge: Not anticipated.  Plan to discharge to daughter's home ([name]) at intermittent mod I level.    Patient Condition: I have reviewed medical records from [facility], spoken with  TOC , and patient and daughter. I discussed via phone for inpatient rehabilitation assessment.  Patient will benefit from ongoing PT and OT, can actively participate in 3 hours of therapy a day 5 days of the week, and can make measurable gains during the admission.  Patient will also benefit from the coordinated team approach during an Inpatient Acute Rehabilitation admission.  The patient will receive intensive therapy as well as Rehabilitation physician, nursing, social worker, and care management interventions.  Due to safety, disease management, medication administration, pain management, and patient education the patient requires 24 hour a day rehabilitation nursing.  The patient is currently mod assist with mobility and basic ADLs.  Discharge setting and therapy post discharge at home with home health is anticipated.  Patient has agreed to participate in the Acute Inpatient Rehabilitation Program and will admit  today.  Preadmission Screen Completed By:  Stephania Fragmin, PT, DPT 02/20/2023 10:23 AM ______________________________________________________________________   Discussed status with Dr. Berline Chough on 02/20/23 at 10:23 AM and received approval for admission today.  Admission Coordinator:  Stephania Fragmin, PT, DPT time 10:23 AM Dorna Bloom 02/20/23    Assessment/Plan: Diagnosis: R cerebral peduncle stroke with L greater trochanter hip fx.  Does the need for close, 24 hr/day Medical supervision in concert with the patient's rehab needs make it unreasonable for this patient to be served in a less intensive setting? Yes Co-Morbidities requiring supervision/potential complications: HTN, DM, CKD, prior CVA, PVD, CAD/MI Due to bladder management, bowel management, safety, skin/wound care, disease management, medication administration, pain management, and patient education, does the patient require 24 hr/day rehab nursing? Yes Does the patient require  coordinated care of a physician, rehab nurse, PT, OT, and SLP to address physical and functional deficits in the context of the above medical diagnosis(es)? Yes Addressing deficits in the following areas: balance, endurance, locomotion, strength, transferring, bowel/bladder control, bathing, dressing, feeding, grooming, and toileting Can the patient actively participate in an intensive therapy program of at least 3 hrs of therapy 5 days a week? Yes The potential for patient to make measurable gains while on inpatient rehab is good Anticipated functional outcomes upon discharge from inpatient rehab: modified independent and supervision PT, modified independent and supervision OT, n/a SLP Estimated rehab length of stay to reach the above functional goals is: 12-14 days Anticipated discharge destination: Home 10. Overall Rehab/Functional Prognosis: good   MD Signature:

## 2023-02-19 NOTE — Progress Notes (Signed)
PROGRESS NOTE    [name]  ZOX:[id number] DOB: 08-18-1951 DOA: 02/16/2023 PCP: Gildardo Pounds, PA  Outpatient Specialists: endocrinology    Brief Narrative:   From admission h and p [name] is a 72 y.o. male with medical history significant of  DMII , diabetic neuropathy, hx of CVA,hypertension,CAD s/p remote MI, Right Carotida stenosis Who presents with multiple falls the last resulting in left hip pain and inability to stand. Patient states he was walking home from the store as he usually does each morning and note that his legs gave way multiple time while walking home. Patient states he completed this walk the day before with out any issues.. He states that it feels as if his  left leg is going to fast for his right. He notes when he fell his also his is chest. He note no following sob, but noted pain in breathing. He states he believes he felt due to severe neuropathy from his diabetes as he had fallen in the past and has difficulty feeling the ground beneath him. Currently s/p medication in ED his pain is well controlled .   Assessment & Plan:   Principal Problem:   Fracture of hip, closed, left, initial encounter [facility]) Active Problems:   Essential hypertension   History of CVA (cerebrovascular accident)   T2DM (type 2 diabetes mellitus) (HCC)   Diabetic peripheral neuropathy associated with type 2 diabetes mellitus (HCC)   Acute CVA (cerebrovascular accident) (HCC)  # Left greater trochanter fracture After trip and fall at home. Ortho following, this is non-operative - wbat with walker - PT advising CIR, referral placed - ortho has signed off, will need f/u with them in 5-6 wks (Dr. Joice Lofts)  # Acute CVA # Hx CVA To cerebellar peduncle. CTA that needs intervention. Favored to be small vessel.  - neuro followed, has signed off. - CIR as above - goal is moving toward normotension - dapt for 21 days (through 5/19) followed by plavix monotherapy - statin  changed to atorva   # Vomiting 4/27 AM. Possibly 2/2 pain meds. Does have hx sbo but KUB negative. Nausea/vomiting resolved - monitor  # T2DM Uncontrolled. Brittle. Hypoglycemic this morning, during the day runs high - continue levemir 30 in the morning, will decrease evening to 20 - SSI  - dm educator consult pending  # Peripheral neuropathy Severe, has lost most sensation in feet. No weakness, is symmetric. - home gabapentin - obviously better glucose control is important  # HTN Bp wnl today - monitor   DVT prophylaxis: lovenox Code Status: full Family Communication: daughter crystal updated telephonically 4/28. No answer when called today  Level of care: Telemetry Medical Status is: Inpatient Dispo: CIR      Consultants:  Ortho, neuro  Procedures: none  Antimicrobials:  none    Subjective: Feeling well, no nausea/vomiting, tolerating diet  Objective: Vitals:   02/19/23 0541 02/19/23 0809 02/19/23 0849 02/19/23 0934  BP: (!) 145/78 127/68    Pulse: 99 95    Resp:  16 16 18   Temp:  99.2 F (37.3 C)    TempSrc:      SpO2: 91% 92%    Weight:      Height:        Intake/Output Summary (Last 24 hours) at 02/19/2023 1252 Last data filed at 02/18/2023 2113 Gross per 24 hour  Intake 360 ml  Output 1000 ml  Net -640 ml   Filed Weights   02/16/23  2203  Weight: 64.4 kg    Examination:  General exam: dissheveled Respiratory system: Clear to auscultation. Respiratory effort normal. Cardiovascular system: S1 & S2 heard, RRR. No JVD, murmurs, rubs, gallops or clicks. No pedal edema. Gastrointestinal system: Abdomen is nondistended, soft and nontender. No organomegaly or masses felt. Normal bowel sounds heard. Central nervous system: Alert and oriented. Sensation in feet is reduced. Decreased strength LEs L>R Extremities: warm, no edema Skin: No rashes, lesions or ulcers Psychiatry: Judgement and insight appear normal. Mood & affect appropriate.      Data Reviewed: I have personally reviewed following labs and imaging studies  CBC: Recent Labs  Lab 02/16/23 1709 02/17/23 0403  WBC 14.5* 9.5  NEUTROABS 11.0*  --   HGB 15.5 14.5  HCT 43.9 42.0  MCV 86.4 89.6  PLT 303 262   Basic Metabolic Panel: Recent Labs  Lab 02/16/23 1709 02/17/23 0403  NA 132* 132*  K 4.2 4.4  CL 95* 96*  CO2 29 31  GLUCOSE 348* 417*  BUN 19 21  CREATININE 0.80 0.86  CALCIUM 9.2 8.4*   GFR: Estimated Creatinine Clearance: 67.9 mL/min (by C-G formula based on SCr of 0.86 mg/dL). Liver Function Tests: Recent Labs  Lab 02/16/23 1709 02/17/23 0403  AST 28 49*  ALT 29 42  ALKPHOS 110 110  BILITOT 1.0 1.8*  PROT 8.3* 7.3  ALBUMIN 4.0 3.4*   Recent Labs  Lab 02/17/23 0403  LIPASE 23   No results for input(s): "AMMONIA" in the last 168 hours. Coagulation Profile: No results for input(s): "INR", "PROTIME" in the last 168 hours. Cardiac Enzymes: No results for input(s): "CKTOTAL", "CKMB", "CKMBINDEX", "TROPONINI" in the last 168 hours. BNP (last 3 results) No results for input(s): "PROBNP" in the last 8760 hours. HbA1C: Recent Labs    02/17/23 0403  HGBA1C 13.1*   CBG: Recent Labs  Lab 02/18/23 1134 02/18/23 1617 02/18/23 2116 02/19/23 0807 02/19/23 1146  GLUCAP 223* 236* 215* 68* 108*   Lipid Profile: No results for input(s): "CHOL", "HDL", "LDLCALC", "TRIG", "CHOLHDL", "LDLDIRECT" in the last 72 hours. Thyroid Function Tests: No results for input(s): "TSH", "T4TOTAL", "FREET4", "T3FREE", "THYROIDAB" in the last 72 hours. Anemia Panel: No results for input(s): "VITAMINB12", "FOLATE", "FERRITIN", "TIBC", "IRON", "RETICCTPCT" in the last 72 hours. Urine analysis:    Component Value Date/Time   COLORURINE YELLOW (A) 02/04/2023 1852   APPEARANCEUR CLEAR (A) 02/04/2023 1852   LABSPEC 1.020 02/04/2023 1852   PHURINE 5.0 02/04/2023 1852   GLUCOSEU >=500 (A) 02/04/2023 1852   HGBUR NEGATIVE 02/04/2023 1852    BILIRUBINUR NEGATIVE 02/04/2023 1852   KETONESUR NEGATIVE 02/04/2023 1852   PROTEINUR 100 (A) 02/04/2023 1852   UROBILINOGEN 1.0 06/07/2013 2149   NITRITE NEGATIVE 02/04/2023 1852   LEUKOCYTESUR NEGATIVE 02/04/2023 1852   Sepsis Labs: @LABRCNTIP (procalcitonin:4,lacticidven:4)  )No results found for this or any previous visit (from the past 240 hour(s)).       Radiology Studies: CT ANGIO HEAD NECK W WO CM  Result Date: 02/18/2023 CLINICAL DATA:  Acute infarct on recent MRI EXAM: CT ANGIOGRAPHY HEAD AND NECK WITH AND WITHOUT CONTRAST TECHNIQUE: Multidetector CT imaging of the head and neck was performed using the standard protocol during bolus administration of intravenous contrast. Multiplanar CT image reconstructions and MIPs were obtained to evaluate the vascular anatomy. Carotid stenosis measurements (when applicable) are obtained utilizing NASCET criteria, using the distal internal carotid diameter as the denominator. RADIATION DOSE REDUCTION: This exam was performed according to the departmental dose-optimization program  which includes automated exposure control, adjustment of the mA and/or kV according to patient size and/or use of iterative reconstruction technique. CONTRAST:  75mL OMNIPAQUE IOHEXOL 350 MG/ML SOLN COMPARISON:  No prior CTA available, correlation is made with CT head 02/16/2023 and MRI head 02/17/2023 FINDINGS: CT HEAD FINDINGS Brain: No evidence of acute infarct, hemorrhage, mass, mass effect, or midline shift. No hydrocephalus or extra-axial fluid collection. Periventricular white matter changes, likely the sequela of chronic small vessel ischemic disease. Remote lacunar infarcts in the right basal ganglia. No definite hypodensity is seen in the right cerebral peduncle to correlate with the acute infarct on the 02/17/2023 MRI. Vascular: No hyperdense vessel. Atherosclerotic calcifications in the intracranial carotid and vertebral arteries. Skull: Negative for fracture or  focal lesion. Sinuses/Orbits: No acute finding. Other: The mastoid air cells are well aerated. CTA NECK FINDINGS Aortic arch: Standard branching. Imaged portion shows no evidence of aneurysm or dissection. No significant stenosis of the major arch vessel origins. Mild aortic atherosclerosis. Right carotid system: No evidence of dissection, occlusion, or hemodynamically significant stenosis (greater than 50%). Atherosclerotic disease at the bifurcation and in the proximal ICA is not hemodynamically significant. Left carotid system: No evidence of dissection, occlusion, or hemodynamically significant stenosis (greater than 50%). Atherosclerotic disease at the bifurcation and in the proximal ICA is not hemodynamically significant. Vertebral arteries: No evidence of dissection, occlusion, or hemodynamically significant stenosis (greater than 50%). Skeleton: No acute osseous abnormality. Degenerative changes in the cervical spine. Other neck: No acute finding. Upper chest: No focal pulmonary opacity or pleural effusion. Review of the MIP images confirms the above findings CTA HEAD FINDINGS Anterior circulation: Both internal carotid arteries are patent to the termini, with mild stenosis in the bilateral cavernous and supraclinoid segments. A1 segments patent. Normal anterior communicating artery. Anterior cerebral arteries are patent to their distal aspects without significant stenosis. Mild stenosis in the left M1 (series 10, image 221). Moderate stenosis in the distal right M1 (series 10, images 222-223). MCA branches are irregular but perfused to their distal aspects with no more than mild focal stenosis on the right (for example, series 10, image 172, 187) Posterior circulation: Vertebral arteries patent to the vertebrobasilar junction with mild stenosis in the right V4 (series 10, image 271 and 304). Posterior inferior cerebellar arteries patent proximally. Basilar is irregular but patent to its distal aspect  without significant stenosis. Superior cerebellar arteries patent proximally. Patent P1 segments. PCAs are irregular but patent through the P 2 segments without significant stenosis. Evaluation of the more distal PCAs is limited by venous contamination. The bilateral posterior communicating arteries are not visualized. Venous sinuses: As permitted by contrast timing, patent. Anatomic variants: None significant. Review of the MIP images confirms the above findings IMPRESSION: 1. No acute intracranial process. Remote lacunar infarcts in the right basal ganglia. 2. No intracranial large vessel occlusion. Mild stenosis in the bilateral cavernous and supraclinoid ICAs. Moderate stenosis in the distal right M1 and mild stenosis in the left M1 and right V4. 3. No hemodynamically significant stenosis in the neck. 4. Aortic atherosclerosis. Aortic Atherosclerosis (ICD10-I70.0). Electronically Signed   By: Wiliam Ke M.D.   On: 02/18/2023 15:24        Scheduled Meds:   stroke: early stages of recovery book   Does not apply Once   aspirin EC  81 mg Oral Daily   atorvastatin  40 mg Oral Daily   clopidogrel  75 mg Oral Daily   enoxaparin (LOVENOX) injection  40 mg  Subcutaneous Q24H   feeding supplement (GLUCERNA SHAKE)  237 mL Oral BID BM   gabapentin  600 mg Oral QHS   insulin aspart  0-20 Units Subcutaneous TID WC   insulin aspart  0-5 Units Subcutaneous QHS   insulin aspart  5 Units Subcutaneous TID WC   insulin detemir  20 Units Subcutaneous QHS   [START ON 02/20/2023] insulin detemir  30 Units Subcutaneous Daily   multivitamin with minerals  1 tablet Oral Daily   polyethylene glycol  17 g Oral Daily   senna  1 tablet Oral Daily   Continuous Infusions:     LOS: 3 days     Silvano Bilis, MD [facility]   If 7PM-7AM, please contact night-coverage [email or web address] Password [facility] 02/19/2023, 12:52 PM

## 2023-02-20 ENCOUNTER — Inpatient Hospital Stay (HOSPITAL_COMMUNITY)
Admission: RE | Admit: 2023-02-20 | Discharge: 2023-03-08 | DRG: 057 | Disposition: A | Discharge status: Home Health | Payer: Medicare Other | Source: Other Acute Inpatient Hospital | Attending: Physical Medicine and Rehabilitation | Admitting: Physical Medicine and Rehabilitation

## 2023-02-20 DIAGNOSIS — Z801 Family history of malignant neoplasm of trachea, bronchus and lung: Secondary | ICD-10-CM | POA: Diagnosis not present

## 2023-02-20 DIAGNOSIS — Z794 Long term (current) use of insulin: Secondary | ICD-10-CM

## 2023-02-20 DIAGNOSIS — I2510 Atherosclerotic heart disease of native coronary artery without angina pectoris: Secondary | ICD-10-CM | POA: Diagnosis present

## 2023-02-20 DIAGNOSIS — Z880 Allergy status to penicillin: Secondary | ICD-10-CM | POA: Diagnosis not present

## 2023-02-20 DIAGNOSIS — G4700 Insomnia, unspecified: Secondary | ICD-10-CM | POA: Diagnosis present

## 2023-02-20 DIAGNOSIS — Z91030 Bee allergy status: Secondary | ICD-10-CM

## 2023-02-20 DIAGNOSIS — M8580 Other specified disorders of bone density and structure, unspecified site: Secondary | ICD-10-CM | POA: Diagnosis present

## 2023-02-20 DIAGNOSIS — Z7984 Long term (current) use of oral hypoglycemic drugs: Secondary | ICD-10-CM | POA: Diagnosis not present

## 2023-02-20 DIAGNOSIS — I69354 Hemiplegia and hemiparesis following cerebral infarction affecting left non-dominant side: Principal | ICD-10-CM

## 2023-02-20 DIAGNOSIS — E871 Hypo-osmolality and hyponatremia: Secondary | ICD-10-CM | POA: Diagnosis present

## 2023-02-20 DIAGNOSIS — Z79899 Other long term (current) drug therapy: Secondary | ICD-10-CM

## 2023-02-20 DIAGNOSIS — I69398 Other sequelae of cerebral infarction: Secondary | ICD-10-CM | POA: Diagnosis present

## 2023-02-20 DIAGNOSIS — F4322 Adjustment disorder with anxiety: Secondary | ICD-10-CM

## 2023-02-20 DIAGNOSIS — S72115D Nondisplaced fracture of greater trochanter of left femur, subsequent encounter for closed fracture with routine healing: Secondary | ICD-10-CM

## 2023-02-20 DIAGNOSIS — Z8673 Personal history of transient ischemic attack (TIA), and cerebral infarction without residual deficits: Secondary | ICD-10-CM

## 2023-02-20 DIAGNOSIS — Z791 Long term (current) use of non-steroidal anti-inflammatories (NSAID): Secondary | ICD-10-CM

## 2023-02-20 DIAGNOSIS — E11649 Type 2 diabetes mellitus with hypoglycemia without coma: Secondary | ICD-10-CM | POA: Diagnosis not present

## 2023-02-20 DIAGNOSIS — R42 Dizziness and giddiness: Secondary | ICD-10-CM | POA: Diagnosis present

## 2023-02-20 DIAGNOSIS — I10 Essential (primary) hypertension: Secondary | ICD-10-CM | POA: Diagnosis present

## 2023-02-20 DIAGNOSIS — H5789 Other specified disorders of eye and adnexa: Secondary | ICD-10-CM | POA: Diagnosis not present

## 2023-02-20 DIAGNOSIS — I6322 Cerebral infarction due to unspecified occlusion or stenosis of basilar arteries: Secondary | ICD-10-CM

## 2023-02-20 DIAGNOSIS — E559 Vitamin D deficiency, unspecified: Secondary | ICD-10-CM | POA: Diagnosis present

## 2023-02-20 DIAGNOSIS — I63211 Cerebral infarction due to unspecified occlusion or stenosis of right vertebral arteries: Principal | ICD-10-CM | POA: Diagnosis present

## 2023-02-20 DIAGNOSIS — E1142 Type 2 diabetes mellitus with diabetic polyneuropathy: Secondary | ICD-10-CM | POA: Diagnosis present

## 2023-02-20 DIAGNOSIS — Z8249 Family history of ischemic heart disease and other diseases of the circulatory system: Secondary | ICD-10-CM

## 2023-02-20 DIAGNOSIS — Z7409 Other reduced mobility: Secondary | ICD-10-CM | POA: Diagnosis present

## 2023-02-20 DIAGNOSIS — Z7982 Long term (current) use of aspirin: Secondary | ICD-10-CM

## 2023-02-20 DIAGNOSIS — W19XXXD Unspecified fall, subsequent encounter: Secondary | ICD-10-CM | POA: Diagnosis present

## 2023-02-20 DIAGNOSIS — I252 Old myocardial infarction: Secondary | ICD-10-CM

## 2023-02-20 DIAGNOSIS — E785 Hyperlipidemia, unspecified: Secondary | ICD-10-CM | POA: Diagnosis present

## 2023-02-20 DIAGNOSIS — R519 Headache, unspecified: Secondary | ICD-10-CM | POA: Diagnosis present

## 2023-02-20 DIAGNOSIS — K5900 Constipation, unspecified: Secondary | ICD-10-CM | POA: Diagnosis not present

## 2023-02-20 DIAGNOSIS — Z825 Family history of asthma and other chronic lower respiratory diseases: Secondary | ICD-10-CM | POA: Diagnosis not present

## 2023-02-20 DIAGNOSIS — S72112D Displaced fracture of greater trochanter of left femur, subsequent encounter for closed fracture with routine healing: Secondary | ICD-10-CM | POA: Diagnosis not present

## 2023-02-20 DIAGNOSIS — S72115A Nondisplaced fracture of greater trochanter of left femur, initial encounter for closed fracture: Secondary | ICD-10-CM

## 2023-02-20 DIAGNOSIS — I639 Cerebral infarction, unspecified: Secondary | ICD-10-CM

## 2023-02-20 DIAGNOSIS — M25552 Pain in left hip: Secondary | ICD-10-CM | POA: Diagnosis not present

## 2023-02-20 LAB — GLUCOSE, CAPILLARY
Glucose-Capillary: 101 mg/dL — ABNORMAL HIGH (ref 70–99)
Glucose-Capillary: 145 mg/dL — ABNORMAL HIGH (ref 70–99)
Glucose-Capillary: 235 mg/dL — ABNORMAL HIGH (ref 70–99)
Glucose-Capillary: 273 mg/dL — ABNORMAL HIGH (ref 70–99)

## 2023-02-20 MED ORDER — GUAIFENESIN-DM 100-10 MG/5ML PO SYRP: 5.0000 mL | ORAL_SOLUTION | Freq: Four times a day (QID) | ORAL | Status: DC | PRN

## 2023-02-20 MED ORDER — INSULIN ASPART 100 UNIT/ML IJ SOLN
0.0000 [IU] | Freq: Three times a day (TID) | INTRAMUSCULAR | Status: DC
  Administered 2023-02-20 – 2023-02-21 (×2): 3 [IU] via SUBCUTANEOUS
  Administered 2023-02-21: 5 [IU] via SUBCUTANEOUS
  Administered 2023-02-21: 3 [IU] via SUBCUTANEOUS
  Administered 2023-02-22: 5 [IU] via SUBCUTANEOUS
  Administered 2023-02-22: 3 [IU] via SUBCUTANEOUS
  Administered 2023-02-22: 1 [IU] via SUBCUTANEOUS
  Administered 2023-02-23: 2 [IU] via SUBCUTANEOUS
  Administered 2023-02-23: 7 [IU] via SUBCUTANEOUS
  Administered 2023-02-23: 3 [IU] via SUBCUTANEOUS
  Administered 2023-02-24: 2 [IU] via SUBCUTANEOUS
  Administered 2023-02-24 (×2): 3 [IU] via SUBCUTANEOUS
  Administered 2023-02-25: 1 [IU] via SUBCUTANEOUS
  Administered 2023-02-25: 5 [IU] via SUBCUTANEOUS
  Administered 2023-02-25: 1 [IU] via SUBCUTANEOUS
  Administered 2023-02-26: 2 [IU] via SUBCUTANEOUS
  Administered 2023-02-26: 1 [IU] via SUBCUTANEOUS
  Administered 2023-02-26 – 2023-02-27 (×2): 3 [IU] via SUBCUTANEOUS
  Administered 2023-02-27: 1 [IU] via SUBCUTANEOUS
  Administered 2023-02-27 – 2023-02-28 (×2): 3 [IU] via SUBCUTANEOUS
  Administered 2023-02-28: 2 [IU] via SUBCUTANEOUS
  Administered 2023-02-28: 1 [IU] via SUBCUTANEOUS
  Administered 2023-03-01: 2 [IU] via SUBCUTANEOUS
  Administered 2023-03-01: 5 [IU] via SUBCUTANEOUS
  Administered 2023-03-02: 1 [IU] via SUBCUTANEOUS
  Administered 2023-03-02 – 2023-03-04 (×4): 2 [IU] via SUBCUTANEOUS
  Administered 2023-03-04 – 2023-03-05 (×3): 1 [IU] via SUBCUTANEOUS
  Administered 2023-03-05: 3 [IU] via SUBCUTANEOUS
  Administered 2023-03-06 (×2): 2 [IU] via SUBCUTANEOUS
  Administered 2023-03-06: 1 [IU] via SUBCUTANEOUS
  Administered 2023-03-07: 2 [IU] via SUBCUTANEOUS

## 2023-02-20 MED ORDER — CLOPIDOGREL BISULFATE 75 MG PO TABS: 75.0000 mg | ORAL_TABLET | Freq: Every day | ORAL | Status: DC

## 2023-02-20 MED ORDER — ONDANSETRON HCL 4 MG/2ML IJ SOLN: 4.0000 mg | Freq: Four times a day (QID) | INTRAMUSCULAR | Status: DC | PRN

## 2023-02-20 MED ORDER — CLOPIDOGREL BISULFATE 75 MG PO TABS
75.0000 mg | ORAL_TABLET | Freq: Every day | ORAL | Status: DC
  Administered 2023-02-21 – 2023-03-08 (×16): 75 mg via ORAL
  Filled 2023-02-20 (×16): qty 1

## 2023-02-20 MED ORDER — ALUM & MAG HYDROXIDE-SIMETH 200-200-20 MG/5ML PO SUSP: 30.0000 mL | ORAL | Status: DC | PRN

## 2023-02-20 MED ORDER — ONDANSETRON HCL 4 MG PO TABS: 4.0000 mg | ORAL_TABLET | Freq: Four times a day (QID) | ORAL | Status: DC | PRN

## 2023-02-20 MED ORDER — MAGNESIUM CITRATE PO SOLN: 1.0000 | Freq: Once | ORAL | Status: DC | PRN

## 2023-02-20 MED ORDER — SORBITOL 70 % SOLN
30.0000 mL | Freq: Every day | Status: DC | PRN
  Administered 2023-02-22: 30 mL via ORAL
  Filled 2023-02-20: qty 30

## 2023-02-20 MED ORDER — ADULT MULTIVITAMIN W/MINERALS CH
1.0000 | ORAL_TABLET | Freq: Every day | ORAL | Status: DC
  Administered 2023-02-21 – 2023-03-08 (×16): 1 via ORAL
  Filled 2023-02-20 (×18): qty 1

## 2023-02-20 MED ORDER — INSULIN DETEMIR 100 UNIT/ML ~~LOC~~ SOLN
20.0000 [IU] | Freq: Every day | SUBCUTANEOUS | Status: DC
  Administered 2023-02-20: 20 [IU] via SUBCUTANEOUS
  Filled 2023-02-20 (×2): qty 0.2

## 2023-02-20 MED ORDER — POLYETHYLENE GLYCOL 3350 17 G PO PACK
17.0000 g | PACK | Freq: Every day | ORAL | Status: DC
  Administered 2023-02-21 – 2023-03-04 (×9): 17 g via ORAL
  Filled 2023-02-20 (×15): qty 1

## 2023-02-20 MED ORDER — INSULIN ASPART 100 UNIT/ML IJ SOLN
0.0000 [IU] | Freq: Every day | INTRAMUSCULAR | Status: DC
  Administered 2023-02-20: 3 [IU] via SUBCUTANEOUS
  Administered 2023-02-22 – 2023-02-24 (×3): 4 [IU] via SUBCUTANEOUS
  Administered 2023-02-25: 3 [IU] via SUBCUTANEOUS
  Administered 2023-02-26: 4 [IU] via SUBCUTANEOUS
  Administered 2023-02-27 – 2023-02-28 (×2): 3 [IU] via SUBCUTANEOUS
  Administered 2023-03-02: 2 [IU] via SUBCUTANEOUS
  Administered 2023-03-03: 3 [IU] via SUBCUTANEOUS

## 2023-02-20 MED ORDER — ACETAMINOPHEN 325 MG PO TABS
650.0000 mg | ORAL_TABLET | Freq: Four times a day (QID) | ORAL | Status: DC | PRN
  Administered 2023-02-20: 650 mg via ORAL
  Filled 2023-02-20: qty 2

## 2023-02-20 MED ORDER — MAGNESIUM HYDROXIDE 400 MG/5ML PO SUSP: 30.0000 mL | Freq: Every day | ORAL | Status: DC | PRN

## 2023-02-20 MED ORDER — OXYCODONE HCL 5 MG PO TABS: 5.0000 mg | ORAL_TABLET | Freq: Four times a day (QID) | ORAL | 0 refills | Status: DC | PRN

## 2023-02-20 MED ORDER — TRAZODONE HCL 50 MG PO TABS
25.0000 mg | ORAL_TABLET | Freq: Every evening | ORAL | Status: DC | PRN
  Filled 2023-02-20: qty 1

## 2023-02-20 MED ORDER — ASPIRIN 81 MG PO TBEC: 81.0000 mg | DELAYED_RELEASE_TABLET | Freq: Every day | ORAL | Status: DC

## 2023-02-20 MED ORDER — SENNA 8.6 MG PO TABS
1.0000 | ORAL_TABLET | Freq: Every day | ORAL | Status: DC
  Administered 2023-02-21 – 2023-02-27 (×7): 8.6 mg via ORAL
  Filled 2023-02-20 (×7): qty 1

## 2023-02-20 MED ORDER — ENOXAPARIN SODIUM 40 MG/0.4ML IJ SOSY
40.0000 mg | PREFILLED_SYRINGE | INTRAMUSCULAR | Status: DC
Start: 2023-02-20 — End: 2023-02-20

## 2023-02-20 MED ORDER — LEVEMIR FLEXPEN 100 UNIT/ML ~~LOC~~ SOPN: PEN_INJECTOR | SUBCUTANEOUS | 11 refills | Status: DC

## 2023-02-20 MED ORDER — ATORVASTATIN CALCIUM 40 MG PO TABS
40.0000 mg | ORAL_TABLET | Freq: Every day | ORAL | Status: DC
  Administered 2023-02-21 – 2023-03-08 (×16): 40 mg via ORAL
  Filled 2023-02-20 (×16): qty 1

## 2023-02-20 MED ORDER — OXYCODONE HCL 5 MG PO TABS
5.0000 mg | ORAL_TABLET | Freq: Four times a day (QID) | ORAL | Status: DC | PRN
  Administered 2023-02-20 – 2023-02-22 (×2): 5 mg via ORAL
  Filled 2023-02-20 (×2): qty 1

## 2023-02-20 MED ORDER — ATORVASTATIN CALCIUM 40 MG PO TABS: 40.0000 mg | ORAL_TABLET | Freq: Every day | ORAL | Status: DC

## 2023-02-20 MED ORDER — ACETAMINOPHEN 325 MG PO TABS
325.0000 mg | ORAL_TABLET | ORAL | Status: DC | PRN
  Administered 2023-02-21 – 2023-03-01 (×11): 650 mg via ORAL
  Administered 2023-03-01: 325 mg via ORAL
  Administered 2023-03-02 – 2023-03-06 (×6): 650 mg via ORAL
  Filled 2023-02-20 (×18): qty 2

## 2023-02-20 MED ORDER — ENOXAPARIN SODIUM 40 MG/0.4ML IJ SOSY
40.0000 mg | PREFILLED_SYRINGE | INTRAMUSCULAR | Status: DC
  Administered 2023-02-20 – 2023-03-07 (×16): 40 mg via SUBCUTANEOUS
  Filled 2023-02-20 (×16): qty 0.4

## 2023-02-20 MED ORDER — INSULIN DETEMIR 100 UNIT/ML ~~LOC~~ SOLN
30.0000 [IU] | Freq: Every day | SUBCUTANEOUS | Status: DC
  Filled 2023-02-20: qty 0.3

## 2023-02-20 MED ORDER — INSULIN ASPART 100 UNIT/ML IJ SOLN
5.0000 [IU] | Freq: Three times a day (TID) | INTRAMUSCULAR | Status: DC
  Administered 2023-02-20 – 2023-02-21 (×2): 5 [IU] via SUBCUTANEOUS

## 2023-02-20 MED ORDER — ASPIRIN 81 MG PO TBEC
81.0000 mg | DELAYED_RELEASE_TABLET | Freq: Every day | ORAL | Status: DC
  Administered 2023-02-21 – 2023-03-08 (×16): 81 mg via ORAL
  Filled 2023-02-20 (×17): qty 1

## 2023-02-20 MED ORDER — GABAPENTIN 300 MG PO CAPS
600.0000 mg | ORAL_CAPSULE | Freq: Every day | ORAL | Status: DC
  Administered 2023-02-20 – 2023-03-07 (×16): 600 mg via ORAL
  Filled 2023-02-20 (×16): qty 2

## 2023-02-20 MED ORDER — METHOCARBAMOL 500 MG PO TABS
500.0000 mg | ORAL_TABLET | Freq: Four times a day (QID) | ORAL | Status: DC | PRN
  Administered 2023-02-27 – 2023-03-02 (×4): 500 mg via ORAL
  Filled 2023-02-20 (×7): qty 1

## 2023-02-20 MED ORDER — VITAMIN D (ERGOCALCIFEROL) 1.25 MG (50000 UNIT) PO CAPS
50000.0000 [IU] | ORAL_CAPSULE | ORAL | Status: DC
  Administered 2023-02-21 – 2023-03-07 (×3): 50000 [IU] via ORAL
  Filled 2023-02-20 (×3): qty 1

## 2023-02-20 NOTE — Progress Notes (Signed)
Inpatient Rehab Admissions Coordinator:   I have a bed available for this patient to admit today. Dr. Ashok Pall in agreement.  I will let pt/family know.  I will call [facility] and arrange transport once we have a room assignment.   [name], PT, DPT Admissions Coordinator [phone] 02/20/23  9:47 AM

## 2023-02-20 NOTE — Progress Notes (Signed)
PMR Admission Coordinator Pre-Admission Assessment  [name] is an 72 y.o., male MRN: [id number] DOB: 07/10/1951 Height: 5' 4" (162.6 cm) Weight: 64.4 kg  Insurance Information HMO:     PPO:      PCP:      IPA:      80/20:      OTHER:  PRIMARY: Medicare Part A only      Policy#: [id number]      Subscriber: pt CM Name:       Phone#:      Fax#:  Pre-Cert#: verified online      Employer:  Benefits:  Phone #:      Name:  Eff. Date: 02/20/18     Deduct: $1632      Out of Pocket Max: n/a      Life Max: n/a CIR: 100%      SNF: 20 full days Outpatient: no benefit     Co-Pay:  Home Health: 100%      Co-Pay:  DME: no benefit     Co-Pay:  Providers:  SECONDARY:       Policy#:      Phone#:   Financial Counselor:       Phone#:   The "Data Collection Information Summary" for patients in Inpatient Rehabilitation Facilities with attached "Privacy Act Statement-Health Care Records" was provided and verbally reviewed with: Patient  Emergency Contact Information Contact Information     Name Relation Home Work Mobile   Galvan, [name] Daughter   [phone]   Forbes,Colleen Spouse [phone]       Current Medical History  Patient Admitting Diagnosis: CVA, hip fracture  History of Present Illness: Pt is a 72 y/o male with PMH of CAD, MI, DM, CKD< PVD, CVA, peripheral neuropathy, HLD, and HTN who admitted to [facility] on 02/16/23 following a fall with c/o L hip pain and chest pain.  ED workup revealed WBC 14.5, BP 153/91, HR 103.  Imaging revealed left greater trochanter fracture without significant displacement.  Ortho consulted and DR. Poggi recommended nonoperative management, WBAT, and f/u in 5-6 weeks.  CTH revealed no acute intracranial abnormality.  MRI brain showed probable acute ischemic punctate infarct in the anteromedial aspect of the right cerebral peduncle.  Neurology consulted and stroke workup completed.  Recommendations for atorvastatin 40 mg QD,  asa/plavis x21 days, then plavix QD, and outpatient f/u.  Therapy ongoing and pt was recommended for CIR.   Complete NIHSS TOTAL: 2  Patient's medical record from [facility] has been reviewed by the rehabilitation admission coordinator and physician.  Past Medical History  Past Medical History:  Diagnosis Date   Acute cerebral infarction (HCC) 11/2005   right caudate internal capsule secondary to small vessel   CAD (coronary artery disease)    s/p MI in distant past (1995?); TTE 2010 - EF 55-60%   Chronic kidney disease    per pt, had kidney disease 8-9 years old   Diabetes mellitus without complication (HCC)    Hyperlipidemia    Hypertension    fluctuates   Lacunar infarction (HCC) 2006/2007   Right basal ganglion, chronic lacunar infarct   Myocardial infarction (HCC)    ?1995   SBO (small bowel obstruction) (HCC) 09/2010   Resolved with conservative measures/ per pt has had 2 SBO!   Stenosis of right carotid artery    Stroke (HCC) 2010   TIA (transient ischemic attack) 06/2009    Has the patient had major surgery during 100 days prior   to admission? No  Family History   family history includes Emphysema in his mother and sister; Heart failure in his father; Lung cancer in his mother.  Current Medications  Current Facility-Administered Medications:    acetaminophen (TYLENOL) tablet 650 mg, 650 mg, Oral, Q6H PRN, Wouk, Noah Bedford, MD, 650 mg at 02/20/23 0857   aspirin EC tablet 81 mg, 81 mg, Oral, Daily, Thomas, Sara-Maiz A, MD, 81 mg at 02/20/23 0857   atorvastatin (LIPITOR) tablet 40 mg, 40 mg, Oral, Daily, Stack, Colleen M, MD, 40 mg at 02/20/23 0857   clopidogrel (PLAVIX) tablet 75 mg, 75 mg, Oral, Daily, Stack, Colleen M, MD, 75 mg at 02/20/23 0857   enoxaparin (LOVENOX) injection 40 mg, 40 mg, Subcutaneous, Q24H, Wouk, Noah Bedford, MD, 40 mg at 02/19/23 2157   feeding supplement (GLUCERNA SHAKE) (GLUCERNA SHAKE) liquid 237 mL, 237 mL, Oral, BID BM, Wouk, Noah Bedford, MD,  237 mL at 02/19/23 0849   gabapentin (NEURONTIN) capsule 600 mg, 600 mg, Oral, QHS, Thomas, Sara-Maiz A, MD, 600 mg at 02/19/23 2157   insulin aspart (novoLOG) injection 0-5 Units, 0-5 Units, Subcutaneous, QHS, Wouk, Noah Bedford, MD   insulin aspart (novoLOG) injection 0-9 Units, 0-9 Units, Subcutaneous, TID WC, Wouk, Noah Bedford, MD, 2 Units at 02/19/23 1728   insulin aspart (novoLOG) injection 5 Units, 5 Units, Subcutaneous, TID WC, Wouk, Noah Bedford, MD, 5 Units at 02/20/23 0858   insulin detemir (LEVEMIR) injection 20 Units, 20 Units, Subcutaneous, QHS, Wouk, Noah Bedford, MD, 20 Units at 02/19/23 2158   insulin detemir (LEVEMIR) injection 30 Units, 30 Units, Subcutaneous, Daily, Wouk, Noah Bedford, MD   multivitamin with minerals tablet 1 tablet, 1 tablet, Oral, Daily, Wouk, Noah Bedford, MD, 1 tablet at 02/20/23 0857   ondansetron (ZOFRAN) injection 4 mg, 4 mg, Intravenous, Q6H PRN, Wouk, Noah Bedford, MD, 4 mg at 02/19/23 1402   oxyCODONE (Oxy IR/ROXICODONE) immediate release tablet 5 mg, 5 mg, Oral, Q6H PRN, Wouk, Noah Bedford, MD, 5 mg at 02/19/23 2157   polyethylene glycol (MIRALAX / GLYCOLAX) packet 17 g, 17 g, Oral, Daily, Wouk, Noah Bedford, MD, 17 g at 02/20/23 0856   senna (SENOKOT) tablet 8.6 mg, 1 tablet, Oral, Daily, Wouk, Noah Bedford, MD, 8.6 mg at 02/20/23 0857  Patients Current Diet:  Diet Order             Diet Carb Modified Fluid consistency: Thin; Room service appropriate? Yes with Assist  Diet effective now                   Precautions / Restrictions Precautions Precautions: Fall Restrictions Weight Bearing Restrictions: Yes LLE Weight Bearing: Weight bearing as tolerated   Has the patient had 2 or more falls or a fall with injury in the past year? Yes  Prior Activity Level Limited Community (1-2x/wk): independent with no DME, driving  Prior Functional Level Self Care: Did the patient need help bathing, dressing, using the toilet or eating?  Independent  Indoor Mobility: Did the patient need assistance with walking from room to room (with or without device)? Independent  Stairs: Did the patient need assistance with internal or external stairs (with or without device)? Independent  Functional Cognition: Did the patient need help planning regular tasks such as shopping or remembering to take medications? Independent  Patient Information Are you of Hispanic, Latino/a,or Spanish origin?: A. No, not of Hispanic, Latino/a, or Spanish origin What is your race?: B. Black or African American Do you need or want   an interpreter to communicate with a doctor or health care staff?: 0. No  Patient's Response To:  Health Literacy and Transportation Is the patient able to respond to health literacy and transportation needs?: Yes Health Literacy - How often do you need to have someone help you when you read instructions, pamphlets, or other written material from your doctor or pharmacy?: Never In the past 12 months, has lack of transportation kept you from medical appointments or from getting medications?: No In the past 12 months, has lack of transportation kept you from meetings, work, or from getting things needed for daily living?: No  Home Assistive Devices / Equipment Home Assistive Devices/Equipment: None Home Equipment: None  Prior Device Use: Indicate devices/aids used by the patient prior to current illness, exacerbation or injury? None of the above  Current Functional Level Cognition  Overall Cognitive Status: Within Functional Limits for tasks assessed Orientation Level: Oriented X4 General Comments: Pt oriented to self, Sunday. Stated current date is March 24th, 2024 then changed to May. Slow processing    Extremity Assessment (includes Sensation/Coordination)  Upper Extremity Assessment: LUE deficits/detail, Generalized weakness LUE Deficits / Details: LUE grossly 2-/5, grip strength 1/5, thumb opposition intact, impaired  FTN, decreased speed/accuracy with movement, shoulder flexion <1/4 full ROM, wrist flexion/extension 3/4 full ROM, elbow flexion full ROM (assistance to prevent UE from falling back onto face), impaired LT sensation LUE Sensation: decreased light touch LUE Coordination: decreased gross motor, decreased fine motor  Lower Extremity Assessment: Defer to PT evaluation LLE Deficits / Details: Weakness LLE Sensation: decreased light touch LLE Coordination: decreased fine motor, decreased gross motor    ADLs  Overall ADL's : Needs assistance/impaired Grooming: Sitting, Wash/dry face Grooming Details (indicate cue type and reason): Pt cued to wring out washcloth into kidney basin using BUE which he completed without dropping washcloth. When cued to wash his face he initiated with RUE requiring VC to utilize LUE which he was able to do but did not have enough strength/coordination in LUE to complete fully to help remove dead skin from his beard. CGA- MIN A for sitting balance from PT throughout. Lower Body Dressing: Moderate assistance, Sitting/lateral leans Lower Body Dressing Details (indicate cue type and reason): for socks, assistance required to maintain dynamic sitting balance    Mobility  Overal bed mobility: Needs Assistance Bed Mobility: Supine to Sit Supine to sit: Mod assist General bed mobility comments: NT, pt received/left sitting in recliner    Transfers  Overall transfer level: Needs assistance Equipment used: Rolling walker (2 wheels), None Transfers: Sit to/from Stand, Bed to chair/wheelchair/BSC Sit to Stand: Mod assist, +2 physical assistance Bed to/from chair/wheelchair/BSC transfer type:: Step pivot Step pivot transfers: Min assist, +2 physical assistance General transfer comment: pt able to take good steps today with HHA+2 stand pivot to chair.  overall improved from yesterday but was hesitant to step with walker    Ambulation / Gait / Stairs / Wheelchair Mobility   Ambulation/Gait General Gait Details: deferred    Posture / Balance Dynamic Sitting Balance Sitting balance - Comments: cga to min a for sitting balance while doing ADL tasks with OT Balance Overall balance assessment: Needs assistance Sitting-balance support: Bilateral upper extremity supported, Feet supported, No upper extremity supported Sitting balance-Leahy Scale: Poor Sitting balance - Comments: cga to min a for sitting balance while doing ADL tasks with OT Postural control: Posterior lean Standing balance support: Bilateral upper extremity supported Standing balance-Leahy Scale: Poor Standing balance comment: +2 hands on assist   for safety and assist    Special needs/care consideration Diabetic management yes   Previous Home Environment (from acute therapy documentation) Living Arrangements: Children (daughter) Available Help at Discharge: Family, Available 24 hours/day Type of Home: House Home Layout: One level Home Access: Stairs to enter Entrance Stairs-Rails: Can reach both Entrance Stairs-Number of Steps: 3 Bathroom Shower/Tub: Tub/shower unit Bathroom Toilet: Standard Home Care Services: No  Discharge Living Setting Plans for Discharge Living Setting: Lives with (comment) (daughter, [name]) Type of Home at Discharge: House Discharge Home Layout: One level Discharge Home Access: Stairs to enter Entrance Stairs-Rails: Can reach both Entrance Stairs-Number of Steps: 3 Discharge Bathroom Shower/Tub: Tub/shower unit Discharge Bathroom Toilet: Standard Discharge Bathroom Accessibility: Yes How Accessible: Accessible via walker Does the patient have any problems obtaining your medications?: No  Social/Family/Support Systems Anticipated Caregiver: daughter, [name] Galvan (spouse Colleen to return to [place] before CIR discharge) Anticipated Caregiver's Contact Information: [name] [phone] Ability/Limitations of Caregiver: [name] works 5pm-9pm Caregiver  Availability: Other (Comment) (see above) Discharge Plan Discussed with Primary Caregiver: Yes Is Caregiver In Agreement with Plan?: Yes Does Caregiver/Family have Issues with Lodging/Transportation while Pt is in Rehab?: No  Goals Patient/Family Goal for Rehab: PT/OT supervision to mod I; SLP n/a Expected length of stay: 12-14 days Additional Information: Discharge plan: to daughter's home, she can provide supervision except during work hours 5pm-9pm Pt/Family Agrees to Admission and willing to participate: Yes Program Orientation Provided & Reviewed with Pt/Caregiver Including Roles  & Responsibilities: Yes Additional Information Needs: Medicare Part A only  Barriers to Discharge: Decreased caregiver support  Decrease burden of Care through IP rehab admission: n/a  Possible need for SNF placement upon discharge: Not anticipated.  Plan to discharge to daughter's home ([name]) at intermittent mod I level.    Patient Condition: I have reviewed medical records from [facility], spoken with  TOC , and patient and daughter. I discussed via phone for inpatient rehabilitation assessment.  Patient will benefit from ongoing PT and OT, can actively participate in 3 hours of therapy a day 5 days of the week, and can make measurable gains during the admission.  Patient will also benefit from the coordinated team approach during an Inpatient Acute Rehabilitation admission.  The patient will receive intensive therapy as well as Rehabilitation physician, nursing, social worker, and care management interventions.  Due to safety, disease management, medication administration, pain management, and patient education the patient requires 24 hour a day rehabilitation nursing.  The patient is currently mod assist with mobility and basic ADLs.  Discharge setting and therapy post discharge at home with home health is anticipated.  Patient has agreed to participate in the Acute Inpatient Rehabilitation Program and will admit  today.  Preadmission Screen Completed By:  Laurann Mcmorris E Ezio Wieck, PT, DPT 02/20/2023 10:23 AM ______________________________________________________________________   Discussed status with Dr. Lovorn on 02/20/23 at 10:23 AM and received approval for admission today.  Admission Coordinator:  Keyler Hoge E Nabilah Davoli, PT, DPT time 10:23 AM /Date 02/20/23    Assessment/Plan: Diagnosis: R cerebral peduncle stroke with L greater trochanter hip fx.  Does the need for close, 24 hr/day Medical supervision in concert with the patient's rehab needs make it unreasonable for this patient to be served in a less intensive setting? Yes Co-Morbidities requiring supervision/potential complications: HTN, DM, CKD, prior CVA, PVD, CAD/MI Due to bladder management, bowel management, safety, skin/wound care, disease management, medication administration, pain management, and patient education, does the patient require 24 hr/day rehab nursing? Yes Does the patient require   coordinated care of a physician, rehab nurse, PT, OT, and SLP to address physical and functional deficits in the context of the above medical diagnosis(es)? Yes Addressing deficits in the following areas: balance, endurance, locomotion, strength, transferring, bowel/bladder control, bathing, dressing, feeding, grooming, and toileting Can the patient actively participate in an intensive therapy program of at least 3 hrs of therapy 5 days a week? Yes The potential for patient to make measurable gains while on inpatient rehab is good Anticipated functional outcomes upon discharge from inpatient rehab: modified independent and supervision PT, modified independent and supervision OT, n/a SLP Estimated rehab length of stay to reach the above functional goals is: 12-14 days Anticipated discharge destination: Home 10. Overall Rehab/Functional Prognosis: good   MD Signature:  

## 2023-02-20 NOTE — Progress Notes (Signed)
Occupational Therapy Treatment Patient Details Name: [name] MRN: [id number] DOB: 1951/03/17 Today's Date: 02/20/2023   History of present illness As per EMR: [name] [name] is a 72 y.o. male with multiple medical issues including coronary artery disease, status post MI, diabetes, chronic kidney disease, peripheral vascular disease, multiple TIAs/strokes, peripheral neuropathy, hyperlipidemia, and hypertension who normally lives independent.  The patient states that he was walking in the yard but, because of his underlying neuropathy, was unable to feel his feet or keep his balance and he fell forward landing on his flexed left knee and left side.  He presented to the emergency room complaining of left hip pain as well as chest pain and so has been admitted for further workup and pain control.  The patient denies any associated injuries, although he cannot recall whether he may have actually struck his head or not.  He denies any loss of consciousness and denies any lightheadedness, dizziness, chest pain, shortness of breath, or other symptoms which may have precipitated his fall.  Initial x-rays of his pelvis and left hip were unremarkable for fracture.  However, a subsequent CT scan of his pelvis demonstrated a nondisplaced fracture of the left greater trochanter, prompting orthopedic consultation.  CT brain revealed  Possible acute nonhemorrhagic punctate infarct in the  anteromedial aspect of the right cerebral peduncle. No T2 or FLAIR  signal abnormality is associated. This may be artifact   OT comments  Pt seen for OT tx. While seated EOB, pt cued to wring out washcloth into kidney basin using BUE which he completed with improved grip with LUE today as compared to previous session. Pt also maintained static sitting balance with and without RUE support on EOB and supv-SBA to wash his face with his L hand (some decr [facility]MC noted). He still was not fully able to scrub away dead skin from his beard.  He was able to comb his beard to help release dead skin with R hand and used L hand to pull hair/debri from the comb without LOB despite no UE support and required MIN A for thoroughness. Pt completed STS and lateral small steps with MIN A +2 and assist to advance LLE. Overall pt demonstrating improvements from previous date's session. Continue with POC.     Recommendations for follow up therapy are one component of a multi-disciplinary discharge planning process, led by the attending physician.  Recommendations may be updated based on patient status, additional functional criteria and insurance authorization.    Assistance Recommended at Discharge Frequent or constant Supervision/Assistance  Patient can return home with the following  Two people to help with walking and/or transfers;Assistance with cooking/housework;Assist for transportation;Help with stairs or ramp for entrance;Direct supervision/assist for financial management;Direct supervision/assist for medications management;A lot of help with bathing/dressing/bathroom   Equipment Recommendations  Other (comment) (defer to next venue)    Recommendations for Other Services      Precautions / Restrictions Precautions Precautions: Fall Restrictions Weight Bearing Restrictions: Yes LLE Weight Bearing: Weight bearing as tolerated       Mobility Bed Mobility Overal bed mobility: Needs Assistance Bed Mobility: Sit to Supine       Sit to supine: Mod assist   General bed mobility comments: Pt recieved in transition from sup>sit EOB with PT upon OT arrival. MOD A for BLE mgt to return to bed    Transfers Overall transfer level: Needs assistance Equipment used: Rolling walker (2 wheels) Transfers: Sit to/from Stand, Bed to chair/wheelchair/BSC Sit  to Stand: Min assist, +2 physical assistance           General transfer comment: Pt required assist for advancing LLE to the left as he took small shuffled side steps along the EOB  with MIN A +2 to complete. Required brief seated rest break 2/2 dizziness.     Balance Overall balance assessment: Needs assistance Sitting-balance support: Single extremity supported, No upper extremity supported, Feet supported Sitting balance-Leahy Scale: Fair Sitting balance - Comments: supv-SBA for static sitting balance with EOB ADL   Standing balance support: Bilateral upper extremity supported Standing balance-Leahy Scale: Poor                             ADL either performed or assessed with clinical judgement   ADL Overall ADL's : Needs assistance/impaired     Grooming: Sitting;Wash/dry face;Brushing hair;Set up;Minimal assistance Grooming Details (indicate cue type and reason): Pt cued to wring out washcloth into kidney basin using BUE which he completed with improved grip with LUE today as compared to previous session. Pt maintained static sitting balance with RUE support on EOB and supv-SBA to wash his face with his L hand (some decr FMC noted). He still was not fully able to scrub away dead skin from his beard. He was able to comb his beard to help release dead skin  with R hand and used L hand to pull hair/debri from the comb without LOB despite no UE support.                                    Extremity/Trunk Assessment              Vision       Perception     Praxis      Cognition Arousal/Alertness: Awake/alert Behavior During Therapy: WFL for tasks assessed/performed, Flat affect Overall Cognitive Status: Within Functional Limits for tasks assessed                                          Exercises      Shoulder Instructions       General Comments      Pertinent Vitals/ Pain       Pain Assessment Pain Assessment: Faces Faces Pain Scale: Hurts even more Pain Location: L hip with bed mobility Pain Descriptors / Indicators: Moaning, Grimacing Pain Intervention(s): Limited activity within patient's  tolerance, Monitored during session, Repositioned  Home Living                                          Prior Functioning/Environment              Frequency  Min 3X/week        Progress Toward Goals  OT Goals(current goals can now be found in the care plan section)  Progress towards OT goals: Progressing toward goals  Acute Rehab OT Goals Patient Stated Goal: return to PLOF OT Goal Formulation: With patient Time For Goal Achievement: 03/04/23 Potential to Achieve Goals: Good  Plan Discharge plan remains appropriate;Frequency remains appropriate    Co-evaluation  AM-PAC OT "6 Clicks" Daily Activity     Outcome Measure   Help from another person eating meals?: A Little Help from another person taking care of personal grooming?: A Little Help from another person toileting, which includes using toliet, bedpan, or urinal?: A Lot Help from another person bathing (including washing, rinsing, drying)?: A Lot Help from another person to put on and taking off regular upper body clothing?: A Lot Help from another person to put on and taking off regular lower body clothing?: A Lot 6 Click Score: 14    End of Session Equipment Utilized During Treatment: Rolling walker (2 wheels)  OT Visit Diagnosis: Unsteadiness on feet (R26.81);Repeated falls (R29.6);Muscle weakness (generalized) (M62.81);Pain;Hemiplegia and hemiparesis Hemiplegia - Right/Left: Left Hemiplegia - dominant/non-dominant: Non-Dominant Pain - Right/Left: Left Pain - part of body: Hip   Activity Tolerance Patient tolerated treatment well   Patient Left in bed;with call bell/phone within reach;with bed alarm set   Nurse Communication          Time: 1010-1020 OT Time Calculation (min): 10 min  Charges: OT General Charges $OT Visit: 1 Visit OT Treatments $Self Care/Home Management : 8-22 mins  Arman Filter., MPH, MS, OTR/L ascom [phone] 02/20/23, 10:34 AM

## 2023-02-20 NOTE — H&P (Signed)
Physical Medicine and Rehabilitation Admission H&P   CC: Functional deficits secondary to small infarct of the R cerebral peduncle.   HPI: [name] is a 72 year old male who presented to the ED at [facility] who fell to ground after his left leg gave way. He was walking home and developed BLE weakness and fell in his yard. He complained of left hip pain. Workup revealed WBC 14.5, BP 153/91, HR 103. CT head and chest performed and without acute abnormality. CT of left hip with minimally displaced left greater trochanter fracture. Orthopedic surgery consulted and managed non-operatively. He is WBAT with a walker. He has a history of DM and BLE peripheral neuropathy. History of prior stroke on aspirin. MRI performed showed probable acute ischemic punctate infarct in the anteromedial aspect of the right cerebral peduncle, artifact could not be ruled out given that there is no associated FLAIR signal abnormality. Neurology consulted and placed on Plavix as well as aspirin. DAPT for 21 days followed by Plavix alone. A1c 13.1%. Diabetic coordinator consulted for uncontrolled diabetes. Hypertension history at home on Hyzaar and verapamil. 2D echo with EF ~60-65%. He is tolerating a regular diet. The patient requires inpatient medicine and rehabilitation evaluations and services for ongoing dysfunction secondary to left greater trochanter fracture and small infarct of the R cerebral peduncle.   Pt reports LBM 2 days ago- denies constipation Voiding well Cannot put weight on LLE secondary to pain and weakness.  Chest/sternal pain - not cardiac- from fall/bruising- 8-9/10- doesn't ant to take pain meds, but willing to take tylenol- likes BC powders, but explained with the stroke, we don't want to give BC powders.    Review of Systems  Constitutional:  Positive for malaise/fatigue. Negative for chills and fever.  HENT: Negative.    Eyes: Negative.  Negative for blurred vision, double vision and photophobia.   Respiratory:  Negative for sputum production.   Cardiovascular:  Negative for orthopnea and leg swelling.       Noncardiac sternal pain due to bruising from fall  Gastrointestinal:  Negative for abdominal pain, constipation, diarrhea, nausea and vomiting.  Genitourinary:  Negative for dysuria, frequency and urgency.  Musculoskeletal:  Positive for falls and joint pain. Negative for back pain and neck pain.  Skin: Negative.   Neurological:  Positive for sensory change and focal weakness. Negative for seizures and headaches.  Endo/Heme/Allergies: Negative.   Psychiatric/Behavioral:  Negative for substance abuse and suicidal ideas. The patient has insomnia.   All other systems reviewed and are negative.  Past Medical History:  Diagnosis Date   Acute cerebral infarction (HCC) 11/2005   right caudate internal capsule secondary to small vessel   CAD (coronary artery disease)    s/p MI in distant past (1995?); TTE 2010 - EF 55-60%   Chronic kidney disease    per pt, had kidney disease 43-58 years old   Diabetes mellitus without complication (HCC)    Hyperlipidemia    Hypertension    fluctuates   Lacunar infarction (HCC) 2006/2007   Right basal ganglion, chronic lacunar infarct   Myocardial infarction [facility])    ?1995   SBO (small bowel obstruction) (HCC) 09/2010   Resolved with conservative measures/ per pt has had 2 SBO!   Stenosis of right carotid artery    Stroke [facility]) 2010   TIA (transient ischemic attack) 06/2009   Past Surgical History:  Procedure Laterality Date   broken collarbone     right collarbone   broken shoulder  right shoulder   ESOPHAGOGASTRODUODENOSCOPY N/A 12/21/2019   Procedure: ESOPHAGOGASTRODUODENOSCOPY (EGD);  Surgeon: Toledo, Boykin Nearing, MD;  Location: [facility] ENDOSCOPY;  Service: Gastroenterology;  Laterality: N/A;   FINGER SURGERY     right hand, tip of finger mashed and broken fingers   LAPAROSCOPIC CHOLECYSTECTOMY  2009   by Dr. Derrell Lolling   Family History   Problem Relation Age of Onset   Lung cancer Mother    Emphysema Mother    Heart failure Father    Emphysema Sister    Social History:  reports that he has never smoked. He has never used smokeless tobacco. He reports that he does not drink alcohol and does not use drugs. Allergies:  Allergies  Allergen Reactions   Bee Venom Anaphylaxis   Penicillins Anaphylaxis   Medications Prior to Admission  Medication Sig Dispense Refill   gabapentin (NEURONTIN) 600 MG tablet Take 600 mg by mouth at bedtime.     insulin detemir (LEVEMIR FLEXPEN) 100 UNIT/ML FlexPen Inject 50 Units into the skin 2 (two) times daily.     liraglutide (VICTOZA) 18 MG/3ML SOPN Inject 1.8 mg into the skin daily.     losartan-hydrochlorothiazide (HYZAAR) 100-25 MG tablet Take 1 tablet by mouth daily.     metFORMIN (GLUCOPHAGE) 1000 MG tablet Take 1,000 mg by mouth 2 (two) times daily.  3   ondansetron (ZOFRAN-ODT) 4 MG disintegrating tablet Take 1 tablet (4 mg total) by mouth every 8 (eight) hours as needed for nausea or vomiting. 20 tablet 0   rosuvastatin (CRESTOR) 10 MG tablet Take 10 mg by mouth at bedtime.     verapamil (VERELAN PM) 240 MG 24 hr capsule Take 240 mg by mouth daily.   5   aspirin EC 81 MG tablet Take 81 mg by mouth daily. For pain (Patient not taking: Reported on 02/16/2023)     BD PEN NEEDLE MICRO U/F 32G X 6 MM MISC Use pen needles for insulin injection each evening  11   meloxicam (MOBIC) 15 MG tablet Take 15 mg by mouth daily. (Patient not taking: Reported on 02/16/2023)  3   sildenafil (REVATIO) 20 MG tablet TAKE 1 TABLET BY MOUTH prior TO sexual activity prn  3      Home: Home Living Family/patient expects to be discharged to:: Private residence Living Arrangements: Children (daughter) Available Help at Discharge: Family, Available 24 hours/day Type of Home: House Home Access: Stairs to enter [employer] of Steps: 3 Entrance Stairs-Rails: Can reach both Home Layout: One  level Bathroom Shower/Tub: Engineer, manufacturing systems: Standard Home Equipment: None   Functional History: Prior Function Prior Level of Function : Independent/Modified Independent Mobility Comments: Independent ambulator at hosuehold level and community level activity participation. ADLs Comments: Ind  Functional Status:  Mobility: Bed Mobility Overal bed mobility: Needs Assistance Bed Mobility: Supine to Sit Supine to sit: Mod assist General bed mobility comments: NT, pt received/left sitting in recliner Transfers Overall transfer level: Needs assistance Equipment used: Rolling walker (2 wheels), None Transfers: Sit to/from Stand, Bed to chair/wheelchair/BSC Sit to Stand: Mod assist, +2 physical assistance Bed to/from chair/wheelchair/BSC transfer type:: Step pivot Step pivot transfers: Min assist, +2 physical assistance General transfer comment: pt able to take good steps today with HHA+2 stand pivot to chair.  overall improved from yesterday but was hesitant to step with walker Ambulation/Gait General Gait Details: deferred    ADL: ADL Overall ADL's : Needs assistance/impaired Grooming: Sitting, Wash/dry face Grooming Details (indicate cue type and reason): Pt  cued to wring out washcloth into kidney basin using BUE which he completed without dropping washcloth. When cued to wash his face he initiated with RUE requiring VC to utilize LUE which he was able to do but did not have enough strength/coordination in LUE to complete fully to help remove dead skin from his beard. CGA- MIN A for sitting balance from PT throughout. Lower Body Dressing: Moderate assistance, Sitting/lateral leans Lower Body Dressing Details (indicate cue type and reason): for socks, assistance required to maintain dynamic sitting balance  Cognition: Cognition Overall Cognitive Status: Within Functional Limits for tasks assessed Orientation Level: Oriented X4 Cognition Arousal/Alertness:  Awake/alert Behavior During Therapy: WFL for tasks assessed/performed, Flat affect Overall Cognitive Status: Within Functional Limits for tasks assessed General Comments: Pt oriented to self, Sunday. Stated current date is March 24th, 2024 then changed to May. Slow processing  Physical Exam: Blood pressure (!) 140/84, pulse 89, temperature 97.9 F (36.6 C), resp. rate 19, height 5\' 4"  (1.626 m), weight 64.4 kg, SpO2 92 %. Physical Exam Vitals and nursing note reviewed.  Constitutional:      General: He is not in acute distress.    Appearance: He is ill-appearing.     Comments: Pt appears frail;  sitting up in bed; appears older than stated age; awake, alert, appropriate, sitting up eating lunch, NAD  HENT:     Head: Normocephalic.     Comments: Abrasion on top of head with dried blood due to dog making him fall into doghouse No facial droop seen Tongue midline Severe eczema seen on face with associated redness and peeling- esp in butterfly distribution Cradle cap seen on head    Right Ear: External ear normal.     Left Ear: External ear normal.     Nose: Nose normal. No congestion.     Mouth/Throat:     Mouth: Mucous membranes are moist.     Pharynx: Oropharynx is clear. No oropharyngeal exudate.  Eyes:     General:        Right eye: No discharge.        Left eye: No discharge.     Extraocular Movements: Extraocular movements intact.  Cardiovascular:     Rate and Rhythm: Normal rate and regular rhythm.     Heart sounds: Normal heart sounds. No murmur heard.    No gallop.     Comments: Sternal noncardiac TTP- some associated bruising- no obvious abrasions Pulmonary:     Effort: Pulmonary effort is normal. No respiratory distress.     Breath sounds: Normal breath sounds. No wheezing or rales.  Abdominal:     General: Bowel sounds are normal. There is no distension.     Palpations: Abdomen is soft.     Tenderness: There is no abdominal tenderness.     Comments: normoactive   Musculoskeletal:     Cervical back: Neck supple. No tenderness.     Comments: RUE 5-/5 in biceps, triceps, WE, grip and FA LUE- 4/5 in same muscles- FA 4-/5 RLE- 5-/5 in HF, KE, DF and PF LLE- HF 1 to 2-/5; DF 3-/5 and PF 3-/5- said too painful to do knee movement  Skin:    General: Skin is warm and dry.     Comments: Peeling on face and head with cradle cap/psoriasis? On head Balding IV L forearm looks OK R AC fossa bruise from prior IV/IV stick Bruise on sternum from fall  Neurological:     Mental Status: He is alert.  Comments: Decreased to light touch on L side LUE/LLE Slightly delayed responses Ox3 with cues  Psychiatric:     Comments: Flat, focused on food     Results for orders placed or performed during the hospital encounter of 02/16/23 (from the past 48 hour(s))  Glucose, capillary     Status: Abnormal   Collection Time: 02/18/23 11:34 AM  Result Value Ref Range   Glucose-Capillary 223 (H) 70 - 99 mg/dL    Comment: Glucose reference range applies only to samples taken after fasting for at least 8 hours.  Glucose, capillary     Status: Abnormal   Collection Time: 02/18/23  4:17 PM  Result Value Ref Range   Glucose-Capillary 236 (H) 70 - 99 mg/dL    Comment: Glucose reference range applies only to samples taken after fasting for at least 8 hours.  Glucose, capillary     Status: Abnormal   Collection Time: 02/18/23  9:16 PM  Result Value Ref Range   Glucose-Capillary 215 (H) 70 - 99 mg/dL    Comment: Glucose reference range applies only to samples taken after fasting for at least 8 hours.   Comment 1 Notify RN   Glucose, capillary     Status: Abnormal   Collection Time: 02/19/23  8:07 AM  Result Value Ref Range   Glucose-Capillary 68 (L) 70 - 99 mg/dL    Comment: Glucose reference range applies only to samples taken after fasting for at least 8 hours.  Glucose, capillary     Status: Abnormal   Collection Time: 02/19/23 11:46 AM  Result Value Ref Range    Glucose-Capillary 108 (H) 70 - 99 mg/dL    Comment: Glucose reference range applies only to samples taken after fasting for at least 8 hours.  Glucose, capillary     Status: Abnormal   Collection Time: 02/19/23  5:09 PM  Result Value Ref Range   Glucose-Capillary 156 (H) 70 - 99 mg/dL    Comment: Glucose reference range applies only to samples taken after fasting for at least 8 hours.  Glucose, capillary     Status: Abnormal   Collection Time: 02/19/23  9:23 PM  Result Value Ref Range   Glucose-Capillary 165 (H) 70 - 99 mg/dL    Comment: Glucose reference range applies only to samples taken after fasting for at least 8 hours.   Comment 1 Notify RN   Glucose, capillary     Status: Abnormal   Collection Time: 02/20/23  7:55 AM  Result Value Ref Range   Glucose-Capillary 101 (H) 70 - 99 mg/dL    Comment: Glucose reference range applies only to samples taken after fasting for at least 8 hours.   CT ANGIO HEAD NECK W WO CM  Result Date: 02/18/2023 CLINICAL DATA:  Acute infarct on recent MRI EXAM: CT ANGIOGRAPHY HEAD AND NECK WITH AND WITHOUT CONTRAST TECHNIQUE: Multidetector CT imaging of the head and neck was performed using the standard protocol during bolus administration of intravenous contrast. Multiplanar CT image reconstructions and MIPs were obtained to evaluate the vascular anatomy. Carotid stenosis measurements (when applicable) are obtained utilizing NASCET criteria, using the distal internal carotid diameter as the denominator. RADIATION DOSE REDUCTION: This exam was performed according to the departmental dose-optimization program which includes automated exposure control, adjustment of the mA and/or kV according to patient size and/or use of iterative reconstruction technique. CONTRAST:  75mL OMNIPAQUE IOHEXOL 350 MG/ML SOLN COMPARISON:  No prior CTA available, correlation is made with CT head 02/16/2023 and  MRI head 02/17/2023 FINDINGS: CT HEAD FINDINGS Brain: No evidence of acute  infarct, hemorrhage, mass, mass effect, or midline shift. No hydrocephalus or extra-axial fluid collection. Periventricular white matter changes, likely the sequela of chronic small vessel ischemic disease. Remote lacunar infarcts in the right basal ganglia. No definite hypodensity is seen in the right cerebral peduncle to correlate with the acute infarct on the 02/17/2023 MRI. Vascular: No hyperdense vessel. Atherosclerotic calcifications in the intracranial carotid and vertebral arteries. Skull: Negative for fracture or focal lesion. Sinuses/Orbits: No acute finding. Other: The mastoid air cells are well aerated. CTA NECK FINDINGS Aortic arch: Standard branching. Imaged portion shows no evidence of aneurysm or dissection. No significant stenosis of the major arch vessel origins. Mild aortic atherosclerosis. Right carotid system: No evidence of dissection, occlusion, or hemodynamically significant stenosis (greater than 50%). Atherosclerotic disease at the bifurcation and in the proximal ICA is not hemodynamically significant. Left carotid system: No evidence of dissection, occlusion, or hemodynamically significant stenosis (greater than 50%). Atherosclerotic disease at the bifurcation and in the proximal ICA is not hemodynamically significant. Vertebral arteries: No evidence of dissection, occlusion, or hemodynamically significant stenosis (greater than 50%). Skeleton: No acute osseous abnormality. Degenerative changes in the cervical spine. Other neck: No acute finding. Upper chest: No focal pulmonary opacity or pleural effusion. Review of the MIP images confirms the above findings CTA HEAD FINDINGS Anterior circulation: Both internal carotid arteries are patent to the termini, with mild stenosis in the bilateral cavernous and supraclinoid segments. A1 segments patent. Normal anterior communicating artery. Anterior cerebral arteries are patent to their distal aspects without significant stenosis. Mild stenosis in  the left M1 (series 10, image 221). Moderate stenosis in the distal right M1 (series 10, images 222-223). MCA branches are irregular but perfused to their distal aspects with no more than mild focal stenosis on the right (for example, series 10, image 172, 187) Posterior circulation: Vertebral arteries patent to the vertebrobasilar junction with mild stenosis in the right V4 (series 10, image 271 and 304). Posterior inferior cerebellar arteries patent proximally. Basilar is irregular but patent to its distal aspect without significant stenosis. Superior cerebellar arteries patent proximally. Patent P1 segments. PCAs are irregular but patent through the P 2 segments without significant stenosis. Evaluation of the more distal PCAs is limited by venous contamination. The bilateral posterior communicating arteries are not visualized. Venous sinuses: As permitted by contrast timing, patent. Anatomic variants: None significant. Review of the MIP images confirms the above findings IMPRESSION: 1. No acute intracranial process. Remote lacunar infarcts in the right basal ganglia. 2. No intracranial large vessel occlusion. Mild stenosis in the bilateral cavernous and supraclinoid ICAs. Moderate stenosis in the distal right M1 and mild stenosis in the left M1 and right V4. 3. No hemodynamically significant stenosis in the neck. 4. Aortic atherosclerosis. Aortic Atherosclerosis (ICD10-I70.0). Electronically Signed   By: Wiliam Ke M.D.   On: 02/18/2023 15:24      Blood pressure (!) 140/84, pulse 89, temperature 97.9 F (36.6 C), resp. rate 19, height 5\' 4"  (1.626 m), weight 64.4 kg, SpO2 92 %.  Medical Problem List and Plan: 1. 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  Antithrombotics: -DVT/anticoagulation:  Pharmaceutical: Lovenox  -antiplatelet therapy: Aspirin and Plavix for three weeks followed by  Plavix alone  3. Pain Management: Tylenol, oxycodone as needed- pt doesn't want "pain meds' but willing to try  Tylenol- pain 8-9/10- but explained cannot do BC powders  4. Mood/Behavior/Sleep: LCSW to evaluate and provide emotional support  -antipsychotic agents: n/a  5. Neuropsych/cognition: This patient is capable of making decisions on his own behalf.  6. Skin/Wound Care: Routine skin care checks   7. Fluids/Electrolytes/Nutrition: Routine Is and Os and follow-up chemistries  8: Hypertension: monitor TID and prn (home: Hyzaar 100/25 daily, verapamil 240 mg daily)  -controlled currently on no meds  9: Hyperlipidemia: continue statin  10: Peripheral neuropathy: continue gabapentin 600 mg q HS  11:Left greater trochanter fracture: non-operative  -WBAT with a walker  -follow-up with Dr. Joice Lofts  12: DM-2: CBGs QID, carb modified diet; home: (Levemir 50 units BID; Victoza 1.8 mg daily, metformin 1000 mg BID) A1c 13.1%  -continue SSI  -continue Novolog 5 units with meals  -continue Levemir 30 units in AM, 20 units at bedtime  -suggest bringing Victoza in from home.  13: Hyponatremia: follow-up BMP  14: Elevated bilirubin: follow-up CMP  15: Vitamin D deficiency: weekly supplementation for 8 weeks     Milinda Antis, PA-C 02/20/2023   I have personally performed a face to face diagnostic evaluation of this patient and formulated the key components of the plan.  Additionally, I have personally reviewed laboratory data, imaging studies, as well as relevant notes and concur with the physician assistant's documentation above.   The patient's status has not changed from the original H&P.  Any changes in documentation from the acute care chart have been noted above.

## 2023-02-20 NOTE — H&P (Signed)
Physical Medicine and Rehabilitation Admission H&P     CC: Functional deficits secondary to small infarct of the R cerebral peduncle.    HPI: [name] is a 72 year old male who presented to the ED at [facility] who fell to ground after his left leg gave way. He was walking home and developed BLE weakness and fell in his yard. He complained of left hip pain. Workup revealed WBC 14.5, BP 153/91, HR 103. CT head and chest performed and without acute abnormality. CT of left hip with minimally displaced left greater trochanter fracture. Orthopedic surgery consulted and managed non-operatively. He is WBAT with a walker. He has a history of DM and BLE peripheral neuropathy. History of prior stroke on aspirin. MRI performed showed probable acute ischemic punctate infarct in the anteromedial aspect of the right cerebral peduncle, artifact could not be ruled out given that there is no associated FLAIR signal abnormality. Neurology consulted and placed on Plavix as well as aspirin. DAPT for 21 days followed by Plavix alone. A1c 13.1%. Diabetic coordinator consulted for uncontrolled diabetes. Hypertension history at home on Hyzaar and verapamil. 2D echo with EF ~60-65%. He is tolerating a regular diet. The patient requires inpatient medicine and rehabilitation evaluations and services for ongoing dysfunction secondary to left greater trochanter fracture and small infarct of the R cerebral peduncle.     Pt reports LBM 2 days ago- denies constipation Voiding well Cannot put weight on LLE secondary to pain and weakness.  Chest/sternal pain - not cardiac- from fall/bruising- 8-9/10- doesn't ant to take pain meds, but willing to take tylenol- likes BC powders, but explained with the stroke, we don't want to give BC powders.      Review of Systems  Constitutional:  Positive for malaise/fatigue. Negative for chills and fever.  HENT: Negative.    Eyes: Negative.  Negative for blurred vision, double vision and photophobia.   Respiratory:  Negative for sputum production.   Cardiovascular:  Negative for orthopnea and leg swelling.       Noncardiac sternal pain due to bruising from fall  Gastrointestinal:  Negative for abdominal pain, constipation, diarrhea, nausea and vomiting.  Genitourinary:  Negative for dysuria, frequency and urgency.  Musculoskeletal:  Positive for falls and joint pain. Negative for back pain and neck pain.  Skin: Negative.   Neurological:  Positive for sensory change and focal weakness. Negative for seizures and headaches.  Endo/Heme/Allergies: Negative.   Psychiatric/Behavioral:  Negative for substance abuse and suicidal ideas. The patient has insomnia.   All other systems reviewed and are negative.       Past Medical History:  Diagnosis Date   Acute cerebral infarction (HCC) 11/2005    right caudate internal capsule secondary to small vessel   CAD (coronary artery disease)      s/p MI in distant past (1995?); TTE 2010 - EF 55-60%   Chronic kidney disease      per pt, had kidney disease 53-48 years old   Diabetes mellitus without complication (HCC)     Hyperlipidemia     Hypertension      fluctuates   Lacunar infarction (HCC) 2006/2007    Right basal ganglion, chronic lacunar infarct   Myocardial infarction [facility])      ?1995   SBO (small bowel obstruction) (HCC) 09/2010    Resolved with conservative measures/ per pt has had 2 SBO!   Stenosis of right carotid artery     Stroke [facility]) 2010   TIA (transient ischemic attack)  06/2009         Past Surgical History:  Procedure Laterality Date   broken collarbone        right collarbone   broken shoulder        right shoulder   ESOPHAGOGASTRODUODENOSCOPY N/A 12/21/2019    Procedure: ESOPHAGOGASTRODUODENOSCOPY (EGD);  Surgeon: Toledo, Boykin Nearing, MD;  Location: [facility] ENDOSCOPY;  Service: Gastroenterology;  Laterality: N/A;   FINGER SURGERY        right hand, tip of finger mashed and broken fingers   LAPAROSCOPIC CHOLECYSTECTOMY    2009    by Dr. Derrell Lolling         Family History  Problem Relation Age of Onset   Lung cancer Mother     Emphysema Mother     Heart failure Father     Emphysema Sister      Social History:  reports that he has never smoked. He has never used smokeless tobacco. He reports that he does not drink alcohol and does not use drugs. Allergies:      Allergies  Allergen Reactions   Bee Venom Anaphylaxis   Penicillins Anaphylaxis          Medications Prior to Admission  Medication Sig Dispense Refill   gabapentin (NEURONTIN) 600 MG tablet Take 600 mg by mouth at bedtime.       insulin detemir (LEVEMIR FLEXPEN) 100 UNIT/ML FlexPen Inject 50 Units into the skin 2 (two) times daily.       liraglutide (VICTOZA) 18 MG/3ML SOPN Inject 1.8 mg into the skin daily.       losartan-hydrochlorothiazide (HYZAAR) 100-25 MG tablet Take 1 tablet by mouth daily.       metFORMIN (GLUCOPHAGE) 1000 MG tablet Take 1,000 mg by mouth 2 (two) times daily.   3   ondansetron (ZOFRAN-ODT) 4 MG disintegrating tablet Take 1 tablet (4 mg total) by mouth every 8 (eight) hours as needed for nausea or vomiting. 20 tablet 0   rosuvastatin (CRESTOR) 10 MG tablet Take 10 mg by mouth at bedtime.       verapamil (VERELAN PM) 240 MG 24 hr capsule Take 240 mg by mouth daily.    5   aspirin EC 81 MG tablet Take 81 mg by mouth daily. For pain (Patient not taking: Reported on 02/16/2023)       BD PEN NEEDLE MICRO U/F 32G X 6 MM MISC Use pen needles for insulin injection each evening   11   meloxicam (MOBIC) 15 MG tablet Take 15 mg by mouth daily. (Patient not taking: Reported on 02/16/2023)   3   sildenafil (REVATIO) 20 MG tablet TAKE 1 TABLET BY MOUTH prior TO sexual activity prn   3          Home: Home Living Family/patient expects to be discharged to:: Private residence Living Arrangements: Children (daughter) Available Help at Discharge: Family, Available 24 hours/day Type of Home: House Home Access: Stairs to enter [employer] of Steps: 3 Entrance Stairs-Rails: Can reach both Home Layout: One level Bathroom Shower/Tub: Engineer, manufacturing systems: Standard Home Equipment: None   Functional History: Prior Function Prior Level of Function : Independent/Modified Independent Mobility Comments: Independent ambulator at hosuehold level and community level activity participation. ADLs Comments: Ind   Functional Status:  Mobility: Bed Mobility Overal bed mobility: Needs Assistance Bed Mobility: Supine to Sit Supine to sit: Mod assist General bed mobility comments: NT, pt received/left sitting in recliner Transfers Overall transfer level: Needs assistance Equipment used:  Rolling walker (2 wheels), None Transfers: Sit to/from Stand, Bed to chair/wheelchair/BSC Sit to Stand: Mod assist, +2 physical assistance Bed to/from chair/wheelchair/BSC transfer type:: Step pivot Step pivot transfers: Min assist, +2 physical assistance General transfer comment: pt able to take good steps today with HHA+2 stand pivot to chair.  overall improved from yesterday but was hesitant to step with walker Ambulation/Gait General Gait Details: deferred   ADL: ADL Overall ADL's : Needs assistance/impaired Grooming: Sitting, Wash/dry face Grooming Details (indicate cue type and reason): Pt cued to wring out washcloth into kidney basin using BUE which he completed without dropping washcloth. When cued to wash his face he initiated with RUE requiring VC to utilize LUE which he was able to do but did not have enough strength/coordination in LUE to complete fully to help remove dead skin from his beard. CGA- MIN A for sitting balance from PT throughout. Lower Body Dressing: Moderate assistance, Sitting/lateral leans Lower Body Dressing Details (indicate cue type and reason): for socks, assistance required to maintain dynamic sitting balance   Cognition: Cognition Overall Cognitive Status: Within Functional Limits for  tasks assessed Orientation Level: Oriented X4 Cognition Arousal/Alertness: Awake/alert Behavior During Therapy: WFL for tasks assessed/performed, Flat affect Overall Cognitive Status: Within Functional Limits for tasks assessed General Comments: Pt oriented to self, Sunday. Stated current date is March 24th, 2024 then changed to May. Slow processing   Physical Exam: Blood pressure (!) 140/84, pulse 89, temperature 97.9 F (36.6 C), resp. rate 19, height 5\' 4"  (1.626 m), weight 64.4 kg, SpO2 92 %. Physical Exam Vitals and nursing note reviewed.  Constitutional:      General: He is not in acute distress.    Appearance: He is ill-appearing.     Comments: Pt appears frail;  sitting up in bed; appears older than stated age; awake, alert, appropriate, sitting up eating lunch, NAD  HENT:     Head: Normocephalic.     Comments: Abrasion on top of head with dried blood due to dog making him fall into doghouse No facial droop seen Tongue midline Severe eczema seen on face with associated redness and peeling- esp in butterfly distribution Cradle cap seen on head    Right Ear: External ear normal.     Left Ear: External ear normal.     Nose: Nose normal. No congestion.     Mouth/Throat:     Mouth: Mucous membranes are moist.     Pharynx: Oropharynx is clear. No oropharyngeal exudate.  Eyes:     General:        Right eye: No discharge.        Left eye: No discharge.     Extraocular Movements: Extraocular movements intact.  Cardiovascular:     Rate and Rhythm: Normal rate and regular rhythm.     Heart sounds: Normal heart sounds. No murmur heard.    No gallop.     Comments: Sternal noncardiac TTP- some associated bruising- no obvious abrasions Pulmonary:     Effort: Pulmonary effort is normal. No respiratory distress.     Breath sounds: Normal breath sounds. No wheezing or rales.  Abdominal:     General: Bowel sounds are normal. There is no distension.     Palpations: Abdomen is soft.      Tenderness: There is no abdominal tenderness.     Comments: normoactive  Musculoskeletal:     Cervical back: Neck supple. No tenderness.     Comments: RUE 5-/5 in biceps, triceps, WE, grip and  FA LUE- 4/5 in same muscles- FA 4-/5 RLE- 5-/5 in HF, KE, DF and PF LLE- HF 1 to 2-/5; DF 3-/5 and PF 3-/5- said too painful to do knee movement  Skin:    General: Skin is warm and dry.     Comments: Peeling on face and head with cradle cap/psoriasis? On head Balding IV L forearm looks OK R AC fossa bruise from prior IV/IV stick Bruise on sternum from fall  Neurological:     Mental Status: He is alert.     Comments: Decreased to light touch on L side LUE/LLE Slightly delayed responses Ox3 with cues  Psychiatric:     Comments: Flat, focused on food        Lab Results Last 48 Hours        Results for orders placed or performed during the hospital encounter of 02/16/23 (from the past 48 hour(s))  Glucose, capillary     Status: Abnormal    Collection Time: 02/18/23 11:34 AM  Result Value Ref Range    Glucose-Capillary 223 (H) 70 - 99 mg/dL      Comment: Glucose reference range applies only to samples taken after fasting for at least 8 hours.  Glucose, capillary     Status: Abnormal    Collection Time: 02/18/23  4:17 PM  Result Value Ref Range    Glucose-Capillary 236 (H) 70 - 99 mg/dL      Comment: Glucose reference range applies only to samples taken after fasting for at least 8 hours.  Glucose, capillary     Status: Abnormal    Collection Time: 02/18/23  9:16 PM  Result Value Ref Range    Glucose-Capillary 215 (H) 70 - 99 mg/dL      Comment: Glucose reference range applies only to samples taken after fasting for at least 8 hours.    Comment 1 Notify RN    Glucose, capillary     Status: Abnormal    Collection Time: 02/19/23  8:07 AM  Result Value Ref Range    Glucose-Capillary 68 (L) 70 - 99 mg/dL      Comment: Glucose reference range applies only to samples taken after fasting  for at least 8 hours.  Glucose, capillary     Status: Abnormal    Collection Time: 02/19/23 11:46 AM  Result Value Ref Range    Glucose-Capillary 108 (H) 70 - 99 mg/dL      Comment: Glucose reference range applies only to samples taken after fasting for at least 8 hours.  Glucose, capillary     Status: Abnormal    Collection Time: 02/19/23  5:09 PM  Result Value Ref Range    Glucose-Capillary 156 (H) 70 - 99 mg/dL      Comment: Glucose reference range applies only to samples taken after fasting for at least 8 hours.  Glucose, capillary     Status: Abnormal    Collection Time: 02/19/23  9:23 PM  Result Value Ref Range    Glucose-Capillary 165 (H) 70 - 99 mg/dL      Comment: Glucose reference range applies only to samples taken after fasting for at least 8 hours.    Comment 1 Notify RN    Glucose, capillary     Status: Abnormal    Collection Time: 02/20/23  7:55 AM  Result Value Ref Range    Glucose-Capillary 101 (H) 70 - 99 mg/dL      Comment: Glucose reference range applies only to samples taken after fasting for  at least 8 hours.       Imaging Results (Last 48 hours)  CT ANGIO HEAD NECK W WO CM   Result Date: 02/18/2023 CLINICAL DATA:  Acute infarct on recent MRI EXAM: CT ANGIOGRAPHY HEAD AND NECK WITH AND WITHOUT CONTRAST TECHNIQUE: Multidetector CT imaging of the head and neck was performed using the standard protocol during bolus administration of intravenous contrast. Multiplanar CT image reconstructions and MIPs were obtained to evaluate the vascular anatomy. Carotid stenosis measurements (when applicable) are obtained utilizing NASCET criteria, using the distal internal carotid diameter as the denominator. RADIATION DOSE REDUCTION: This exam was performed according to the departmental dose-optimization program which includes automated exposure control, adjustment of the mA and/or kV according to patient size and/or use of iterative reconstruction technique. CONTRAST:  75mL  OMNIPAQUE IOHEXOL 350 MG/ML SOLN COMPARISON:  No prior CTA available, correlation is made with CT head 02/16/2023 and MRI head 02/17/2023 FINDINGS: CT HEAD FINDINGS Brain: No evidence of acute infarct, hemorrhage, mass, mass effect, or midline shift. No hydrocephalus or extra-axial fluid collection. Periventricular white matter changes, likely the sequela of chronic small vessel ischemic disease. Remote lacunar infarcts in the right basal ganglia. No definite hypodensity is seen in the right cerebral peduncle to correlate with the acute infarct on the 02/17/2023 MRI. Vascular: No hyperdense vessel. Atherosclerotic calcifications in the intracranial carotid and vertebral arteries. Skull: Negative for fracture or focal lesion. Sinuses/Orbits: No acute finding. Other: The mastoid air cells are well aerated. CTA NECK FINDINGS Aortic arch: Standard branching. Imaged portion shows no evidence of aneurysm or dissection. No significant stenosis of the major arch vessel origins. Mild aortic atherosclerosis. Right carotid system: No evidence of dissection, occlusion, or hemodynamically significant stenosis (greater than 50%). Atherosclerotic disease at the bifurcation and in the proximal ICA is not hemodynamically significant. Left carotid system: No evidence of dissection, occlusion, or hemodynamically significant stenosis (greater than 50%). Atherosclerotic disease at the bifurcation and in the proximal ICA is not hemodynamically significant. Vertebral arteries: No evidence of dissection, occlusion, or hemodynamically significant stenosis (greater than 50%). Skeleton: No acute osseous abnormality. Degenerative changes in the cervical spine. Other neck: No acute finding. Upper chest: No focal pulmonary opacity or pleural effusion. Review of the MIP images confirms the above findings CTA HEAD FINDINGS Anterior circulation: Both internal carotid arteries are patent to the termini, with mild stenosis in the bilateral cavernous  and supraclinoid segments. A1 segments patent. Normal anterior communicating artery. Anterior cerebral arteries are patent to their distal aspects without significant stenosis. Mild stenosis in the left M1 (series 10, image 221). Moderate stenosis in the distal right M1 (series 10, images 222-223). MCA branches are irregular but perfused to their distal aspects with no more than mild focal stenosis on the right (for example, series 10, image 172, 187) Posterior circulation: Vertebral arteries patent to the vertebrobasilar junction with mild stenosis in the right V4 (series 10, image 271 and 304). Posterior inferior cerebellar arteries patent proximally. Basilar is irregular but patent to its distal aspect without significant stenosis. Superior cerebellar arteries patent proximally. Patent P1 segments. PCAs are irregular but patent through the P 2 segments without significant stenosis. Evaluation of the more distal PCAs is limited by venous contamination. The bilateral posterior communicating arteries are not visualized. Venous sinuses: As permitted by contrast timing, patent. Anatomic variants: None significant. Review of the MIP images confirms the above findings IMPRESSION: 1. No acute intracranial process. Remote lacunar infarcts in the right basal ganglia. 2. No intracranial large  vessel occlusion. Mild stenosis in the bilateral cavernous and supraclinoid ICAs. Moderate stenosis in the distal right M1 and mild stenosis in the left M1 and right V4. 3. No hemodynamically significant stenosis in the neck. 4. Aortic atherosclerosis. Aortic Atherosclerosis (ICD10-I70.0). Electronically Signed   By: Wiliam Ke M.D.   On: 02/18/2023 15:24           Blood pressure (!) 140/84, pulse 89, temperature 97.9 F (36.6 C), resp. rate 19, height 5\' 4"  (1.626 m), weight 64.4 kg, SpO2 92 %.   Medical Problem List and Plan: 1. 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  Antithrombotics: -DVT/anticoagulation:  Pharmaceutical: Lovenox             -antiplatelet therapy: Aspirin and Plavix for three weeks followed by Plavix alone   3. Pain Management: Tylenol, oxycodone as needed- pt doesn't want "pain meds' but willing to try Tylenol- pain 8-9/10- but explained cannot do BC powders   4. Mood/Behavior/Sleep: LCSW to evaluate and provide emotional support             -antipsychotic agents: n/a   5. Neuropsych/cognition: This patient is capable of making decisions on his own behalf.   6. Skin/Wound Care: Routine skin care checks   7. Fluids/Electrolytes/Nutrition: Routine Is and Os and follow-up chemistries   8: Hypertension: monitor TID and prn (home: Hyzaar 100/25 daily, verapamil 240 mg daily)             -controlled currently on no meds   9: Hyperlipidemia: continue statin   10: Peripheral neuropathy: continue gabapentin 600 mg q HS   11:Left greater trochanter fracture: non-operative             -WBAT with a walker             -follow-up with Dr. Joice Lofts   12: DM-2: CBGs QID, carb modified diet; home: (Levemir 50 units BID; Victoza 1.8 mg daily, metformin 1000 mg BID) A1c 13.1%             -continue SSI             -continue Novolog 5 units with meals             -continue Levemir 30 units in AM, 20 units at bedtime             -suggest bringing Victoza in from home.  13: Hyponatremia: follow-up BMP   14: Elevated bilirubin: follow-up CMP   15: Vitamin D deficiency: weekly supplementation for 8 weeks         Milinda Antis, PA-C 02/20/2023     I have personally performed a face to face diagnostic evaluation of this patient and formulated the key components of the plan.  Additionally, I have personally reviewed laboratory data, imaging studies, as well as relevant notes and concur with the physician assistant's documentation above.   The patient's status  has not changed from the original H&P.  Any changes in documentation from the acute care chart have been noted above.

## 2023-02-20 NOTE — Progress Notes (Signed)
Inpatient Rehabilitation Admission Medication Review by a Pharmacist  A complete drug regimen review was completed for this patient to identify any potential clinically significant medication issues.  High Risk Drug Classes Is patient taking? Indication by Medication  Antipsychotic No   Anticoagulant Yes Lovenox - VTE ppx  Antibiotic No   Opioid Yes Oxycodone prn pain  Antiplatelet Yes Aspirin, clopidogrel - CVA hx  Hypoglycemics/insulin Yes Insulin - DM   Vasoactive Medication No   Chemotherapy No   Other Yes Atorvastatin - HLD Gabapentin - Pain Vitamin D - supplement Methocarbamol - prn spasms Ondansetron - prn N/V Trazodone - prn sleep     Type of Medication Issue Identified Description of Issue Recommendation(s)  Drug Interaction(s) (clinically significant)     Duplicate Therapy     Allergy     No Medication Administration End Date     Incorrect Dose     Additional Drug Therapy Needed     Significant med changes from prior encounter (inform family/care partners about these prior to discharge).    Other       Clinically significant medication issues were identified that warrant physician communication and completion of prescribed/recommended actions by midnight of the next day:  No  Name of provider notified for urgent issues identified:   Provider Method of Notification:     Pharmacist comments: None  Time spent performing this drug regimen review (minutes):  20 minutes   Thank you Okey Regal, PharmD

## 2023-02-20 NOTE — Inpatient Diabetes Management (Signed)
Inpatient Diabetes Program Recommendations  AACE/ADA: New Consensus Statement on Inpatient Glycemic Control Target Ranges:  Prepandial:   less than 140 mg/dL      Peak postprandial:   less than 180 mg/dL (1-2 hours)      Critically ill patients:  140 - 180 mg/dL    Latest Reference Range & Units 02/20/23 07:55 02/20/23 12:12  Glucose-Capillary 70 - 99 mg/dL 161 (H)  Novolog 5 units  145 (H)    Latest Reference Range & Units 02/19/23 08:07 02/19/23 11:46 02/19/23 17:09 02/19/23 21:23  Glucose-Capillary 70 - 99 mg/dL 68 (L)    Levemir 30 units  108 (H) 156 (H)  Novolog 2 units  165 (H)    Levemir 20 units daily    Latest Reference Range & Units 02/17/23 04:03  Hemoglobin A1C 4.8 - 5.6 % 13.1 (H)   Review of Glycemic Control  Diabetes history: DM2 Outpatient Diabetes medications: Levemir 50 units BID, Victoza 1.8 mg daily, Metformin 1000 mg BID Current orders for Inpatient glycemic control: Levemir 30 units daily, Levemir 20 units daily at HS, Novolog 0-9 units TID with meals, Novolog 0-5 units QHS, Novolog 5 units TID with meals  Inpatient Diabetes Program Recommendations:    Insulin: Patient received a total of 50 units of Leveimr on 02/19/23. Fasting CBG 101 today and noon 145 mg/dl and no Levemir given this morning.  Please consider discontinuing Levemir 30 units daily and change bedtime Levemir to 25 units QHS. Also recommend to discontinue Novolog 5 units TID with meals for now and can add back if post prandial glucose is elevated.   NOTE: Patient admitted 02/17/23 with left greater trochanteric fracture after fall and was discharged from the [facility] hospital today and admitted to CIR. Per chart review, patient last seen Endocdrinology Donata Clay, [place] with [facility]) on 07/24/22 and per note patient was prescribed Levemir 50 units BID, Metformin 1000 mg BID and asked to start Amaryl 4 mg daily. No follow up visits noted in chart since then and last office visit note with  Unknown Foley, PA (PCP) in the chart was 08/28/20. Inpatient diabetes coordinator attempted to speak with patient on 02/18/33 but patient reported he was taking taking any DM medications and he did not remember what he had been on. Would recommend to decrease insulin regimen especially since patient will likely be more active in CIR. Will follow along.  Thanks, Orlando Penner, RN, MSN, CDCES Diabetes Coordinator Inpatient Diabetes Program [phone] (Team Pager from 8am to 5pm)

## 2023-02-20 NOTE — Discharge Summary (Signed)
[name] GEX:[id number] DOB: [birth year] DOA: 02/16/2023  PCP: Gildardo Pounds, PA  Admit date: 02/16/2023 Discharge date: 02/20/2023  Time spent: 35 minutes  Recommendations for Outpatient Follow-up:  Outpatient pcp f/u to address uncontrolled DM  Ortho f/u 5-6 weeks (Dr. Joice Lofts)    Discharge Diagnoses:  Principal Problem:   Fracture of hip, closed, left, initial encounter [facility]) Active Problems:   Essential hypertension   History of CVA (cerebrovascular accident)   T2DM (type 2 diabetes mellitus) (HCC)   Diabetic peripheral neuropathy associated with type 2 diabetes mellitus (HCC)   Acute CVA (cerebrovascular accident) [facility])   Discharge Condition: stable  Diet recommendation: heart healthy  Filed Weights   02/16/23 2203  Weight: 64.4 kg    History of present illness:  From admission h and p Leelan Rajewski Esmond Hinch is a 72 y.o. male with medical history significant of  DMII , diabetic neuropathy, hx of CVA,hypertension,CAD s/p remote MI, Right Carotida stenosis Who presents with multiple falls the last resulting in left hip pain and inability to stand. Patient states he was walking home from the store as he usually does each morning and note that his legs gave way multiple time while walking home. Patient states he completed this walk the day before with out any issues.. He states that it feels as if his  left leg is going to fast for his right. He notes when he fell his also his is chest. He note no following sob, but noted pain in breathing. He states he believes he felt due to severe neuropathy from his diabetes as he had fallen in the past and has difficulty feeling the ground beneath him. Currently s/p medication in ED his pain is well controlled .   Hospital Course:   # Left greater trochanter fracture After trip and fall at home. Ortho following, this is non-operative - wbat with walker - PT advising CIR, discharging there - ortho has signed off, will need f/u with them  in 5-6 wks (Dr. Joice Lofts)   # Acute CVA # Hx CVA To cerebellar peduncle. CTA that needs intervention. Favored to be small vessel.  - neuro followed, has signed off. - CIR as above - dapt for 21 days (through 5/19) followed by plavix monotherapy - statin changed to atorva  - neuro f/u, referral made   # Vomiting 4/27 AM. Possibly 2/2 pain meds. Does have hx sbo but KUB negative. Nausea/vomiting resolved - monitor  # Depression Daughter states patient has made statements about wanting to hurt himself. Patient denies this and denies thoughts of hurting himself, plan to do so, etc. - monitor   # T2DM Uncontrolled. Brittle.  - continue levemir 30 in the morning, evening to 20 - SSI sensitive - dm educator consult pending   # Peripheral neuropathy Severe, has lost most sensation in feet. No weakness, is symmetric. - home gabapentin - obviously better glucose control is important   # HTN - can resume home losartan and hctz. Consider adding back verapamil as needed  Procedures: none   Consultations: Neurology, orthopedics  Discharge Exam: Vitals:   02/19/23 2253 02/20/23 0829  BP: 116/69 (!) 140/84  Pulse: 86 89  Resp: 16 19  Temp: 98.2 F (36.8 C) 97.9 F (36.6 C)  SpO2: 92% 92%    General exam: dissheveled Respiratory system: Clear to auscultation. Respiratory effort normal. Cardiovascular system: S1 & S2 heard, RRR. No JVD, murmurs, rubs, gallops or clicks. No pedal edema. Gastrointestinal system: Abdomen is  nondistended, soft and nontender. No organomegaly or masses felt. Normal bowel sounds heard. Central nervous system: Alert and oriented. Sensation in feet is reduced. Decreased strength LEs L>R Extremities: warm, no edema Skin: No rashes, lesions or ulcers Psychiatry: Judgement and insight appear normal. Mood & affect appropriate.   Discharge Instructions   Discharge Instructions     Ambulatory referral to Neurology   Complete by: As directed    Diet -  low sodium heart healthy   Complete by: As directed    Increase activity slowly   Complete by: As directed       Allergies as of 02/20/2023       Reactions   Bee Venom Anaphylaxis   Penicillins Anaphylaxis        Medication List     STOP taking these medications    meloxicam 15 MG tablet Commonly known as: MOBIC   metFORMIN 1000 MG tablet Commonly known as: GLUCOPHAGE   rosuvastatin 10 MG tablet Commonly known as: CRESTOR   sildenafil 20 MG tablet Commonly known as: REVATIO   verapamil 240 MG 24 hr capsule Commonly known as: VERELAN   Victoza 18 MG/3ML Sopn Generic drug: liraglutide       TAKE these medications    aspirin EC 81 MG tablet Take 81 mg by mouth daily. For pain   atorvastatin 40 MG tablet Commonly known as: LIPITOR Take 1 tablet (40 mg total) by mouth daily. Start taking on: Feb 21, 2023   BD Pen Needle Micro U/F 32G X 6 MM Misc Generic drug: Insulin Pen Needle Use pen needles for insulin injection each evening   clopidogrel 75 MG tablet Commonly known as: PLAVIX Take 1 tablet (75 mg total) by mouth daily. Start taking on: Feb 21, 2023   gabapentin 600 MG tablet Commonly known as: NEURONTIN Take 600 mg by mouth at bedtime.   Levemir FlexPen 100 UNIT/ML FlexPen Generic drug: insulin detemir 30 in the morning and 20 at night What changed:  how much to take how to take this when to take this additional instructions   losartan-hydrochlorothiazide 100-25 MG tablet Commonly known as: HYZAAR Take 1 tablet by mouth daily.   ondansetron 4 MG disintegrating tablet Commonly known as: ZOFRAN-ODT Take 1 tablet (4 mg total) by mouth every 8 (eight) hours as needed for nausea or vomiting.   oxyCODONE 5 MG immediate release tablet Commonly known as: Oxy IR/ROXICODONE Take 1 tablet (5 mg total) by mouth every 6 (six) hours as needed for moderate pain.       Allergies  Allergen Reactions   Bee Venom Anaphylaxis   Penicillins  Anaphylaxis    Follow-up Information     Unknown Foley A, PA Follow up.   Specialty: Physician Assistant Contact information: [address] [place] [address] [phone]         Poggi, Excell Seltzer, MD Follow up.   Specialty: Orthopedic Surgery Why: in 5-6 weeks Contact information: [address] [facility] [place] [address] [phone]                  The results of significant diagnostics from this hospitalization (including imaging, microbiology, ancillary and laboratory) are listed below for reference.    Significant Diagnostic Studies: CT ANGIO HEAD NECK W WO CM  Result Date: 02/18/2023 CLINICAL DATA:  Acute infarct on recent MRI EXAM: CT ANGIOGRAPHY HEAD AND NECK WITH AND WITHOUT CONTRAST TECHNIQUE: Multidetector CT imaging of the head and neck was performed using  the standard protocol during bolus administration of intravenous contrast. Multiplanar CT image reconstructions and MIPs were obtained to evaluate the vascular anatomy. Carotid stenosis measurements (when applicable) are obtained utilizing NASCET criteria, using the distal internal carotid diameter as the denominator. RADIATION DOSE REDUCTION: This exam was performed according to the departmental dose-optimization program which includes automated exposure control, adjustment of the mA and/or kV according to patient size and/or use of iterative reconstruction technique. CONTRAST:  75mL OMNIPAQUE IOHEXOL 350 MG/ML SOLN COMPARISON:  No prior CTA available, correlation is made with CT head 02/16/2023 and MRI head 02/17/2023 FINDINGS: CT HEAD FINDINGS Brain: No evidence of acute infarct, hemorrhage, mass, mass effect, or midline shift. No hydrocephalus or extra-axial fluid collection. Periventricular white matter changes, likely the sequela of chronic small vessel ischemic disease. Remote lacunar infarcts in the right basal ganglia. No definite hypodensity is seen in the right cerebral peduncle  to correlate with the acute infarct on the 02/17/2023 MRI. Vascular: No hyperdense vessel. Atherosclerotic calcifications in the intracranial carotid and vertebral arteries. Skull: Negative for fracture or focal lesion. Sinuses/Orbits: No acute finding. Other: The mastoid air cells are well aerated. CTA NECK FINDINGS Aortic arch: Standard branching. Imaged portion shows no evidence of aneurysm or dissection. No significant stenosis of the major arch vessel origins. Mild aortic atherosclerosis. Right carotid system: No evidence of dissection, occlusion, or hemodynamically significant stenosis (greater than 50%). Atherosclerotic disease at the bifurcation and in the proximal ICA is not hemodynamically significant. Left carotid system: No evidence of dissection, occlusion, or hemodynamically significant stenosis (greater than 50%). Atherosclerotic disease at the bifurcation and in the proximal ICA is not hemodynamically significant. Vertebral arteries: No evidence of dissection, occlusion, or hemodynamically significant stenosis (greater than 50%). Skeleton: No acute osseous abnormality. Degenerative changes in the cervical spine. Other neck: No acute finding. Upper chest: No focal pulmonary opacity or pleural effusion. Review of the MIP images confirms the above findings CTA HEAD FINDINGS Anterior circulation: Both internal carotid arteries are patent to the termini, with mild stenosis in the bilateral cavernous and supraclinoid segments. A1 segments patent. Normal anterior communicating artery. Anterior cerebral arteries are patent to their distal aspects without significant stenosis. Mild stenosis in the left M1 (series 10, image 221). Moderate stenosis in the distal right M1 (series 10, images 222-223). MCA branches are irregular but perfused to their distal aspects with no more than mild focal stenosis on the right (for example, series 10, image 172, 187) Posterior circulation: Vertebral arteries patent to the  vertebrobasilar junction with mild stenosis in the right V4 (series 10, image 271 and 304). Posterior inferior cerebellar arteries patent proximally. Basilar is irregular but patent to its distal aspect without significant stenosis. Superior cerebellar arteries patent proximally. Patent P1 segments. PCAs are irregular but patent through the P 2 segments without significant stenosis. Evaluation of the more distal PCAs is limited by venous contamination. The bilateral posterior communicating arteries are not visualized. Venous sinuses: As permitted by contrast timing, patent. Anatomic variants: None significant. Review of the MIP images confirms the above findings IMPRESSION: 1. No acute intracranial process. Remote lacunar infarcts in the right basal ganglia. 2. No intracranial large vessel occlusion. Mild stenosis in the bilateral cavernous and supraclinoid ICAs. Moderate stenosis in the distal right M1 and mild stenosis in the left M1 and right V4. 3. No hemodynamically significant stenosis in the neck. 4. Aortic atherosclerosis. Aortic Atherosclerosis (ICD10-I70.0). Electronically Signed   By: Wiliam Ke M.D.   On: 02/18/2023 15:24  ECHOCARDIOGRAM COMPLETE  Result Date: 02/17/2023    ECHOCARDIOGRAM REPORT   Patient Name:   SYLIS KETCHUM Date of Exam: 02/17/2023 Medical Rec #:  [id number]       Height:       64.0 in Accession #:    [id number]      Weight:       142.0 lb Date of Birth:  1951/05/05       BSA:          1.691 m Patient Age:    71 years        BP:           107/62 mmHg Patient Gender: M               HR:           69 bpm. Exam Location:  [facility] Procedure: 2D Echo, Cardiac Doppler and Color Doppler Indications:    Acute ischemic heart disease, unspecified I24.9  History:        Patient has no prior history of Echocardiogram examinations.  Sonographer:    Louie Boston RDCS Referring Phys: [id number] SARA-MAIZ A THOMAS IMPRESSIONS  1. Left ventricular ejection fraction, by estimation, is 60 to 65%. The  left ventricle has normal function. The left ventricle has no regional wall motion abnormalities. There is mild left ventricular hypertrophy. Left ventricular diastolic parameters were normal.  2. Right ventricular systolic function is normal. The right ventricular size is normal. There is normal pulmonary artery systolic pressure. The estimated right ventricular systolic pressure is 27.0 mmHg.  3. The mitral valve is normal in structure. Mild mitral valve regurgitation. No evidence of mitral stenosis.  4. The aortic valve is tricuspid. Aortic valve regurgitation is not visualized. Aortic valve sclerosis is present, with no evidence of aortic valve stenosis.  5. The inferior vena cava is normal in size with greater than 50% respiratory variability, suggesting right atrial pressure of 3 mmHg. FINDINGS  Left Ventricle: Left ventricular ejection fraction, by estimation, is 60 to 65%. The left ventricle has normal function. The left ventricle has no regional wall motion abnormalities. The left ventricular internal cavity size was normal in size. There is  mild left ventricular hypertrophy. Left ventricular diastolic parameters were normal. Right Ventricle: The right ventricular size is normal. No increase in right ventricular wall thickness. Right ventricular systolic function is normal. There is normal pulmonary artery systolic pressure. The tricuspid regurgitant velocity is 2.45 m/s, and  with an assumed right atrial pressure of 3 mmHg, the estimated right ventricular systolic pressure is 27.0 mmHg. Left Atrium: Left atrial size was normal in size. Right Atrium: Right atrial size was normal in size. Pericardium: There is no evidence of pericardial effusion. Mitral Valve: The mitral valve is normal in structure. Mild mitral valve regurgitation. No evidence of mitral valve stenosis. Tricuspid Valve: The tricuspid valve is normal in structure. Tricuspid valve regurgitation is not demonstrated. No evidence of tricuspid  stenosis. Aortic Valve: The aortic valve is tricuspid. Aortic valve regurgitation is not visualized. Aortic valve sclerosis is present, with no evidence of aortic valve stenosis. Pulmonic Valve: The pulmonic valve was normal in structure. Pulmonic valve regurgitation is not visualized. No evidence of pulmonic stenosis. Aorta: The aortic root is normal in size and structure. Venous: The inferior vena cava is normal in size with greater than 50% respiratory variability, suggesting right atrial pressure of 3 mmHg. IAS/Shunts: No atrial level shunt detected by color flow Doppler.  LEFT VENTRICLE PLAX 2D  LVIDd:         3.80 cm   Diastology LVIDs:         2.80 cm   LV e' medial:    7.29 cm/s LV PW:         1.35 cm   LV E/e' medial:  12.7 LV IVS:        1.30 cm   LV e' lateral:   9.14 cm/s LVOT diam:     2.00 cm   LV E/e' lateral: 10.2 LV SV:         55 LV SV Index:   33 LVOT Area:     3.14 cm  RIGHT VENTRICLE             IVC RV Basal diam:  3.60 cm     IVC diam: 1.69 cm RV S prime:     11.70 cm/s TAPSE (M-mode): 2.0 cm LEFT ATRIUM             Index        RIGHT ATRIUM           Index LA diam:        3.10 cm 1.83 cm/m   RA Area:     17.10 cm LA Vol (A2C):   65.9 ml 38.96 ml/m  RA Volume:   46.90 ml  27.73 ml/m LA Vol (A4C):   34.2 ml 20.22 ml/m LA Biplane Vol: 47.3 ml 27.97 ml/m  AORTIC VALVE LVOT Vmax:   85.77 cm/s LVOT Vmean:  56.233 cm/s LVOT VTI:    0.176 m  AORTA Ao Root diam: 3.60 cm Ao Asc diam:  3.40 cm Ao Desc diam: 2.40 cm MITRAL VALVE               TRICUSPID VALVE MV Area (PHT): 3.27 cm    TR Peak grad:   24.0 mmHg MV Decel Time: 232 msec    TR Vmax:        245.00 cm/s MV E velocity: 92.80 cm/s MV A velocity: 85.90 cm/s  SHUNTS MV E/A ratio:  1.08        Systemic VTI:  0.18 m                            Systemic Diam: 2.00 cm Julien Nordmann MD Electronically signed by Julien Nordmann MD Signature Date/Time: 02/17/2023/1:06:31 PM    Final    DG Abd Portable 1V  Result Date: 02/17/2023 CLINICAL DATA:  New  onset vomiting.  Last bowel movement 2 days ago. EXAM: PORTABLE ABDOMEN - 1 VIEW COMPARISON:  Abdominopelvic CT 12/20/2019. Abdominal radiographs 12/20/2019. FINDINGS: Two supine views of the abdomen are submitted. There is a normal nonobstructive bowel gas pattern. No supine evidence of pneumoperitoneum or bowel wall thickening. Cholecystectomy clips and scattered vascular calcifications are noted. There are mild degenerative changes in the spine without acute osseous abnormality. IMPRESSION: No evidence of bowel obstruction or other acute process. Electronically Signed   By: Carey Bullocks M.D.   On: 02/17/2023 09:13   MR BRAIN WO CONTRAST  Result Date: 02/17/2023 CLINICAL DATA:  Motor neuron disease suspected. Headache and weakness. Fall 1 day ago. EXAM: MRI HEAD WITHOUT CONTRAST TECHNIQUE: Multiplanar, multiecho pulse sequences of the brain and surrounding structures were obtained without intravenous contrast. COMPARISON:  CT head without contrast 02/16/2023. MR head without contrast 01/07/2012 FINDINGS: Brain: The diffusion-weighted images suggest an acute nonhemorrhagic punctate infarct in the anteromedial aspect of the  right cerebral peduncle. No T2 or FLAIR signal abnormality is associated with this area. This is an area of known artifact on diffusion-weighted images. No other acute infarct present. Moderate generalized atrophy and white matter disease is present. A remote lacunar infarct is noted in the white matter adjacent to the frontal horn of the left lateral ventricle, stable since 2013. Remote lacunar infarcts are also present in the right basal ganglia. No acute hemorrhage or mass lesion is present. The ventricles are proportionate to the degree of atrophy. No significant extraaxial fluid collection is present. The brainstem and cerebellum are otherwise within normal limits. Vascular: Flow is present in the major intracranial arteries. Skull and upper cervical spine: The craniocervical junction  is normal. Upper cervical spine is within normal limits. Marrow signal is unremarkable. Sinuses/Orbits: The paranasal sinuses and mastoid air cells are clear. The globes and orbits are within normal limits. IMPRESSION: 1. Possible acute nonhemorrhagic punctate infarct in the anteromedial aspect of the right cerebral peduncle. No T2 or FLAIR signal abnormality is associated. This may be artifact. 2. No other acute infarct. 3. Moderate generalized atrophy and white matter disease likely reflects the sequela of chronic microvascular ischemia. 4. Remote lacunar infarcts of the white matter adjacent to the frontal horn of the left lateral ventricle and right basal ganglia. Electronically Signed   By: Marin Roberts M.D.   On: 02/17/2023 06:56   CT PELVIS WO CONTRAST  Result Date: 02/16/2023 CLINICAL DATA:  Recent fall with left hip pain and unremarkable x-rays, initial encounter EXAM: CT PELVIS WITHOUT CONTRAST TECHNIQUE: Multidetector CT imaging of the pelvis was performed following the standard protocol without intravenous contrast. RADIATION DOSE REDUCTION: This exam was performed according to the departmental dose-optimization program which includes automated exposure control, adjustment of the mA and/or kV according to patient size and/or use of iterative reconstruction technique. COMPARISON:  Plain film from earlier in the same day. FINDINGS: Urinary Tract:  Bladder is well distended. Bowel:  Visualized bowel shows no obstructive changes. Vascular/Lymphatic: Vascular calcifications are seen without aneurysmal dilatation. Reproductive:  Prostate is within normal limits. Other:  No free pelvic fluid is seen. Musculoskeletal: Bony structures show a an undisplaced left greater trochanter fracture without completion into the intratrochanteric region. Femoral neck appears intact. Associated soft tissue swelling is noted. No other bony abnormality is seen. IMPRESSION: Left greater trochanter fracture without  significant displacement. No intratrochanteric or femoral neck involvement is noted. Electronically Signed   By: Alcide Clever M.D.   On: 02/16/2023 19:56   CT CHEST WO CONTRAST  Result Date: 02/16/2023 CLINICAL DATA:  Chest trauma, fall.  Cardiac injury suspected. EXAM: CT CHEST WITHOUT CONTRAST TECHNIQUE: Multidetector CT imaging of the chest was performed following the standard protocol without IV contrast. RADIATION DOSE REDUCTION: This exam was performed according to the departmental dose-optimization program which includes automated exposure control, adjustment of the mA and/or kV according to patient size and/or use of iterative reconstruction technique. COMPARISON:  None Available. FINDINGS: Cardiovascular: The heart is mildly enlarged. Prominent coronary artery atherosclerotic calcifications. Main pulmonary trunk is dilated measuring up to 3.7 cm concerning for pulmonary arterial hypertension. Atherosclerotic calcification of the aortic arch, aorta is however normal in caliber. Mediastinum/Nodes: No enlarged mediastinal or axillary lymph nodes. Thyroid gland, trachea, and esophagus demonstrate no significant findings. Lungs/Pleura: Mild bibasilar fibrotic changes with interlobular septal thickening. No evidence of pneumonia or pulmonary edema. No pleural effusion or pneumothorax. Upper Abdomen: No acute abnormality. Musculoskeletal: No fracture is seen. IMPRESSION: 1. No  acute abnormality of the chest. 2. Mild bibasilar fibrotic changes with interlobular septal thickening. No evidence of pneumonia or pulmonary edema. 3. Mild cardiomegaly. 4. Prominent coronary artery atherosclerotic calcifications. 5. Main pulmonary trunk is dilated measuring up to 3.7 cm concerning for pulmonary arterial hypertension. 6. Aortic atherosclerosis. Electronically Signed   By: Larose Hires D.O.   On: 02/16/2023 17:21   CT HEAD WO CONTRAST ( )  Result Date: 02/16/2023 CLINICAL DATA:  Head trauma.  Moderate-to-severe.  EXAM: CT HEAD WITHOUT CONTRAST TECHNIQUE: Contiguous axial images were obtained from the base of the skull through the vertex without intravenous contrast. RADIATION DOSE REDUCTION: This exam was performed according to the departmental dose-optimization program which includes automated exposure control, adjustment of the mA and/or kV according to patient size and/or use of iterative reconstruction technique. COMPARISON:  CT head dated February 04, 2023 FINDINGS: Brain: No evidence of acute infarction, hemorrhage, hydrocephalus, extra-axial collection or mass lesion/mass effect. Cerebral atrophy and presumed chronic microvascular ischemic changes of the white matter, unchanged. Vascular: No hyperdense vessel or unexpected calcification. Skull: Normal. Negative for fracture or focal lesion. Sinuses/Orbits: No acute finding. Other: None. IMPRESSION: 1. No acute intracranial abnormality. 2. Cerebral atrophy and presumed chronic microvascular ischemic changes of the white matter, unchanged. Electronically Signed   By: Larose Hires D.O.   On: 02/16/2023 17:14   DG Chest 2 View  Result Date: 02/16/2023 CLINICAL DATA:  Pain, fell in yard and crawled into house, midsternal pain, LEFT hip pain EXAM: CHEST - 2 VIEW COMPARISON:  02/04/2023 FINDINGS: Normal heart size, mediastinal contours, and pulmonary vascularity. Chronic accentuation of bibasilar markings stable. No acute infiltrate, pleural effusion, or pneumothorax. Diffuse osseous demineralization. No definite fractures identified. IMPRESSION: Chronic accentuation of bibasilar markings. No acute abnormalities. Electronically Signed   By: Ulyses Southward M.D.   On: 02/16/2023 16:08   DG Hip Unilat With Pelvis 2-3 Views Left  Result Date: 02/16/2023 CLINICAL DATA:  Trauma, fall EXAM: DG HIP (WITH OR WITHOUT PELVIS) 2-3V LEFT COMPARISON:  None Available. FINDINGS: No displaced fracture or dislocation is seen. Joint spaces in both hips appear symmetrical. There is possible  disc space narrowing at the L5-S1 level in lumbar spine. IMPRESSION: No fracture or dislocation is seen. Electronically Signed   By: Ernie Avena M.D.   On: 02/16/2023 14:26   CT HEAD WO CONTRAST ( )  Result Date: 02/04/2023 CLINICAL DATA:  Vertigo. EXAM: CT HEAD WITHOUT CONTRAST TECHNIQUE: Contiguous axial images were obtained from the base of the skull through the vertex without intravenous contrast. RADIATION DOSE REDUCTION: This exam was performed according to the departmental dose-optimization program which includes automated exposure control, adjustment of the mA and/or kV according to patient size and/or use of iterative reconstruction technique. COMPARISON:  CT head dated December 20, 2019. FINDINGS: Brain: No evidence of acute infarction, hemorrhage, hydrocephalus, extra-axial collection or mass lesion/mass effect. Atrophy and chronic microvascular ischemic changes. Vascular: Calcified atherosclerosis at the skull base. No hyperdense vessel. Skull: Normal. Negative for fracture or focal lesion. Sinuses/Orbits: No acute finding. Other: None. IMPRESSION: 1. No acute intracranial abnormality. Electronically Signed   By: Obie Dredge M.D.   On: 02/04/2023 18:34   DG Chest 2 View  Result Date: 02/04/2023 CLINICAL DATA:  Dizziness. EXAM: CHEST - 2 VIEW COMPARISON:  Chest x-ray dated June 28, 2020. FINDINGS: The heart size and mediastinal contours are within normal limits. Normal pulmonary vascularity. Unchanged mild biapical pleural-parenchymal scarring. No focal consolidation, pleural effusion, or pneumothorax. No acute osseous abnormality.  IMPRESSION: No active cardiopulmonary disease. Electronically Signed   By: Obie Dredge M.D.   On: 02/04/2023 18:31    Microbiology: No results found for this or any previous visit (from the past 240 hour(s)).   Labs: Basic Metabolic Panel: Recent Labs  Lab 02/16/23 1709 02/17/23 0403  NA 132* 132*  K 4.2 4.4  CL 95* 96*  CO2 29 31   GLUCOSE 348* 417*  BUN 19 21  CREATININE 0.80 0.86  CALCIUM 9.2 8.4*   Liver Function Tests: Recent Labs  Lab 02/16/23 1709 02/17/23 0403  AST 28 49*  ALT 29 42  ALKPHOS 110 110  BILITOT 1.0 1.8*  PROT 8.3* 7.3  ALBUMIN 4.0 3.4*   Recent Labs  Lab 02/17/23 0403  LIPASE 23   No results for input(s): "AMMONIA" in the last 168 hours. CBC: Recent Labs  Lab 02/16/23 1709 02/17/23 0403  WBC 14.5* 9.5  NEUTROABS 11.0*  --   HGB 15.5 14.5  HCT 43.9 42.0  MCV 86.4 89.6  PLT 303 262   Cardiac Enzymes: No results for input(s): "CKTOTAL", "CKMB", "CKMBINDEX", "TROPONINI" in the last 168 hours. BNP: BNP (last 3 results) No results for input(s): "BNP" in the last 8760 hours.  ProBNP (last 3 results) No results for input(s): "PROBNP" in the last 8760 hours.  CBG: Recent Labs  Lab 02/19/23 0807 02/19/23 1146 02/19/23 1709 02/19/23 2123 02/20/23 0755  GLUCAP 68* 108* 156* 165* 101*       Signed:  Silvano Bilis MD.  [facility] 02/20/2023, 11:13 AM

## 2023-02-20 NOTE — Progress Notes (Addendum)
Patient ID: [name], male   DOB: 27-Sep-1951, 72 y.o.   MRN: [id number] Met with the patient to review current situation, rehab process, team conference and plan of care. Discussed CVA with fall; fractured hip (no surgery/WBAT) and weakness. Soreness in mid sternal area hit during the fall.Reviewed secondary risks including DM (A1C 13.1) on insulin PTA; daughter can administer, HTN, HLD, CAD with previous CVA/MI. Reviewed medications including DAPT x 3 weeks then plavix solo per MD. Reviewed dietary modifications including increased protein and calcium/Vitamin D with CMM diet.  Continue to follow along to address educational needs to facilitate preparation for discharge home with daughter. Pamelia Hoit

## 2023-02-21 ENCOUNTER — Encounter (HOSPITAL_COMMUNITY): Payer: Self-pay | Admitting: Physical Medicine and Rehabilitation

## 2023-02-21 ENCOUNTER — Other Ambulatory Visit: Payer: Self-pay

## 2023-02-21 LAB — CBC WITH DIFFERENTIAL/PLATELET
Abs Immature Granulocytes: 0.05 10*3/uL (ref 0.00–0.07)
Basophils Absolute: 0 10*3/uL (ref 0.0–0.1)
Basophils Relative: 1 %
Eosinophils Absolute: 0.3 10*3/uL (ref 0.0–0.5)
Eosinophils Relative: 4 %
HCT: 41.2 % (ref 39.0–52.0)
Hemoglobin: 14.1 g/dL (ref 13.0–17.0)
Immature Granulocytes: 1 %
Lymphocytes Relative: 23 %
Lymphs Abs: 1.8 10*3/uL (ref 0.7–4.0)
MCH: 30.8 pg (ref 26.0–34.0)
MCHC: 34.2 g/dL (ref 30.0–36.0)
MCV: 90 fL (ref 80.0–100.0)
Monocytes Absolute: 1.1 10*3/uL — ABNORMAL HIGH (ref 0.1–1.0)
Monocytes Relative: 14 %
Neutro Abs: 4.5 10*3/uL (ref 1.7–7.7)
Neutrophils Relative %: 57 %
Platelets: 309 10*3/uL (ref 150–400)
RBC: 4.58 MIL/uL (ref 4.22–5.81)
RDW: 12.2 % (ref 11.5–15.5)
WBC: 7.8 10*3/uL (ref 4.0–10.5)
nRBC: 0 % (ref 0.0–0.2)

## 2023-02-21 LAB — COMPREHENSIVE METABOLIC PANEL
ALT: 37 U/L (ref 0–44)
AST: 24 U/L (ref 15–41)
Albumin: 2.9 g/dL — ABNORMAL LOW (ref 3.5–5.0)
Alkaline Phosphatase: 120 U/L (ref 38–126)
Anion gap: 8 (ref 5–15)
BUN: 21 mg/dL (ref 8–23)
CO2: 28 mmol/L (ref 22–32)
Calcium: 8.6 mg/dL — ABNORMAL LOW (ref 8.9–10.3)
Chloride: 93 mmol/L — ABNORMAL LOW (ref 98–111)
Creatinine, Ser: 0.86 mg/dL (ref 0.61–1.24)
GFR, Estimated: >60 mL/min (ref >60)
Glucose, Bld: 219 mg/dL — ABNORMAL HIGH (ref 70–99)
Potassium: 4.2 mmol/L (ref 3.5–5.1)
Sodium: 129 mmol/L — ABNORMAL LOW (ref 135–145)
Total Bilirubin: 1.1 mg/dL (ref 0.3–1.2)
Total Protein: 7.2 g/dL (ref 6.5–8.1)

## 2023-02-21 LAB — VITAMIN D 25 HYDROXY (VIT D DEFICIENCY, FRACTURES): Vit D, 25-Hydroxy: 20.45 ng/mL — ABNORMAL LOW (ref 30–100)

## 2023-02-21 LAB — GLUCOSE, CAPILLARY
Glucose-Capillary: 222 mg/dL — ABNORMAL HIGH (ref 70–99)
Glucose-Capillary: 218 mg/dL — ABNORMAL HIGH (ref 70–99)
Glucose-Capillary: 297 mg/dL — ABNORMAL HIGH (ref 70–99)
Glucose-Capillary: 418 mg/dL — ABNORMAL HIGH (ref 70–99)
Glucose-Capillary: 265 mg/dL — ABNORMAL HIGH (ref 70–99)

## 2023-02-21 MED ORDER — SODIUM CHLORIDE 0.9 % IV SOLN: Freq: Once | INTRAVENOUS | Status: AC

## 2023-02-21 MED ORDER — MAGNESIUM GLUCONATE 500 MG PO TABS
250.0000 mg | ORAL_TABLET | Freq: Every day | ORAL | Status: DC
  Administered 2023-02-21 – 2023-03-07 (×15): 250 mg via ORAL
  Filled 2023-02-21 (×15): qty 1

## 2023-02-21 MED ORDER — INSULIN DETEMIR 100 UNIT/ML ~~LOC~~ SOLN
25.0000 [IU] | Freq: Every day | SUBCUTANEOUS | Status: DC
  Administered 2023-02-21: 25 [IU] via SUBCUTANEOUS
  Filled 2023-02-21 (×2): qty 0.25

## 2023-02-21 MED ORDER — CALCIUM CARBONATE ANTACID 500 MG PO CHEW
1.0000 | CHEWABLE_TABLET | Freq: Every day | ORAL | Status: DC
  Administered 2023-02-21 – 2023-03-07 (×15): 200 mg via ORAL
  Filled 2023-02-21 (×15): qty 1

## 2023-02-21 MED ORDER — INSULIN ASPART 100 UNIT/ML IJ SOLN
8.0000 [IU] | Freq: Once | INTRAMUSCULAR | Status: AC
  Administered 2023-02-21: 8 [IU] via SUBCUTANEOUS

## 2023-02-21 NOTE — Progress Notes (Signed)
PROGRESS NOTE   Subjective/Complaints: Slept poorly last night because he was woken early for bloodwork, discussed that we will only get bloodwork Monday mornings henceforth  ROS: +insomnia   Objective:   No results found. Recent Labs    02/21/23 0619  WBC 7.8  HGB 14.1  HCT 41.2  PLT 309   Recent Labs    02/21/23 0619  NA 129*  K 4.2  CL 93*  CO2 28  GLUCOSE 219*  BUN 21  CREATININE 0.86  CALCIUM 8.6*    Intake/Output Summary (Last 24 hours) at 02/21/2023 1008 Last data filed at 02/21/2023 0932 Gross per 24 hour  Intake 830 ml  Output 1400 ml  Net -570 ml        Physical Exam: Vital Signs Blood pressure (!) 146/86, pulse 86, temperature 98.2 F (36.8 C), resp. rate 16, height 5\' 4"  (1.626 m), weight 65 kg, SpO2 94 %. Gen: no distress, normal appearing HEENT: oral mucosa pink and moist, NCAT Cardiovascular:     Rate and Rhythm: Normal rate and regular rhythm.     Heart sounds: Normal heart sounds. No murmur heard.    No gallop.     Comments: Sternal noncardiac TTP- some associated bruising- no obvious abrasions Pulmonary:     Effort: Pulmonary effort is normal. No respiratory distress.     Breath sounds: Normal breath sounds. No wheezing or rales.  Abdominal:     General: Bowel sounds are normal. There is no distension.     Palpations: Abdomen is soft.     Tenderness: There is no abdominal tenderness.     Comments: normoactive  Musculoskeletal:     Cervical back: Neck supple. No tenderness.     Comments: RUE 5-/5 in biceps, triceps, WE, grip and FA LUE- 4/5 in same muscles- FA 4-/5 RLE- 5-/5 in HF, KE, DF and PF LLE- HF 1 to 2-/5; DF 3-/5 and PF 3-/5- said too painful to do knee movement  Skin:    General: Skin is warm and dry.     Comments: Peeling on face and head with cradle cap/psoriasis? On head Balding IV L forearm looks OK R AC fossa bruise from prior IV/IV stick Bruise on sternum  from fall  Neurological:     Mental Status: He is alert.     Comments: Decreased to light touch on L side LUE/LLE Slightly delayed responses Ox3 with cues  Psychiatric:     Comments: Flat, focused on food    Assessment/Plan: 1. Functional deficits which require 3+ hours per day of interdisciplinary therapy in a comprehensive inpatient rehab setting. Physiatrist is providing close team supervision and 24 hour management of active medical problems listed below. Physiatrist and rehab team continue to assess barriers to discharge/monitor patient progress toward functional and medical goals  Care Tool:  Bathing              Bathing assist       Upper Body Dressing/Undressing Upper body dressing        Upper body assist      Lower Body Dressing/Undressing Lower body dressing            Lower body  assist       Toileting Toileting    Toileting assist       Transfers Chair/bed transfer  Transfers assist     Chair/bed transfer assist level: Maximal Assistance - Patient 25 - 49%     Locomotion Ambulation   Ambulation assist      Assist level: Moderate Assistance - Patient 50 - 74% Assistive device: Parallel bars Max distance: 17ft   Walk 10 feet activity   Assist  Walk 10 feet activity did not occur: Safety/medical concerns (fatigue and L hip pain)        Walk 50 feet activity   Assist Walk 50 feet with 2 turns activity did not occur: Safety/medical concerns         Walk 150 feet activity   Assist Walk 150 feet activity did not occur: Safety/medical concerns         Walk 10 feet on uneven surface  activity   Assist Walk 10 feet on uneven surfaces activity did not occur: Safety/medical concerns         Wheelchair     Assist Is the patient using a wheelchair?: Yes Type of Wheelchair: Manual    Wheelchair assist level: Dependent - Patient 0% (L hemi)      Wheelchair 50 feet with 2 turns activity    Assist         Assist Level: Dependent - Patient 0%   Wheelchair 150 feet activity     Assist      Assist Level: Dependent - Patient 0%   Blood pressure (!) 146/86, pulse 86, temperature 98.2 F (36.8 C), resp. rate 16, height 5\' 4"  (1.626 m), weight 65 kg, SpO2 94 %.    Medical Problem List and Plan: 1. 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  Antithrombotics: -DVT/anticoagulation:  Pharmaceutical: Lovenox             -antiplatelet therapy: Aspirin and Plavix for three weeks followed by Plavix alone   3. Pain Management: Tylenol, oxycodone as needed- pt doesn't want "pain meds' but willing to try Tylenol- pain 8-9/10- but explained cannot do BC powders   4. Mood/Behavior/Sleep: LCSW to evaluate and provide emotional support             -antipsychotic agents: n/a   5. Neuropsych/cognition: This patient is capable of making decisions on his own behalf.   6. Skin/Wound Care: Routine skin care checks   7. Fluids/Electrolytes/Nutrition: Routine Is and Os and follow-up chemistries   8: Hypertension: monitor TID and prn (home: Hyzaar 100/25 daily, verapamil 240 mg daily). Add magnesium gluconate 250mg  HS   9: Hyperlipidemia: continue statin   10: Peripheral neuropathy: continue gabapentin 600 mg q HS   11:Left greater trochanter fracture: non-operative             -WBAT with a walker             -follow-up with Dr. Joice Lofts  -kpad ordered   12: DM-2: CBGs QID, carb modified diet; home: (Levemir 50 units BID; Victoza 1.8 mg daily, metformin 1000 mg BID) A1c 13.1%             -continue SSI             -  continue Novolog 5 units with meals             -continue Levemir 30 units in AM, 20 units at bedtime             -suggest bringing Victoza in from home.   -provided list of foods for diabetics  -placed order for no drinks but  water, coffee, and tea  13: Hyponatremia:monitor weekly.    14: Elevated bilirubin: follow-up with PCP outpatient.    15: Vitamin D deficiency: weekly supplementation for 8 weeks  >50 minutes spent in review of chart and labs, examination of patient, discussion of insomnia, discussion of performance with PT this morning, providing list of foods for diabetes, discussing diabetic coordinator recommendations  LOS: 1 days A FACE TO FACE EVALUATION WAS PERFORMED  [name] [name] 02/21/2023, 10:08 AM

## 2023-02-21 NOTE — Progress Notes (Signed)
Inpatient Rehabilitation Care Coordinator Assessment and Plan Patient Details  Name: [name] MRN: [id number] Date of Birth: 07/13/1951  Today's Date: 02/21/2023  Hospital Problems: Principal Problem:   Acute arterial ischemic stroke, vertebrobasilar, brainstem, right (HCC) Active Problems:   Diabetic peripheral neuropathy associated with type 2 diabetes mellitus (HCC)   Nondisplaced fracture of greater trochanter of left femur, initial encounter for closed fracture [facility])  Past Medical History:  Past Medical History:  Diagnosis Date   Acute cerebral infarction (HCC) 11/2005   right caudate internal capsule secondary to small vessel   CAD (coronary artery disease)    s/p MI in distant past (1995?); TTE 2010 - EF 55-60%   Chronic kidney disease    per pt, had kidney disease 74-26 years old   Diabetes mellitus without complication (HCC)    Hyperlipidemia    Hypertension    fluctuates   Lacunar infarction (HCC) 2006/2007   Right basal ganglion, chronic lacunar infarct   Myocardial infarction [facility])    ?1995   SBO (small bowel obstruction) (HCC) 09/2010   Resolved with conservative measures/ per pt has had 2 SBO!   Stenosis of right carotid artery    Stroke [facility]) 2010   TIA (transient ischemic attack) 06/2009   Past Surgical History:  Past Surgical History:  Procedure Laterality Date   broken collarbone     right collarbone   broken shoulder     right shoulder   ESOPHAGOGASTRODUODENOSCOPY N/A 12/21/2019   Procedure: ESOPHAGOGASTRODUODENOSCOPY (EGD);  Surgeon: Toledo, Boykin Nearing, MD;  Location: [facility] ENDOSCOPY;  Service: Gastroenterology;  Laterality: N/A;   FINGER SURGERY     right hand, tip of finger mashed and broken fingers   LAPAROSCOPIC CHOLECYSTECTOMY  2009   by Dr. Derrell Lolling   Social History:  reports that he has never smoked. He has never used smokeless tobacco. He reports that he does not drink alcohol and does not use drugs.  Family / Support Systems Marital  Status: Married Patient Roles: Spouse, Parent, Other (Comment) (grandfather) Spouse/Significant Other: Colleen-wife [phone] receiving cancer treatments at [facility] Children: [name]-daughter [phone] Other Supports: Grandson's Anticipated Caregiver: daughter and grandson's Ability/Limitations of Caregiver: Cyrstal works from 5p-9p grandson is there when she is working he is 70 yo Medical laboratory scientific officer: 24/7 Family Dynamics: Close with family who will pull together to make sure he has what is needed at home. He has always been very independent and not used to relying upon others. He wants to do all that he can for himself when he leaves here.  Social History Preferred language: English Religion: Baptist Cultural Background: No issues Education: HS Health Literacy - How often do you need to have someone help you when you read instructions, pamphlets, or other written material from your doctor or pharmacy?: Never Writes: Yes Employment Status: Retired Marine scientist Issues: No issues Guardian/Conservator: None-according to MD pt is capable of making his own decisions while here.   Abuse/Neglect Abuse/Neglect Assessment Can Be Completed: Yes Physical Abuse: Denies Verbal Abuse: Denies Sexual Abuse: Denies Exploitation of patient/patient's resources: Denies Self-Neglect: Denies  Patient response to: Social Isolation - How often do you feel lonely or isolated from those around you?: Never  Emotional Status Pt's affect, behavior and adjustment status: Pt is motivated to do well and recover from this stroke and fracture it does limit him due to the pain it is causing. He will push through and do his best while here. He has some health issues prior but was managing.  Recent Psychosocial Issues: other health issues Psychiatric History: No history may benefit from seeing neuro-psych while here due to stressors in his life Substance Abuse History: No issues  Patient / Family  Perceptions, Expectations & Goals Pt/Family understanding of illness & functional limitations: Pt and daughter can explain his stroke and fracture and have spoken with the MD who are involved and feel they have a good understandng of his plan moving forward. He is glad to be here and hopeful he will do well here. Premorbid pt/family roles/activities: husband, father, grandfather, retiree, friend, home owner, etc Anticipated changes in roles/activities/participation: resume Pt/family expectations/goals: Pt states: " I hope to do well and do for myself."  Daughter states: " We will do what he needs [place] too but he wants to do on his own."  [employer]: None Premorbid Home Care/DME Agencies: None Transportation available at discharge: family Is the patient able to respond to transportation needs?: Yes In the past 12 months, has lack of transportation kept you from medical appointments or from getting medications?: No In the past 12 months, has lack of transportation kept you from meetings, work, or from getting things needed for daily living?: No Resource referrals recommended: Neuropsychology  Discharge Planning Living Arrangements: Children, Other relatives, Spouse/significant other Support Systems: Spouse/significant other, Children, Other relatives, Friends/neighbors Type of Residence: Private residence Insurance Resources: [employer] (part a only) Architect: Tree surgeon, Family Support Financial Screen Referred: No Living Expenses: Lives with family Money Management: Patient, Family Does the patient have any problems obtaining your medications?: No Home Management: daughter Patient/Family Preliminary Plans: Return home with daughtrer and grandson assisting with his care. There is always someone there at the house so he is not worried about being alone. He hopes to be independent at discharge but is aware he may need assist. Care Coordinator Barriers  to Discharge: Insurance for SNF coverage Care Coordinator Anticipated Follow Up Needs: HH/OP  Clinical Impression Pleasant gentleman who is motivated to do well and push through his pain he is having. His daughter and grandson's are involved and will assist at discharge. Work on discharge needs.  Lucy Chris 02/21/2023, 1:30 PM

## 2023-02-21 NOTE — Evaluation (Signed)
Physical Therapy Assessment and Plan  Patient Details  Name: [name] MRN: [id number] Date of Birth: 1951-03-01  PT Diagnosis: Abnormal posture, Abnormality of gait, Difficulty walking, Hemiplegia non-dominant, Impaired sensation, Muscle weakness, and Pain in L hip Rehab Potential: Good ELOS: 2 weeks   Today's Date: 02/21/2023 PT Individual Time: 0800-0913 PT Individual Time Calculation (min): 73 min    Hospital Problem: Principal Problem:   Acute arterial ischemic stroke, vertebrobasilar, brainstem, right (HCC) Active Problems:   Diabetic peripheral neuropathy associated with type 2 diabetes mellitus (HCC)   Nondisplaced fracture of greater trochanter of left femur, initial encounter for closed fracture [facility])   Past Medical History:  Past Medical History:  Diagnosis Date   Acute cerebral infarction (HCC) 11/2005   right caudate internal capsule secondary to small vessel   CAD (coronary artery disease)    s/p MI in distant past (1995?); TTE 2010 - EF 55-60%   Chronic kidney disease    per pt, had kidney disease 15-66 years old   Diabetes mellitus without complication (HCC)    Hyperlipidemia    Hypertension    fluctuates   Lacunar infarction (HCC) 2006/2007   Right basal ganglion, chronic lacunar infarct   Myocardial infarction [facility])    ?1995   SBO (small bowel obstruction) (HCC) 09/2010   Resolved with conservative measures/ per pt has had 2 SBO!   Stenosis of right carotid artery    Stroke [facility]) 2010   TIA (transient ischemic attack) 06/2009   Past Surgical History:  Past Surgical History:  Procedure Laterality Date   broken collarbone     right collarbone   broken shoulder     right shoulder   ESOPHAGOGASTRODUODENOSCOPY N/A 12/21/2019   Procedure: ESOPHAGOGASTRODUODENOSCOPY (EGD);  Surgeon: Toledo, Boykin Nearing, MD;  Location: [facility] ENDOSCOPY;  Service: Gastroenterology;  Laterality: N/A;   FINGER SURGERY     right hand, tip of finger mashed and broken fingers    LAPAROSCOPIC CHOLECYSTECTOMY  2009   by Dr. Derrell Lolling    Assessment & Plan Clinical Impression: Patient is a 72 year old male who presented to the ED at [facility] who fell to ground after his left leg gave way. He was walking home and developed BLE weakness and fell in his yard. He complained of left hip pain. Workup revealed WBC 14.5, BP 153/91, HR 103. CT head and chest performed and without acute abnormality. CT of left hip with minimally displaced left greater trochanter fracture. Orthopedic surgery consulted and managed non-operatively. He is WBAT with a walker. He has a history of DM and BLE peripheral neuropathy. History of prior stroke on aspirin. MRI performed showed probable acute ischemic punctate infarct in the anteromedial aspect of the right cerebral peduncle, artifact could not be ruled out given that there is no associated FLAIR signal abnormality. Neurology consulted and placed on Plavix as well as aspirin. DAPT for 21 days followed by Plavix alone. A1c 13.1%. Diabetic coordinator consulted for uncontrolled diabetes. Hypertension history at home on Hyzaar and verapamil. 2D echo with EF ~60-65%. He is tolerating a regular diet. The patient requires inpatient medicine and rehabilitation evaluations and services for ongoing dysfunction secondary to left greater trochanter fracture and small infarct of the R cerebral peduncle.  Patient transferred to CIR on 02/20/2023 .   Patient currently requires max with mobility secondary to muscle weakness and muscle joint tightness, decreased cardiorespiratoy endurance, unbalanced muscle activation, and decreased sitting balance, decreased standing balance, decreased postural control, hemiplegia, and decreased balance  strategies.  Prior to hospitalization, patient was independent  with mobility and lived with Daughter (daughter works 5pm-10pm M-F) in a House home.  Home access is 3Stairs to enter.  Patient will benefit from skilled PT intervention to maximize safe  functional mobility, minimize fall risk, and decrease caregiver burden for planned discharge home with intermittent assist.  Anticipate patient will benefit from follow up [facility] at discharge.  PT - End of Session Activity Tolerance: Tolerates 10 - 20 min activity with multiple rests Endurance Deficit: Yes Endurance Deficit Description: quick to fatigue - needs seated rest breaks with simple mobility tasks PT Assessment Rehab Potential (ACUTE/IP ONLY): Good PT Barriers to Discharge: Decreased caregiver support;Home environment access/layout;Lack of/limited family support;Insurance for SNF coverage;Incontinence PT Patient demonstrates impairments in the following area(s): Balance;Endurance;Motor;Pain;Safety;Sensory;Skin Integrity PT Transfers Functional Problem(s): Bed Mobility;Bed to Chair;Car PT Locomotion Functional Problem(s): Ambulation;Stairs;Wheelchair Mobility PT Plan PT Intensity: Minimum of 1-2 x/day ,45 to 90 minutes PT Frequency: 5 out of 7 days PT Duration Estimated Length of Stay: 2 weeks PT Treatment/Interventions: Ambulation/gait training;Discharge planning;Functional mobility training;Psychosocial support;Therapeutic Activities;Visual/perceptual remediation/compensation;Wheelchair propulsion/positioning;Therapeutic Exercise;Skin care/wound management;Neuromuscular re-education;Disease management/prevention;Balance/vestibular training;Cognitive remediation/compensation;DME/adaptive equipment instruction;Pain management;Splinting/orthotics;UE/LE Strength taining/ROM;Stair training;Patient/family education;Community reintegration;UE/LE Coordination activities;Functional electrical stimulation PT Transfers Anticipated Outcome(s): CGA with LRAD PT Locomotion Anticipated Outcome(s): CGA with LRAD PT Recommendation Recommendations for Other Services: Neuropsych consult Follow Up Recommendations: Home health PT;24 hour supervision/assistance Patient destination: Home Equipment Recommended:  To be determined   PT Evaluation Precautions/Restrictions Precautions Precautions: Fall Precaution Comments: L hip fx managed non-op, L hemi Restrictions Weight Bearing Restrictions: No LLE Weight Bearing: Weight bearing as tolerated Pain Pain Assessment Pain Scale: 0-10 Pain Score: 5  Pain Type: Acute pain Pain Location: Leg Pain Orientation: Left Pain Descriptors / Indicators: Aching Pain Onset: On-going Pain Intervention(s): Ambulation/increased activity;Repositioned;RN made aware Pain Interference Pain Interference Pain Effect on Sleep: 1. Rarely or not at all Pain Interference with Therapy Activities: 4. Almost constantly Pain Interference with Day-to-Day Activities: 4. Almost constantly Home Living/Prior Functioning Home Living Available Help at Discharge: Available PRN/intermittently Type of Home: House Home Access: Stairs to enter Entrance Stairs-Number of Steps: 3 Entrance Stairs-Rails: None Home Layout: One level Bathroom Shower/Tub: Forensic scientist: Standard Bathroom Accessibility: Yes  Lives With: Daughter (daughter works 5pm-10pm M-F) Prior Function Level of Independence: Independent with gait;Independent with transfers;Independent with homemaking with ambulation  Able to Take Stairs?: Yes Driving: Yes Vocation: Retired Gaffer: Education administrator Leisure: Hobbies-yes (Comment) (Hangs out at the local gas station. Daughter drops him off and he walks back home. Goes daily) Vision/Perception  Vision - History Ability to See in Adequate Light: 0 Adequate Perception Perception: Within Functional Limits Praxis Praxis: Intact  Cognition Overall Cognitive Status: Within Functional Limits for tasks assessed Arousal/Alertness: Awake/alert Orientation Level: Oriented X4 Attention: Focused;Sustained;Selective Focused Attention: Appears intact Sustained Attention: Appears intact Selective Attention: Appears intact Memory: Appears  intact Awareness: Appears intact Problem Solving: Appears intact Safety/Judgment: Appears intact Comments: Will continue to assess in functional context Sensation Sensation Light Touch: Impaired Detail Peripheral sensation comments: Baseline neuropathy in both feet; worse in the left. Hot/Cold: Appears Intact Proprioception: Appears Intact Stereognosis: Appears Intact Coordination Gross Motor Movements are Fluid and Coordinated: No Coordination and Movement Description: L hemi Heel Shin Test: UTA on L due to pain/weakness Motor  Motor Motor: Hemiplegia;Abnormal postural alignment and control;Other (comment) Motor - Skilled Clinical Observations: L hemi, Generalized weakness and deconditioning, pain inhibiting movement patterns 2/2 hip fx   Trunk/Postural Assessment  Cervical Assessment Cervical Assessment: Exceptions  to [facility] (forward head) Thoracic Assessment Thoracic Assessment: Exceptions to [facility] (rounded shoulders; depressed on L) Lumbar Assessment Lumbar Assessment: Exceptions to [facility] (posterior tilt) Postural Control Postural Control: Deficits on evaluation Trunk Control: trunk lean L in sitting and standing. Righting Reactions: Insufficient stepping strategies due to weakness and pain in L hip  Balance Balance Balance Assessed: Yes Static Sitting Balance Static Sitting - Balance Support: Feet supported;No upper extremity supported Static Sitting - Level of Assistance: 4: Min assist Dynamic Sitting Balance Dynamic Sitting - Balance Support: Feet supported;No upper extremity supported Dynamic Sitting - Level of Assistance: 3: Mod assist Static Standing Balance Static Standing - Balance Support: Bilateral upper extremity supported Static Standing - Level of Assistance: 3: Mod assist Dynamic Standing Balance Dynamic Standing - Balance Support: Bilateral upper extremity supported;During functional activity Dynamic Standing - Level of Assistance: 2: Max assist Extremity  Assessment      RLE Assessment RLE Assessment: Exceptions to [facility] General Strength Comments: Grossly 4/5 LLE Assessment LLE Assessment: Exceptions to [facility] LLE Strength LLE Overall Strength: Deficits Left Hip Flexion: 2-/5 Left Hip ABduction: 2-/5 Left Hip ADduction: 2-/5 Left Knee Flexion: 2-/5 Left Knee Extension: 2/5 Left Ankle Dorsiflexion: 2+/5  Care Tool Care Tool Bed Mobility Roll left and right activity   Roll left and right assist level: Maximal Assistance - Patient 25 - 49%    Sit to lying activity   Sit to lying assist level: Maximal Assistance - Patient 25 - 49%    Lying to sitting on side of bed activity   Lying to sitting on side of bed assist level: the ability to move from lying on the back to sitting on the side of the bed with no back support.: Maximal Assistance - Patient 25 - 49%     Care Tool Transfers Sit to stand transfer   Sit to stand assist level: Maximal Assistance - Patient 25 - 49%    Chair/bed transfer   Chair/bed transfer assist level: Maximal Assistance - Patient 25 - 49%     Scientist, research (physical sciences) transfer activity did not occur: Safety/medical concerns        Care Tool Locomotion Ambulation   Assist level: Moderate Assistance - Patient 50 - 74% Assistive device: Parallel bars Max distance: 52ft  Walk 10 feet activity Walk 10 feet activity did not occur: Safety/medical concerns (fatigue and L hip pain)       Walk 50 feet with 2 turns activity Walk 50 feet with 2 turns activity did not occur: Safety/medical concerns      Walk 150 feet activity Walk 150 feet activity did not occur: Safety/medical concerns      Walk 10 feet on uneven surfaces activity Walk 10 feet on uneven surfaces activity did not occur: Safety/medical concerns      Stairs Stair activity did not occur: Safety/medical concerns        Walk up/down 1 step activity Walk up/down 1 step or curb (drop down) activity did not occur: Safety/medical  concerns      Walk up/down 4 steps activity Walk up/down 4 steps activity did not occur: Safety/medical concerns      Walk up/down 12 steps activity Walk up/down 12 steps activity did not occur: Safety/medical concerns      Pick up small objects from floor Pick up small object from the floor (from standing position) activity did not occur: Safety/medical concerns      Wheelchair Is the patient  using a wheelchair?: Yes Type of Wheelchair: Manual   Wheelchair assist level: Dependent - Patient 0% (L hemi)    Wheel 50 feet with 2 turns activity   Assist Level: Dependent - Patient 0%  Wheel 150 feet activity   Assist Level: Dependent - Patient 0%    Refer to Care Plan for Long Term Goals  SHORT TERM GOAL WEEK 1 PT Short Term Goal 1 (Week 1): Pt will complete bed mobility with modA PT Short Term Goal 2 (Week 1): Pt will complete bed<>chair transfers with modA and LRAD PT Short Term Goal 3 (Week 1): Pt will ambulate 18ft with modA and LRAD PT Short Term Goal 4 (Week 1): Pt will initiate stair training  Recommendations for other services: Neuropsych  Skilled Therapeutic Intervention Mobility Bed Mobility Bed Mobility: Rolling Right;Rolling Left;Supine to Sit;Sit to Supine Rolling Right: Maximal Assistance - Patient 25-49% Rolling Left: Maximal Assistance - Patient 25-49% Supine to Sit: Maximal Assistance - Patient - Patient 25-49% Sit to Supine: Maximal Assistance - Patient 25-49% Transfers Transfers: Sit to Stand;Stand to Sit;Squat Pivot Transfers Sit to Stand: Maximal Assistance - Patient 25-49% Stand to Sit: Maximal Assistance - Patient 25-49% Squat Pivot Transfers: Maximal Assistance - Patient 25-49% Transfer (Assistive device): None Locomotion  Gait Ambulation: Yes Gait Assistance: Moderate Assistance - Patient 50-74% Gait Distance (Feet): 8 Feet Assistive device: Parallel bars Gait Assistance Details: Tactile cues for initiation;Tactile cues for placement;Tactile  cues for sequencing;Tactile cues for weight shifting;Tactile cues for posture;Verbal cues for technique;Verbal cues for precautions/safety;Verbal cues for sequencing;Verbal cues for gait pattern;Visual cues/gestures for sequencing;Manual facilitation for weight shifting Gait Gait: Yes Gait Pattern: Impaired Gait Pattern: Step-to pattern;Decreased stance time - left;Decreased step length - left;Decreased step length - right;Decreased hip/knee flexion - left;Narrow base of support;Trunk flexed;Lateral trunk lean to left;Poor foot clearance - left Stairs / Additional Locomotion Stairs: No Wheelchair Mobility Wheelchair Mobility: Yes Wheelchair Assistance: Dependent - Patient 0% Wheelchair Parts Management: Needs assistance  Skilled Intervention: Pt supine in bed sleeping to start - agreeable to PT evaluation. Initially groggy and lethargic but wakens with mobility and change of environment. Pt with flat affect, A&Ox4. Initiated functional mobility as outlined above. Pt incontinent of urine with soaked brief and sheets. Brief change at bed level with totalA, maxA for rolling which was painful. NT notified for linen change. Retrieved 16x16 ROHO cushion and L 1/2 lap tray for paretic limb. Pt requires maxA overall for functional mobility, primarily limited by L sided weakness and L hip pain. Pt c/o feeling dizzy in standing - unable to take in standing but notified MD at end of session. Unable to assess stairs or car transfers due to safety concerns and fatigue. Concluded session in recliner with BLE elevated and pillow supporting LUE. Safety belt alarm on, call bell within reach.   Instructed pt in results of PT evaluation as detailed above, PT POC, rehab potential, rehab goals, and discharge recommendations. Additionally discussed CIR's policies regarding fall safety and use of chair alarm and/or quick release belt. Pt verbalized understanding and in agreement. Will update pt's family members as they  become available.   Discharge Criteria: Patient will be discharged from PT if patient refuses treatment 3 consecutive times without medical reason, if treatment goals not met, if there is a change in medical status, if patient makes no progress towards goals or if patient is discharged from hospital.  The above assessment, treatment plan, treatment alternatives and goals were discussed and mutually agreed upon: by patient  Conley Delisle P Lily Kernen  PT, DPT, CSRS  02/21/2023, 9:15 AM

## 2023-02-21 NOTE — Plan of Care (Signed)
  Problem: RH Balance Goal: LTG Patient will maintain dynamic standing with ADLs (OT) Description: LTG:  Patient will maintain dynamic standing balance with assist during activities of daily living (OT)  Flowsheets (Taken 02/21/2023 1608) LTG: Pt will maintain dynamic standing balance during ADLs with: Supervision/Verbal cueing   Problem: Sit to Stand Goal: LTG:  Patient will perform sit to stand in prep for activites of daily living with assistance level (OT) Description: LTG:  Patient will perform sit to stand in prep for activites of daily living with assistance level (OT) Flowsheets (Taken 02/21/2023 1608) LTG: PT will perform sit to stand in prep for activites of daily living with assistance level: Supervision/Verbal cueing   Problem: RH Bathing Goal: LTG Patient will bathe all body parts with assist levels (OT) Description: LTG: Patient will bathe all body parts with assist levels (OT) Flowsheets (Taken 02/21/2023 1608) LTG: Pt will perform bathing with assistance level/cueing: Minimal Assistance - Patient > 75% LTG: Position pt will perform bathing: Shower   Problem: RH Dressing Goal: LTG Patient will perform upper body dressing (OT) Description: LTG Patient will perform upper body dressing with assist, with/without cues (OT). Flowsheets (Taken 02/21/2023 1608) LTG: Pt will perform upper body dressing with assistance level of: Independent Goal: LTG Patient will perform lower body dressing w/assist (OT) Description: LTG: Patient will perform lower body dressing with assist, with/without cues in positioning using equipment (OT) Flowsheets (Taken 02/21/2023 1608) LTG: Pt will perform lower body dressing with assistance level of: Minimal Assistance - Patient > 75%   Problem: RH Toileting Goal: LTG Patient will perform toileting task (3/3 steps) with assistance level (OT) Description: LTG: Patient will perform toileting task (3/3 steps) with assistance level (OT)  Flowsheets (Taken 02/21/2023  1608) LTG: Pt will perform toileting task (3/3 steps) with assistance level: Minimal Assistance - Patient > 75%   Problem: RH Functional Use of Upper Extremity Goal: LTG Patient will use RT/LT upper extremity as a (OT) Description: LTG: Patient will use right/left upper extremity as a stabilizer/gross assist/diminished/nondominant/dominant level with assist, with/without cues during functional activity (OT) Flowsheets (Taken 02/21/2023 1608) LTG: Use of upper extremity in functional activities: LUE as diminished level LTG: Pt will use upper extremity in functional activity with assistance level of: Minimal Assistance - Patient > 75%   Problem: RH Toilet Transfers Goal: LTG Patient will perform toilet transfers w/assist (OT) Description: LTG: Patient will perform toilet transfers with assist, with/without cues using equipment (OT) Flowsheets (Taken 02/21/2023 1608) LTG: Pt will perform toilet transfers with assistance level of: Minimal Assistance - Patient > 75%   Problem: RH Tub/Shower Transfers Goal: LTG Patient will perform tub/shower transfers w/assist (OT) Description: LTG: Patient will perform tub/shower transfers with assist, with/without cues using equipment (OT) Flowsheets (Taken 02/21/2023 1608) LTG: Pt will perform tub/shower stall transfers with assistance level of: Minimal Assistance - Patient > 75%

## 2023-02-21 NOTE — Progress Notes (Signed)
Meal coverage and am levemir d/c this am which has resulted in BS trending steadily up to 400+ this evening. Per nursing reports being hungry and has been eating well. BS last 24 hours 273-->222-->218-->297-->418. May need bid Levemir for more consistent coverage. Novolog 8 units ordered with 500 cc IVF at 100 cc/hr for hydration. Will recheck labs in am. Pt with chronic hyponatremia around 132 but has had drop in past 3 days. Will recheck iCMET in am as TB elevated--has had cholecystectomy. Marland Kitchen

## 2023-02-21 NOTE — Plan of Care (Signed)
  Problem: RH Balance Goal: LTG Patient will maintain dynamic standing balance (PT) Description: LTG:  Patient will maintain dynamic standing balance with assistance during mobility activities (PT) Flowsheets (Taken 02/21/2023 0910) LTG: Pt will maintain dynamic standing balance during mobility activities with:: Contact Guard/Touching assist   Problem: Sit to Stand Goal: LTG:  Patient will perform sit to stand with assistance level (PT) Description: LTG:  Patient will perform sit to stand with assistance level (PT) Flowsheets (Taken 02/21/2023 0910) LTG: PT will perform sit to stand in preparation for functional mobility with assistance level: Contact Guard/Touching assist   Problem: RH Bed Mobility Goal: LTG Patient will perform bed mobility with assist (PT) Description: LTG: Patient will perform bed mobility with assistance, with/without cues (PT). Flowsheets (Taken 02/21/2023 0910) LTG: Pt will perform bed mobility with assistance level of: Contact Guard/Touching assist   Problem: RH Bed to Chair Transfers Goal: LTG Patient will perform bed/chair transfers w/assist (PT) Description: LTG: Patient will perform bed to chair transfers with assistance (PT). Flowsheets (Taken 02/21/2023 0910) LTG: Pt will perform Bed to Chair Transfers with assistance level: Contact Guard/Touching assist   Problem: RH Car Transfers Goal: LTG Patient will perform car transfers with assist (PT) Description: LTG: Patient will perform car transfers with assistance (PT). Flowsheets (Taken 02/21/2023 0910) LTG: Pt will perform car transfers with assist:: Minimal Assistance - Patient > 75%   Problem: RH Ambulation Goal: LTG Patient will ambulate in controlled environment (PT) Description: LTG: Patient will ambulate in a controlled environment, # of feet with assistance (PT). Flowsheets (Taken 02/21/2023 0910) LTG: Pt will ambulate in controlled environ  assist needed:: Contact Guard/Touching assist LTG: Ambulation  distance in controlled environment: 141ft Goal: LTG Patient will ambulate in home environment (PT) Description: LTG: Patient will ambulate in home environment, # of feet with assistance (PT). Flowsheets (Taken 02/21/2023 0910) LTG: Pt will ambulate in home environ  assist needed:: Contact Guard/Touching assist LTG: Ambulation distance in home environment: 78ft   Problem: RH Stairs Goal: LTG Patient will ambulate up and down stairs w/assist (PT) Description: LTG: Patient will ambulate up and down # of stairs with assistance (PT) Flowsheets (Taken 02/21/2023 0910) LTG: Pt will ambulate up/down stairs assist needed:: Minimal Assistance - Patient > 75% LTG: Pt will  ambulate up and down number of stairs: 3 without rails or as per home setup

## 2023-02-21 NOTE — Progress Notes (Signed)
Patient ID: [name], male   DOB: 05-Jan-1951, 72 y.o.   MRN: [id number] Met with pt for team conference update regarding goals of CGA-min level and target discharge date of 5/16. Have left a message for his daughter will await her return call, to answer any questions she has

## 2023-02-21 NOTE — Patient Care Conference (Signed)
Inpatient RehabilitationTeam Conference and Plan of Care Update Date: 02/21/2023   Time: 11:07 AM    Patient Name: [name]      Medical Record Number: [id number]  Date of Birth: [birth year] Sex: Male         Room/Bed: 4M01C/4M01C-01 Payor Info: Payor: MEDICARE / Plan: MEDICARE PART A / Product Type: *No Product type* /    Admit Date/Time:  02/20/2023 12:07 PM  Primary Diagnosis:  Acute arterial ischemic stroke, vertebrobasilar, brainstem, right [facility])  Hospital Problems: Principal Problem:   Acute arterial ischemic stroke, vertebrobasilar, brainstem, right (HCC) Active Problems:   Diabetic peripheral neuropathy associated with type 2 diabetes mellitus (HCC)   Nondisplaced fracture of greater trochanter of left femur, initial encounter for closed fracture [facility])    Expected Discharge Date: Expected Discharge Date: 03/08/23  Team Members Present: Physician leading conference: Dr. Sula Soda Social Worker Present: Dossie Der, LCSW Nurse Present: Chana Bode, RN PT Present: Wynelle Link, PT OT Present: Bretta Bang, OT PPS Coordinator present : Fae Pippin, SLP     Current Status/Progress Goal Weekly Team Focus  Bowel/Bladder   Pt is continent B/B LBM 02/16/23 per shift report   Pt will maintainnormal B/B pattern   Assist with toileting qshift/prn    Swallow/Nutrition/ Hydration               ADL's                Mobility   PT eval pending           Communication                Safety/Cognition/ Behavioral Observations               Pain   Deniies pain at this time. Pain well managed with current prn pain medications   Pt will be free from pain   Assess pt for pain qshift/prn    Skin   Skin is intact   Pt will maintain skin intergrity with no breakdown  Assess pt skin for breakdown qshift/prn      Discharge Planning:  New evaluation-return home witj daughter who works 5p-9p will be alone during this time   Team  Discussion: Patient with dizziness upon standing post CVA with history of uncontrolled DM, low protein level, new hyponatremia and hypocalcemia.  Patient on target to meet rehab goals: yes, currently needs max assist for lower body care; favors left hip during activity.  Needs max assist for bed mobility. Able to ambulate up to 8' in the parallel bars with mod assist. Goals for discharge set for min assist overall.  *See Care Plan and progress notes for long and short-term goals.   Revisions to Treatment Plan:  N/a   Teaching Needs: Safety, medications, dietary modifications, transfers, toileting, skin care, etc.  Current Barriers to Discharge: Decreased caregiver support  Possible Resolutions to Barriers: Family education     Medical Summary Current Status: uncontrolled type 2 daibetes, hypertension, dizziness with standing, hyponatremia  Barriers to Discharge: Medical stability  Barriers to Discharge Comments: uncontrolled type 2 daibetes, hypertension, dizziness with standing, hyponatremia Possible Resolutions to [employer]: adjust insulin regimen, add magnesium gluconate 250mg  HS, ecnouraged adequate hydration in the morning, monitor Na weekly, limit labs to weekly if possible   Continued Need for Acute Rehabilitation Level of Care: The patient requires daily medical management by a physician with specialized training in physical medicine and rehabilitation for the following reasons: Direction of a  multidisciplinary physical rehabilitation program to maximize functional independence : Yes Medical management of patient stability for increased activity during participation in an intensive rehabilitation regime.: Yes Analysis of laboratory values and/or radiology reports with any subsequent need for medication adjustment and/or medical intervention. : Yes   I attest that I was present, lead the team conference, and concur with the assessment and plan of the  team.   Chana Bode B 02/21/2023, 2:49 PM

## 2023-02-21 NOTE — Progress Notes (Signed)
Inpatient Rehabilitation Center Individual Statement of Services  Patient Name:  [name]  Date:  02/21/2023  Welcome to the Inpatient Rehabilitation Center.  Our goal is to provide you with an individualized program based on your diagnosis and situation, designed to meet your specific needs.  With this comprehensive rehabilitation program, you will be expected to participate in at least 3 hours of rehabilitation therapies Monday-Friday, with modified therapy programming on the weekends.  Your rehabilitation program will include the following services:  Physical Therapy (PT), Occupational Therapy (OT), Speech Therapy (ST), 24 hour per day rehabilitation nursing, Therapeutic Recreaction (TR), Care Coordinator, Rehabilitation Medicine, Nutrition Services, and Pharmacy Services  Weekly team conferences will be held on Wednesday to discuss your progress.  Your Inpatient Rehabilitation Care Coordinator will talk with you frequently to get your input and to update you on team discussions.  Team conferences with you and your family in attendance may also be held.  Expected length of stay: 2 weeks  Overall anticipated outcome: min-CGA level  Depending on your progress and recovery, your program may change. Your Inpatient Rehabilitation Care Coordinator will coordinate services and will keep you informed of any changes. Your Inpatient Rehabilitation Care Coordinator's name and contact numbers are listed  below.  The following services may also be recommended but are not provided by the Inpatient Rehabilitation Center:  Driving Evaluations Home Health Rehabiltiation Services Outpatient Rehabilitation Services    Arrangements will be made to provide these services after discharge if needed.  Arrangements include referral to agencies that provide these services.  Your insurance has been verified to be:  Medicare part A only Your primary doctor is:  Kem Parkinson  Pertinent information will be  shared with your doctor and your insurance company.  Inpatient Rehabilitation Care Coordinator:  Dossie Der, [place] [phone] or ([phone]  Information discussed with and copy given to patient by: Lucy Chris, 02/21/2023, 1:16 PM

## 2023-02-21 NOTE — Evaluation (Signed)
Occupational Therapy Assessment and Plan  Patient Details  Name: [name] MRN: [id number] Date of Birth: 1951-03-29  OT Diagnosis: acute pain, cognitive deficits, hemiplegia affecting non-dominant side, muscle weakness (generalized), and pain in joint Rehab Potential: Rehab Potential (ACUTE ONLY): Good ELOS: 14-16 days   Today's Date: 02/21/2023 OT Individual Time: 1003-1100 and 1345-1455 OT Individual Time Calculation (min): 57 min and 70 min  Hospital Problem: Principal Problem:   Acute arterial ischemic stroke, vertebrobasilar, brainstem, right (HCC) Active Problems:   Diabetic peripheral neuropathy associated with type 2 diabetes mellitus (HCC)   Nondisplaced fracture of greater trochanter of left femur, initial encounter for closed fracture [facility])   Past Medical History:  Past Medical History:  Diagnosis Date   Acute cerebral infarction (HCC) 11/2005   right caudate internal capsule secondary to small vessel   CAD (coronary artery disease)    s/p MI in distant past (1995?); TTE 2010 - EF 55-60%   Chronic kidney disease    per pt, had kidney disease 59-4 years old   Diabetes mellitus without complication (HCC)    Hyperlipidemia    Hypertension    fluctuates   Lacunar infarction (HCC) 2006/2007   Right basal ganglion, chronic lacunar infarct   Myocardial infarction [facility])    ?1995   SBO (small bowel obstruction) (HCC) 09/2010   Resolved with conservative measures/ per pt has had 2 SBO!   Stenosis of right carotid artery    Stroke [facility]) 2010   TIA (transient ischemic attack) 06/2009   Past Surgical History:  Past Surgical History:  Procedure Laterality Date   broken collarbone     right collarbone   broken shoulder     right shoulder   ESOPHAGOGASTRODUODENOSCOPY N/A 12/21/2019   Procedure: ESOPHAGOGASTRODUODENOSCOPY (EGD);  Surgeon: Toledo, Boykin Nearing, MD;  Location: [facility] ENDOSCOPY;  Service: Gastroenterology;  Laterality: N/A;   FINGER SURGERY     right hand,  tip of finger mashed and broken fingers   LAPAROSCOPIC CHOLECYSTECTOMY  2009   by Dr. Derrell Lolling    Assessment & Plan Clinical Impression: Patient is a 72 y.o. year old male with recent admission to the hospital on 02/16/23 s/p fall with left greater trochanter fracture managed non-operatively and small infarct of the R cerebral peduncle .  Patient transferred to CIR on 02/20/2023 .    Patient currently requires max with basic self-care skills secondary to muscle weakness, abnormal tone, decreased coordination, and decreased motor planning, decreased initiation, decreased attention, decreased problem solving, decreased safety awareness, and decreased memory, and decreased sitting balance, decreased standing balance, decreased postural control, hemiplegia, and decreased balance strategies.  Prior to hospitalization, patient could complete BADL with independent .  Patient will benefit from skilled intervention to decrease level of assist with basic self-care skills and increase independence with basic self-care skills prior to discharge home with care partner.  Anticipate patient will require minimal physical assistance and follow up home health.  OT - End of Session Activity Tolerance: Decreased this session Endurance Deficit: Yes Endurance Deficit Description: quick to fatigue - needs seated rest breaks with simple mobility tasks OT Assessment Rehab Potential (ACUTE ONLY): Good OT Patient demonstrates impairments in the following area(s): Balance;Behavior;Safety;Cognition;Sensory;Endurance;Motor;Pain OT Basic ADL's Functional Problem(s): Bathing;Dressing;Toileting OT Transfers Functional Problem(s): Toilet;Tub/Shower OT Additional Impairment(s): Fuctional Use of Upper Extremity OT Plan OT Intensity: Minimum of 1-2 x/day, 45 to 90 minutes OT Frequency: 5 out of 7 days OT Duration/Estimated Length of Stay: 14-16 days OT Treatment/Interventions: Balance/vestibular training;Discharge planning;Pain  management;Self Care/advanced ADL retraining;Therapeutic Activities;UE/LE Coordination activities;Cognitive remediation/compensation;Disease mangement/prevention;Functional mobility training;Patient/family education;Therapeutic Exercise;DME/adaptive equipment instruction;Neuromuscular re-education;Splinting/orthotics;UE/LE Strength taining/ROM OT Self Feeding Anticipated Outcome(s): Independent OT Basic Self-Care Anticipated Outcome(s): Min assist OT Toileting Anticipated Outcome(s): Min assist OT Bathroom Transfers Anticipated Outcome(s): Min assist OT Recommendation Recommendations for Other Services: Neuropsych consult Patient destination: Home Follow Up Recommendations: Home health OT Equipment Recommended: To be determined   OT Evaluation Precautions/Restrictions  Precautions Precautions: Fall Precaution Comments: L hip fx managed non-op, L hemi Restrictions Weight Bearing Restrictions: No LLE Weight Bearing: Weight bearing as tolerated  Pain Pain Assessment Pain Score: 0-No pain Home Living/Prior Functioning Home Living Living Arrangements: Children, Other relatives, Spouse/significant other Available Help at Discharge: Available PRN/intermittently Type of Home: House Home Access: Stairs to enter Secretary/administrator of Steps: 3 Entrance Stairs-Rails: None Home Layout: One level Bathroom Shower/Tub: Tub/shower unit, Engineer, building services: Pharmacist, community: Yes  Lives With: Daughter Prior Function Level of Independence: Independent with gait, Independent with transfers, Independent with homemaking with ambulation, Independent with basic ADLs  Able to Take Stairs?: Yes Driving: Yes Vocation: Retired Gaffer: Education administrator Leisure: Hobbies-yes (Comment) Vision Baseline Vision/History: 1 Wears glasses Ability to See in Adequate Light: 0 Adequate Patient Visual Report: No change from baseline (H/O Glaucoma) Vision Assessment?: No apparent  visual deficits Perception  Perception: Within Functional Limits Praxis Praxis: Intact Cognition Cognition Overall Cognitive Status: Impaired/Different from baseline Arousal/Alertness: Awake/alert Orientation Level: Person;Place;Situation Person: Oriented Place: Oriented Situation: Oriented Memory: Impaired Memory Impairment: Retrieval deficit Attention: Sustained Focused Attention: Appears intact Sustained Attention: Impaired Sustained Attention Impairment: Functional complex;Functional basic Selective Attention: Impaired Selective Attention Impairment: Functional basic Awareness: Impaired Awareness Impairment: Emergent impairment Problem Solving: Impaired Problem Solving Impairment: Functional complex Executive Function: Decision Making;Self Monitoring Decision Making: Impaired Decision Making Impairment: Functional complex Self Monitoring: Impaired Self Monitoring Impairment: Functional complex;Functional basic Behaviors: Poor frustration tolerance Safety/Judgment: Impaired Comments: Will continue to assess in functional context Brief Interview for Mental Status (BIMS) Repetition of Three Words (First Attempt): 3 Temporal Orientation: Year: Missed by 2 to 5 years Temporal Orientation: Month: Accurate within 5 days Temporal Orientation: Day: Incorrect Recall: "Sock": Yes, after cueing ("something to wear") Recall: "Blue": Yes, after cueing ("a color") Recall: "Bed": Yes, after cueing ("a piece of furniture") BIMS Summary Score: 9 Sensation Sensation Light Touch: Appears Intact (BUE) Peripheral sensation comments: Baseline neuropathy in both feet; worse in the left. Hot/Cold: Appears Intact Proprioception: Appears Intact Stereognosis: Appears Intact Coordination Gross Motor Movements are Fluid and Coordinated: No Fine Motor Movements are Fluid and Coordinated: No Coordination and Movement Description: L hemi 9 Hole Peg Test: 1:35:56 Left   41:33 Right     Box and  Blocks   16 Left  30 Right Motor  Motor Motor: Hemiplegia;Abnormal postural alignment and control;Other (comment) Motor - Skilled Clinical Observations: L hemi, Generalized weakness and deconditioning, pain inhibiting movement patterns 2/2 hip fx  Trunk/Postural Assessment  Cervical Assessment Cervical Assessment: Exceptions to [facility] Thoracic Assessment Thoracic Assessment: Exceptions to [facility] Lumbar Assessment Lumbar Assessment: Exceptions to [facility] Postural Control Postural Control: Deficits on evaluation Trunk Control: trunk lean L in sitting and standing. Righting Reactions: Insufficient stepping strategies due to weakness and pain in L hip  Balance Balance Balance Assessed: Yes Static Sitting Balance Static Sitting - Balance Support: Feet supported;No upper extremity supported Static Sitting - Level of Assistance: 4: Min assist Dynamic Sitting Balance Dynamic Sitting - Balance Support: Feet supported;No upper extremity supported Dynamic Sitting - Level of Assistance: 3: Mod assist  Static Standing Balance Static Standing - Balance Support: Bilateral upper extremity supported;During functional activity Static Standing - Level of Assistance: 2: Max assist Dynamic Standing Balance Dynamic Standing - Balance Support: Bilateral upper extremity supported;During functional activity Dynamic Standing - Level of Assistance: 2: Max assist Extremity/Trunk Assessment RUE Assessment RUE Assessment: Within Functional Limits LUE Assessment LUE Assessment: Exceptions to [facility] Passive Range of Motion (PROM) Comments: WFL Active Range of Motion (AROM) Comments: ~120* shoulder flex with elbow extension LUE Body System: Neuro Brunstrum levels for arm and hand: Arm;Hand Brunstrum level for arm: Stage III Synergy is performed voluntarily Brunstrum level for hand: Stage IV Movements deviating from synergies  Care Tool Care Tool Self Care Eating   Eating Assist Level: Minimal Assistance - Patient >  75%    Oral Care    Oral Care Assist Level: Minimal Assistance - Patient > 75%    Bathing   Body parts bathed by patient: Left arm;Chest;Abdomen;Right upper leg;Left upper leg;Face Body parts bathed by helper: Buttocks;Front perineal area;Left lower leg;Right lower leg;Right arm   Assist Level: Maximal Assistance - Patient 24 - 49%    Upper Body Dressing(including orthotics)   What is the patient wearing?: Pull over shirt   Assist Level: Moderate Assistance - Patient 50 - 74%    Lower Body Dressing (excluding footwear)   What is the patient wearing?: Pants;Incontinence brief Assist for lower body dressing: Total Assistance - Patient < 25%    Putting on/Taking off footwear   What is the patient wearing?: Socks Assist for footwear: Total Assistance - Patient < 25%       Care Tool Toileting Toileting activity   Assist for toileting: Maximal Assistance - Patient 25 - 49%     Care Tool Bed Mobility Roll left and right activity   Roll left and right assist level: Maximal Assistance - Patient 25 - 49%    Sit to lying activity   Sit to lying assist level: Maximal Assistance - Patient 25 - 49%    Lying to sitting on side of bed activity   Lying to sitting on side of bed assist level: the ability to move from lying on the back to sitting on the side of the bed with no back support.: Maximal Assistance - Patient 25 - 49%     Care Tool Transfers Sit to stand transfer   Sit to stand assist level: Maximal Assistance - Patient 25 - 49%    Chair/bed transfer   Chair/bed transfer assist level: Maximal Assistance - Patient 25 - 49%     Toilet transfer   Assist Level: Maximal Assistance - Patient 24 - 49%     Care Tool Cognition  Expression of Ideas and Wants Expression of Ideas and Wants: 3. Some difficulty - exhibits some difficulty with expressing needs and ideas (e.g, some words or finishing thoughts) or speech is not clear  Understanding Verbal and Non-Verbal Content  Understanding Verbal and Non-Verbal Content: 3. Usually understands - understands most conversations, but misses some part/intent of message. Requires cues at times to understand   Memory/Recall Ability Memory/Recall Ability : Current season;That he or she is in a hospital/hospital unit   Refer to Care Plan for Long Term Goals  SHORT TERM GOAL WEEK 1 OT Short Term Goal 1 (Week 1): Patient will complete toilet trasnfer with mod assist OT Short Term Goal 2 (Week 1): Ptient will dress lower body with mod assist OT Short Term Goal 3 (Week 1): Patient will don pull over  shirt with min assist OT Short Term Goal 4 (Week 1): Patient will transition to standing with min assist with use of grab bar or UE support OT Short Term Goal 5 (Week 1): Patient will utilize LUE to aide with bathing and dressing with min cueing  Recommendations for other services: Neuropsych   Skilled Therapeutic Intervention Am session:  Patient received seated in wheelchair.  Incontinent with urinary urgency.  Assisted to change brief and put on clothing.  Patient requiring max assist to transition to standing and unabl eto move feet once in standing.  "My left leg just won't go."  Assisted back to bed as patient with report of dizziness.  Bed alarm set and call bell / personal items in reach. BP:   126/70 Pulse 87  PM Session:  Completed eval testing. Returned to room and completed shower with levels as indicated below.  Imporved sit to stand with use of solid UE support.  Patient returned to bed at end of session.  Bed alarm set and call bell / personal items in reach.   ADL ADL Eating: Minimal assistance Where Assessed-Eating: Chair Grooming: Minimal assistance Where Assessed-Grooming: Sitting at sink Upper Body Bathing: Minimal assistance Where Assessed-Upper Body Bathing: Chair Lower Body Bathing: Maximal assistance Where Assessed-Lower Body Bathing: Chair Upper Body Dressing: Moderate assistance Where  Assessed-Upper Body Dressing: Chair Lower Body Dressing: Maximal assistance Where Assessed-Lower Body Dressing: Chair Toileting: Maximal assistance Where Assessed-Toileting: Other (Comment) Toilet Transfer: Maximal assistance Toilet Transfer Method: Squat pivot Toilet Transfer Equipment: Bedside commode Tub/Shower Transfer: Unable to assess Tub/Shower Transfer Method: Unable to assess Film/video editor: Maximal assistance Film/video editor Method: Warden/ranger: Transfer tub bench ADL Comments: difficult time accepting weight on LLE Mobility  Bed Mobility Bed Mobility: Rolling Right;Rolling Left;Supine to Sit;Sit to Supine Rolling Right: Maximal Assistance - Patient 25-49% Rolling Left: Maximal Assistance - Patient 25-49% Supine to Sit: Maximal Assistance - Patient - Patient 25-49% Sit to Supine: Maximal Assistance - Patient 25-49% Transfers Sit to Stand: Maximal Assistance - Patient 25-49% Stand to Sit: Maximal Assistance - Patient 25-49%   Discharge Criteria: Patient will be discharged from OT if patient refuses treatment 3 consecutive times without medical reason, if treatment goals not met, if there is a change in medical status, if patient makes no progress towards goals or if patient is discharged from hospital.  The above assessment, treatment plan, treatment alternatives and goals were discussed and mutually agreed upon: by patient  Collier Salina 02/21/2023, 4:03 PM

## 2023-02-21 NOTE — Progress Notes (Signed)
Per DM coordinator RN: Orders placed to:  "Discontinuing Levemir 30 units daily and change bedtime Levemir to 25 units QHS. Also recommend to discontinue Novolog 5 units TID with meals for now and can add back if post prandial glucose is elevated."

## 2023-02-21 NOTE — Progress Notes (Signed)
Inpatient Rehabilitation  Patient information reviewed and entered into eRehab system by Jaziyah Gradel M. Suriya Kovarik, M.A., CCC/SLP, PPS Coordinator.  Information including medical coding, functional ability and quality indicators will be reviewed and updated through discharge.    

## 2023-02-21 NOTE — Progress Notes (Signed)
Cbg 418 at hs. Notified Pam Love (PA). New orders received per [facility]. CBG to be rechecked at 10:30pm Dominica Severin RN

## 2023-02-22 LAB — GLUCOSE, CAPILLARY
Glucose-Capillary: 122 mg/dL — ABNORMAL HIGH (ref 70–99)
Glucose-Capillary: 218 mg/dL — ABNORMAL HIGH (ref 70–99)
Glucose-Capillary: 255 mg/dL — ABNORMAL HIGH (ref 70–99)
Glucose-Capillary: 329 mg/dL — ABNORMAL HIGH (ref 70–99)

## 2023-02-22 LAB — HEPATIC FUNCTION PANEL
ALT: 38 U/L (ref 0–44)
AST: 29 U/L (ref 15–41)
Albumin: 2.9 g/dL — ABNORMAL LOW (ref 3.5–5.0)
Alkaline Phosphatase: 109 U/L (ref 38–126)
Bilirubin, Direct: 0.2 mg/dL (ref 0.0–0.2)
Indirect Bilirubin: 0.7 mg/dL (ref 0.3–0.9)
Total Bilirubin: 0.9 mg/dL (ref 0.3–1.2)
Total Protein: 7.4 g/dL (ref 6.5–8.1)

## 2023-02-22 LAB — BASIC METABOLIC PANEL
Anion gap: 9 (ref 5–15)
BUN: 22 mg/dL (ref 8–23)
CO2: 27 mmol/L (ref 22–32)
Calcium: 8.9 mg/dL (ref 8.9–10.3)
Chloride: 96 mmol/L — ABNORMAL LOW (ref 98–111)
Creatinine, Ser: 0.8 mg/dL (ref 0.61–1.24)
GFR, Estimated: >60 mL/min (ref >60)
Glucose, Bld: 123 mg/dL — ABNORMAL HIGH (ref 70–99)
Potassium: 4.1 mmol/L (ref 3.5–5.1)
Sodium: 132 mmol/L — ABNORMAL LOW (ref 135–145)

## 2023-02-22 LAB — OSMOLALITY: Osmolality: 287 mOsm/kg (ref 275–295)

## 2023-02-22 LAB — OSMOLALITY, URINE: Osmolality, Ur: 410 mOsm/kg (ref 300–900)

## 2023-02-22 LAB — CORTISOL-AM, BLOOD: Cortisol - AM: 12.3 ug/dL (ref 6.7–22.6)

## 2023-02-22 MED ORDER — INSULIN DETEMIR 100 UNIT/ML ~~LOC~~ SOLN
26.0000 [IU] | Freq: Every day | SUBCUTANEOUS | Status: DC
  Administered 2023-02-22: 26 [IU] via SUBCUTANEOUS
  Filled 2023-02-22 (×2): qty 0.26

## 2023-02-22 MED ORDER — INSULIN ASPART 100 UNIT/ML IJ SOLN
3.0000 [IU] | Freq: Three times a day (TID) | INTRAMUSCULAR | Status: DC
  Administered 2023-02-22 – 2023-02-23 (×3): 3 [IU] via SUBCUTANEOUS

## 2023-02-22 MED ORDER — OXYCODONE HCL 5 MG PO TABS: 5.0000 mg | ORAL_TABLET | Freq: Three times a day (TID) | ORAL | Status: DC | PRN

## 2023-02-22 NOTE — Progress Notes (Signed)
Patient ID: [name], male   DOB: [birth year], 72 y.o.   MRN: [id number]  Left another message for his daughter she also left a message will reach her eventually.

## 2023-02-22 NOTE — Inpatient Diabetes Management (Signed)
Inpatient Diabetes Program Recommendations  AACE/ADA: New Consensus Statement on Inpatient Glycemic Control (2015)  Target Ranges:  Prepandial:   less than 140 mg/dL      Peak postprandial:   less than 180 mg/dL (1-2 hours)      Critically ill patients:  140 - 180 mg/dL   Lab Results  Component Value Date   GLUCAP 218 (H) 02/22/2023   HGBA1C 13.1 (H) 02/17/2023    Review of Glycemic Control  Latest Reference Range & Units 02/21/23 20:36 02/21/23 22:31 02/22/23 05:58 02/22/23 11:22  Glucose-Capillary 70 - 99 mg/dL 161 (H) 096 (H) 045 (H) 218 (H)  (H): Data is abnormally high Diabetes history: Type 2 DM Outpatient Diabetes medications: Levemir 30 units QA, 20 units QPM, Metformin 1000 mg BID Current orders for Inpatient glycemic control: Levemir 26 units QHS, Novolog 0-9 units TID & HS  Inpatient Diabetes Program Recommendations:    Consider adding Novolog 3 units TID (Assuming patient is consuming >50% of meals).   Thanks, Lujean Rave, MSN, RNC-OB Diabetes Coordinator [phone] (8a-5p)

## 2023-02-22 NOTE — Progress Notes (Signed)
Physical Therapy Session Note  Patient Details  Name: [name] MRN: [id number] Date of Birth: October 27, 1950  Today's Date: 02/22/2023 PT Individual Time: 1331-1425 PT Individual Time Calculation (min): 54 min   Short Term Goals: Week 1:  PT Short Term Goal 1 (Week 1): Pt will complete bed mobility with modA PT Short Term Goal 2 (Week 1): Pt will complete bed<>chair transfers with modA and LRAD PT Short Term Goal 3 (Week 1): Pt will ambulate 38ft with modA and LRAD PT Short Term Goal 4 (Week 1): Pt will initiate stair training  Skilled Therapeutic Interventions/Progress Updates:      Pt in bed watching TV - continues to report fatigue. Denies resting pain. Encouraged him to toilet prior to leaving his room to avoid any incontinent episodes during session. Used Stedy to transfer to toilet in his room. Pt unable to void but had wet brief - changed with clean brief with totalA.   Functional mobility completed as outlined below. Pt continues to present with L sided weakness, antalgic gait, and poor activity tolerance. Able to progress to ambulation with EVA walker today. Used blue TB wrap around LLE to promote hip/knee flexion in swing to improve clearance and step length. LLE stays flexed in stance with limited L heel strike on initial contact and loading phase.   Pt ended session in bed with alarm on and all needs met.    Therapy Documentation Precautions:  Precautions Precautions: Fall Precaution Comments: L hip fx managed non-op, L hemi Restrictions Weight Bearing Restrictions: No LLE Weight Bearing: Weight bearing as tolerated  Mobility: Bed Mobility Supine to Sit: Maximal Assistance - Patient - Patient 25-49% Sit to Supine: Moderate Assistance - Patient 50-74% Transfers Transfers: Sit to Stand;Stand to Sit;Transfer via Lift Equipment Sit to Stand: Moderate Assistance - Patient 50-74% Stand to Sit: Maximal Assistance - Patient 25-49% Transfer via Lift Equipment:  Stedy Locomotion : Gait Ambulation: Yes Gait Assistance: Moderate Assistance - Patient 50-74% Gait Distance (Feet): 168 Feet (45ft + 72ft + 20ft + 43ft with seated rest breaks) Assistive device: Technical brewer Assistance Details: Tactile cues for initiation;Tactile cues for placement;Tactile cues for sequencing;Tactile cues for weight shifting;Tactile cues for posture;Verbal cues for technique;Verbal cues for precautions/safety;Verbal cues for sequencing;Verbal cues for gait pattern;Visual cues/gestures for sequencing;Manual facilitation for weight shifting;Verbal cues for safe use of DME/AE Gait Assistance Details: hemi gait training - minA for advancing LLE during swing phase and soft blocking to prevent buckling. Step-to pattern working on reciprocal stepping. Gait Gait: Yes Gait Pattern: Impaired Gait Pattern: Step-to pattern;Decreased stance time - left;Decreased step length - left;Decreased step length - right;Decreased hip/knee flexion - left;Narrow base of support;Trunk flexed;Lateral trunk lean to left;Poor foot clearance - left;Left flexed knee in stance;Antalgic;Decreased weight shift to left;Decreased dorsiflexion - left Stairs / Additional Locomotion Stairs: No Wheelchair Mobility Wheelchair Mobility: No    Therapy/Group: Individual Therapy  Shanise Balch P Bayley Hurn  PT, DPT, CSRS  02/22/2023, 2:22 PM

## 2023-02-22 NOTE — Progress Notes (Signed)
PROGRESS NOTE   Subjective/Complaints: Still dizzy with therapy but BP is stable Continues to sleep poorly, he says due to interruptions, but he does not want sign on his door  ROS: +insomnia, +lightheadedness   Objective:   No results found. Recent Labs    02/21/23 0619  WBC 7.8  HGB 14.1  HCT 41.2  PLT 309   Recent Labs    02/21/23 0619 02/22/23 0635  NA 129* 132*  K 4.2 4.1  CL 93* 96*  CO2 28 27  GLUCOSE 219* 123*  BUN 21 22  CREATININE 0.86 0.80  CALCIUM 8.6* 8.9    Intake/Output Summary (Last 24 hours) at 02/22/2023 1116 Last data filed at 02/22/2023 1000 Gross per 24 hour  Intake 580 ml  Output 1850 ml  Net -1270 ml        Physical Exam: Vital Signs Blood pressure (!) 148/87, pulse 84, temperature 97.7 F (36.5 C), resp. rate 18, height 5\' 4"  (1.626 m), weight 64.4 kg, SpO2 94 %. Gen: no distress, normal appearing, BMI 24.37 HEENT: oral mucosa pink and moist, NCAT Cardiovascular:     Rate and Rhythm: Normal rate and regular rhythm.     Heart sounds: Normal heart sounds. No murmur heard.    No gallop.     Comments: Sternal noncardiac TTP- some associated bruising- no obvious abrasions Pulmonary:     Effort: Pulmonary effort is normal. No respiratory distress.     Breath sounds: Normal breath sounds. No wheezing or rales.  Abdominal:     General: Bowel sounds are normal. There is no distension.     Palpations: Abdomen is soft.     Tenderness: There is no abdominal tenderness.     Comments: normoactive  Musculoskeletal:     Cervical back: Neck supple. No tenderness.     Comments: RUE 5-/5 in biceps, triceps, WE, grip and FA LUE- 4/5 in same muscles- FA 4-/5 RLE- 5-/5 in HF, KE, DF and PF LLE- HF 1 to 2-/5; DF 3-/5 and PF 3-/5- said too painful to do knee movement  Skin:    General: Skin is warm and dry.     Comments: Peeling on face and head with cradle cap/psoriasis? On head Balding IV  L forearm looks OK R AC fossa bruise from prior IV/IV stick Bruise on sternum from fall  Neurological:     Mental Status: He is alert.     Comments: Decreased to light touch on L side LUE/LLE Slightly delayed responses Ox3 with cues  Psychiatric:     Comments: Flat, focused on food    Assessment/Plan: 1. Functional deficits which require 3+ hours per day of interdisciplinary therapy in a comprehensive inpatient rehab setting. Physiatrist is providing close team supervision and 24 hour management of active medical problems listed below. Physiatrist and rehab team continue to assess barriers to discharge/monitor patient progress toward functional and medical goals  Care Tool:  Bathing    Body parts bathed by patient: Left arm, Chest, Abdomen, Right upper leg, Left upper leg, Face   Body parts bathed by helper: Buttocks, Front perineal area, Left lower leg, Right lower leg, Right arm     Bathing  assist Assist Level: Maximal Assistance - Patient 24 - 49%     Upper Body Dressing/Undressing Upper body dressing   What is the patient wearing?: Pull over shirt    Upper body assist Assist Level: Moderate Assistance - Patient 50 - 74%    Lower Body Dressing/Undressing Lower body dressing      What is the patient wearing?: Pants, Incontinence brief     Lower body assist Assist for lower body dressing: Total Assistance - Patient < 25%     Toileting Toileting    Toileting assist Assist for toileting: Maximal Assistance - Patient 25 - 49%     Transfers Chair/bed transfer  Transfers assist     Chair/bed transfer assist level: Maximal Assistance - Patient 25 - 49%     Locomotion Ambulation   Ambulation assist      Assist level: Moderate Assistance - Patient 50 - 74% Assistive device: Parallel bars Max distance: 7ft   Walk 10 feet activity   Assist  Walk 10 feet activity did not occur: Safety/medical concerns (fatigue and L hip pain)        Walk 50 feet  activity   Assist Walk 50 feet with 2 turns activity did not occur: Safety/medical concerns         Walk 150 feet activity   Assist Walk 150 feet activity did not occur: Safety/medical concerns         Walk 10 feet on uneven surface  activity   Assist Walk 10 feet on uneven surfaces activity did not occur: Safety/medical concerns         Wheelchair     Assist Is the patient using a wheelchair?: Yes Type of Wheelchair: Manual    Wheelchair assist level: Dependent - Patient 0% (L hemi)      Wheelchair 50 feet with 2 turns activity    Assist        Assist Level: Dependent - Patient 0%   Wheelchair 150 feet activity     Assist      Assist Level: Dependent - Patient 0%   Blood pressure (!) 148/87, pulse 84, temperature 97.7 F (36.5 C), resp. rate 18, height 5\' 4"  (1.626 m), weight 64.4 kg, SpO2 94 %.    Medical Problem List and Plan: 1. 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  Antithrombotics: -DVT/anticoagulation:  Pharmaceutical: Lovenox             -antiplatelet therapy: Aspirin and Plavix for three weeks followed by Plavix alone   3. Pain Management: Tylenol, oxycodone as needed- pt doesn't want "pain meds' but willing to try Tylenol- pain 8-9/10- but explained cannot do BC powders   4. Mood/Behavior/Sleep: LCSW to evaluate and provide emotional support             -antipsychotic agents: n/a   5. Neuropsych/cognition: This patient is capable of making decisions on his own behalf.   6. Skin/Wound Care: Routine skin care checks   7. Fluids/Electrolytes/Nutrition: Routine Is and Os and follow-up chemistries   8: Hypertension: monitor TID and prn (home: Hyzaar 100/25 daily, verapamil 240 mg daily). Add magnesium gluconate 250mg  HS   9: Hyperlipidemia: continue statin   10:  Peripheral neuropathy: continue gabapentin 600 mg q HS,  discuss Qutenza   11:Left greater trochanter fracture: non-operative             -WBAT with a walker             -follow-up with Dr. Joice Lofts  -kpad ordered   12: DM-2: CBGs QID, carb modified diet; home: (Levemir 50 units BID; Victoza 1.8 mg daily, metformin 1000 mg BID) A1c 13.1%             -continue SSI             -continue Novolog 5 units with meals             -continue Levemir 30 units in AM, increase Levemir to 26 units at bedtime             -suggest bringing Victoza in from home.   -provided list of foods for diabetics  -placed order for no drinks but water, coffee, and tea  13: Hyponatremia:monitor weekly.    14: Elevated bilirubin: follow-up with PCP outpatient.    15: Vitamin D deficiency: continue weekly supplementation for 8 weeks  16. Lightheadedness: decrease oxycodone to 5mg  q8H prn    LOS: 2 days A FACE TO FACE EVALUATION WAS PERFORMED  Clint Bolder P Odalys Win 02/22/2023, 11:16 AM

## 2023-02-22 NOTE — Progress Notes (Signed)
Occupational Therapy Session Note  Patient Details  Name: [name] MRN: [id number] Date of Birth: Jun 14, 1951  Today's Date: 02/22/2023 OT Individual Time: 4098-1191 OT Individual Time Calculation (min): 73 min    Short Term Goals: Week 1:  OT Short Term Goal 1 (Week 1): Patient will complete toilet trasnfer with mod assist OT Short Term Goal 2 (Week 1): Ptient will dress lower body with mod assist OT Short Term Goal 3 (Week 1): Patient will don pull over shirt with min assist OT Short Term Goal 4 (Week 1): Patient will transition to standing with min assist with use of grab bar or UE support OT Short Term Goal 5 (Week 1): Patient will utilize LUE to aide with bathing and dressing with min cueing  Skilled Therapeutic Interventions/Progress Updates:    Pt sleeping upon arrival. Min multimodal cues to arouse. Initial focus on bed mobility, squat pivot transfer, dressing with sit<>stand, and LUE functional use for BADLs. Supine>sit EOB with max A. Sitting balance with CGA. Squat pivot transfer to w/c with mod A. Pt demonstrates decreased push through LLE during transfer. Pt completed grooming tasks and washing face seated in w/c at sink. Pt using LUE to assist with wringing out wash cloth. Pt declined changing clothing or bathing rest of body. Pt used reacher to assist with threading LLE into pants. LB dressing with tot A. Sit<>stand with mod A using Stedy. Pt reports lightheadedness-BP 102/74. Pt transitioned to gym for LUE functional use at table. Pt issued tan theraputty and educated on use for function and strengthening. Pt c/o lightheadedness again-BP 111/78. Pt returned to room and encouraged to remain in w/c. Pt insistent on returning to bed. Stedy used for transfer to bed. Max A for sit>supine. Pt remained in bed with all needs within reach. Bed alarm activated.   Therapy Documentation Precautions:  Precautions Precautions: Fall Precaution Comments: L hip fx managed non-op, L  hemi Restrictions Weight Bearing Restrictions: Yes LLE Weight Bearing: Weight bearing as tolerated   Pain: Pt reports Lt hip pain with activity; repositioned   Therapy/Group: Individual Therapy  Rich Brave 02/22/2023, 9:31 AM

## 2023-02-22 NOTE — Progress Notes (Signed)
Physical Therapy Session Note  Patient Details  Name: [name] MRN: [id number] Date of Birth: Oct 29, 1950  Today's Date: 02/22/2023 PT Individual Time: 1000-1055 PT Individual Time Calculation (min): 55 min   Short Term Goals: Week 1:  PT Short Term Goal 1 (Week 1): Pt will complete bed mobility with modA PT Short Term Goal 2 (Week 1): Pt will complete bed<>chair transfers with modA and LRAD PT Short Term Goal 3 (Week 1): Pt will ambulate 70ft with modA and LRAD PT Short Term Goal 4 (Week 1): Pt will initiate stair training  Skilled Therapeutic Interventions/Progress Updates:      Pt supine in bed sleeping - awakens to voice and agreeable to therapy session. No reports of pain but reports feeling fatigued. Functional mobility completed as outlined below. Several seated rest breaks required throughout treatment for recovery and fatigue. Asked patient to toilet prior to leaving his room and he declined. Once in therapy gym and completing sit<>stands at the // bars with maxA, patient reporting need to toilet. Patient continent of bladder and bowel during session, using Corene Cornea to safely transfer on/off the commode over the toilet. Assist required for posterior pericare in standing using the Stedy. New clean brief applied for cleanliness and skin integrity. Charted in flowsheets. Session ended supine in bed with alarm on. Pt requesting pain Rx for his L hip - nursing made aware.   Therapy Documentation Precautions:  Precautions Precautions: Fall Precaution Comments: L hip fx managed non-op, L hemi Restrictions Weight Bearing Restrictions: No LLE Weight Bearing: Weight bearing as tolerated Mobility: Bed Mobility Bed Mobility: Supine to Sit;Sit to Supine Supine to Sit: Maximal Assistance - Patient - Patient 25-49% (assist for BLE and trunk management. L hip pain with transitions) Sit to Supine: Moderate Assistance - Patient 50-74% Transfers Transfers: Sit to Stand;Stand to  Sit;Squat Pivot Transfers Sit to Stand: Moderate Assistance - Patient 50-74% Stand to Sit: Maximal Assistance - Patient 25-49% Squat Pivot Transfers: Maximal Assistance - Patient 25-49% Transfer (Assistive device): None Locomotion : Gait Ambulation: Yes Gait Assistance: Minimal Assistance - Patient > 75%;2 Helpers (+2 for w/c follow for safety) Gait Distance (Feet): 35 Feet + 35 Feet (seated rest break) Assistive device: Other (Comment) (R hand rail in hallway) Gait Assistance Details: Tactile cues for initiation;Tactile cues for placement;Tactile cues for sequencing;Tactile cues for weight shifting;Tactile cues for posture;Verbal cues for technique;Verbal cues for precautions/safety;Verbal cues for sequencing;Verbal cues for gait pattern;Visual cues/gestures for sequencing;Manual facilitation for weight shifting Gait Assistance Details: hemi gait training - minA for advancing LLE during swing phase and soft blocking to prevent buckling. Step-to pattern working on reciprocal stepping. Gait Gait: Yes Gait Pattern: Impaired Gait Pattern: Step-to pattern;Decreased stance time - left;Decreased step length - left;Decreased step length - right;Decreased hip/knee flexion - left;Narrow base of support;Trunk flexed;Lateral trunk lean to left;Poor foot clearance - left;Left flexed knee in stance Stairs / Additional Locomotion Stairs: No Wheelchair Mobility Wheelchair Mobility: No     Therapy/Group: Individual Therapy  Elani Delph P Avalene Sealy  PT, DPT, CSRS  02/22/2023, 10:52 AM

## 2023-02-23 LAB — GLUCOSE, CAPILLARY
Glucose-Capillary: 172 mg/dL — ABNORMAL HIGH (ref 70–99)
Glucose-Capillary: 222 mg/dL — ABNORMAL HIGH (ref 70–99)
Glucose-Capillary: 330 mg/dL — ABNORMAL HIGH (ref 70–99)
Glucose-Capillary: 312 mg/dL — ABNORMAL HIGH (ref 70–99)

## 2023-02-23 MED ORDER — INSULIN DETEMIR 100 UNIT/ML ~~LOC~~ SOLN
27.0000 [IU] | Freq: Every day | SUBCUTANEOUS | Status: DC
  Administered 2023-02-23: 27 [IU] via SUBCUTANEOUS
  Filled 2023-02-23 (×2): qty 0.27

## 2023-02-23 MED ORDER — INSULIN ASPART 100 UNIT/ML IJ SOLN
6.0000 [IU] | Freq: Three times a day (TID) | INTRAMUSCULAR | Status: DC
  Administered 2023-02-23 – 2023-03-07 (×36): 6 [IU] via SUBCUTANEOUS

## 2023-02-23 MED ORDER — OXYCODONE HCL 5 MG PO TABS
2.5000 mg | ORAL_TABLET | Freq: Three times a day (TID) | ORAL | Status: DC | PRN
  Administered 2023-02-23 – 2023-02-25 (×3): 2.5 mg via ORAL
  Filled 2023-02-23 (×3): qty 1

## 2023-02-23 NOTE — Progress Notes (Signed)
Occupational Therapy Session Note  Patient Details  Name: [name] MRN: [id number] Date of Birth: [birth year]  Today's Date: 02/23/2023 OT Individual Time: 0930-1040 OT Individual Time Calculation (min): 70 min    Short Term Goals: Week 1:  OT Short Term Goal 1 (Week 1): Patient will complete toilet trasnfer with mod assist OT Short Term Goal 2 (Week 1): Ptient will dress lower body with mod assist OT Short Term Goal 3 (Week 1): Patient will don pull over shirt with min assist OT Short Term Goal 4 (Week 1): Patient will transition to standing with min assist with use of grab bar or UE support OT Short Term Goal 5 (Week 1): Patient will utilize LUE to aide with bathing and dressing with min cueing  Skilled Therapeutic Interventions/Progress Updates:    Pt resting in w/c upon arrival. Pt declined bathing/dressing this morning. OT intervention with focus on LUE function to increase pt's independence with BADLs. Pt with improved AROM and function this morning. Initial focus on reaching tasks to retrieve and replace cards on card board. Pt able to complete task without assistance. Pt engaged in card game of poker. Pt counted cards before start of game, manipulating careds in BUE appropriately. Pt able to gather cards from table using BUE and return to card box. Pt with increased fatigue througout session but cooperative with all tasks.  9 Hole Peg Test is used to measure finger dexterity in pts with various neurological diagnoses. - Instructions The pt was instructed to pick up the pegs one at a time, using their dominant hand first and put them into the holes in any order until the holes were all filled. The pt then removed the pegs one at a time and returned them to the container. Both hands were tested separately.  - Results The pt completed the test in 41.3 seconds with RUE and 1:37.1 seconds in LUE  Scores are based on the time taken to complete the activity. The timer started the  moment the pt touched the first peg until the moment the last peg hit the container.  - Norms for healthy males ages 54-70+ 43-55 R 18.9 L 19.84 56-60 R 20.90 L 21.64 61-65 R 20.87 L 21.60 66-70 R 21.23 L 22.29 71+ R 25.79 L 25.95  The Dynamometer Grip Strength Test is a quantitative and objective measure of isometric muscular strength of the hand and forearm.  -Instructions The patient was asked to sit with their back, pelvis, and knees at 90 degrees. The shoulder was adducted and neutrally rotated with The elbow flexed to 90 degrees and forearm in neutral. The arm was not supported.  -Results The score was determined by calculating the average of 3 trials. The pt's average score was 47 lbs in the R hand and 22 lbs in the L hand.  -Norms Males Average in lbs 55-59 R 101.1 L 83.2 60-64 R 89.7 L 76.8 65-69 R 91.1 L 76.8 70-74 R 75.3 L 64.8 75+ R 65.7 L 55.0  Pt returned to room. Squat pivot transfer to EOB with mod A. Min A to repostion in bed. Pt remained in bed with all needs within reach. Bed alarm activated.      Therapy Documentation Precautions:  Precautions Precautions: Fall Precaution Comments: L hip fx managed non-op, L hemi Restrictions Weight Bearing Restrictions: Yes LLE Weight Bearing: Weight bearing as tolerated   Pain: Pt c/o Lt hip pain (unrated) at rest; repositioned, RN aware   Therapy/Group: Individual Therapy  Lavone Neri [facility] 02/23/2023, 10:41 AM

## 2023-02-23 NOTE — Progress Notes (Signed)
PROGRESS NOTE   Subjective/Complaints: No new complaints this morning Still sleeping poorly he says due to interruptions at night but he does not want a sign on his door to minimize interruptions  ROS: +insomnia, +lightheadedness, +headache   Objective:   No results found. Recent Labs    02/21/23 0619  WBC 7.8  HGB 14.1  HCT 41.2  PLT 309   Recent Labs    02/21/23 0619 02/22/23 0635  NA 129* 132*  K 4.2 4.1  CL 93* 96*  CO2 28 27  GLUCOSE 219* 123*  BUN 21 22  CREATININE 0.86 0.80  CALCIUM 8.6* 8.9    Intake/Output Summary (Last 24 hours) at 02/23/2023 0930 Last data filed at 02/23/2023 9381 Gross per 24 hour  Intake 680 ml  Output 1425 ml  Net -745 ml        Physical Exam: Vital Signs Blood pressure 132/84, pulse 84, temperature 98.5 F (36.9 C), temperature source Oral, resp. rate 18, height 5\' 4"  (1.626 m), weight 64.4 kg, SpO2 96 %. Gen: no distress, normal appearing, BMI 24.37 HEENT: oral mucosa pink and moist, NCAT, rosacea on face Cardiovascular:     Rate and Rhythm: Normal rate and regular rhythm.     Heart sounds: Normal heart sounds. No murmur heard.    No gallop.     Comments: Sternal noncardiac TTP- some associated bruising- no obvious abrasions Pulmonary:     Effort: Pulmonary effort is normal. No respiratory distress.     Breath sounds: Normal breath sounds. No wheezing or rales.  Abdominal:     General: Bowel sounds are normal. There is no distension.     Palpations: Abdomen is soft.     Tenderness: There is no abdominal tenderness.     Comments: normoactive  Musculoskeletal:     Cervical back: Neck supple. No tenderness.     Comments: RUE 5-/5 in biceps, triceps, WE, grip and FA LUE- 4/5 in same muscles- FA 4-/5 RLE- 5-/5 in HF, KE, DF and PF LLE- HF 1 to 2-/5; DF 3-/5 and PF 3-/5- said too painful to do knee movement  Skin:    General: Skin is warm and dry.     Comments:  Peeling on face and head with cradle cap/psoriasis? On head Balding IV L forearm looks OK R AC fossa bruise from prior IV/IV stick Bruise on sternum from fall  Neurological:     Mental Status: He is alert.     Comments: Decreased to light touch on L side LUE/LLE Slightly delayed responses Ox3 with cues  Psychiatric:     Comments: Flat, focused on food    Assessment/Plan: 1. Functional deficits which require 3+ hours per day of interdisciplinary therapy in a comprehensive inpatient rehab setting. Physiatrist is providing close team supervision and 24 hour management of active medical problems listed below. Physiatrist and rehab team continue to assess barriers to discharge/monitor patient progress toward functional and medical goals  Care Tool:  Bathing    Body parts bathed by patient: Left arm, Chest, Abdomen, Right upper leg, Left upper leg, Face   Body parts bathed by helper: Buttocks, Front perineal area, Left lower leg, Right lower  leg, Right arm     Bathing assist Assist Level: Maximal Assistance - Patient 24 - 49%     Upper Body Dressing/Undressing Upper body dressing   What is the patient wearing?: Pull over shirt    Upper body assist Assist Level: Moderate Assistance - Patient 50 - 74%    Lower Body Dressing/Undressing Lower body dressing      What is the patient wearing?: Pants, Incontinence brief     Lower body assist Assist for lower body dressing: Total Assistance - Patient < 25%     Toileting Toileting    Toileting assist Assist for toileting: Maximal Assistance - Patient 25 - 49%     Transfers Chair/bed transfer  Transfers assist     Chair/bed transfer assist level: Maximal Assistance - Patient 25 - 49%     Locomotion Ambulation   Ambulation assist      Assist level: Moderate Assistance - Patient 50 - 74% Assistive device: Fara Boros Max distance: 36ft   Walk 10 feet activity   Assist  Walk 10 feet activity did not occur:  Safety/medical concerns (fatigue and L hip pain)  Assist level: Moderate Assistance - Patient - 50 - 74% Assistive device: Walker-Eva   Walk 50 feet activity   Assist Walk 50 feet with 2 turns activity did not occur: Safety/medical concerns  Assist level: Moderate Assistance - Patient - 50 - 74% Assistive device: Walker-Eva    Walk 150 feet activity   Assist Walk 150 feet activity did not occur: Safety/medical concerns         Walk 10 feet on uneven surface  activity   Assist Walk 10 feet on uneven surfaces activity did not occur: Safety/medical concerns         Wheelchair     Assist Is the patient using a wheelchair?: Yes Type of Wheelchair: Manual    Wheelchair assist level: Dependent - Patient 0% (L hemi)      Wheelchair 50 feet with 2 turns activity    Assist        Assist Level: Dependent - Patient 0%   Wheelchair 150 feet activity     Assist      Assist Level: Dependent - Patient 0%   Blood pressure 132/84, pulse 84, temperature 98.5 F (36.9 C), temperature source Oral, resp. rate 18, height 5\' 4"  (1.626 m), weight 64.4 kg, SpO2 96 %.    Medical Problem List and Plan: 1. 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  Antithrombotics: -DVT/anticoagulation:  Pharmaceutical: Lovenox             -antiplatelet therapy: Aspirin and Plavix for three weeks followed by Plavix alone   3. Pain Management: Tylenol, oxycodone as needed- pt doesn't want "pain meds' but willing to try Tylenol- pain 8-9/10- but explained cannot do BC powders   4. Mood/Behavior/Sleep: LCSW to evaluate and provide emotional support             -antipsychotic agents: n/a   5. Neuropsych/cognition: This patient is capable of making decisions on his own behalf.   6. Skin/Wound Care: Routine skin care checks   7.  Fluids/Electrolytes/Nutrition: Routine Is and Os and follow-up chemistries   8: Hypertension: monitor TID and prn (home: Hyzaar 100/25 daily,  verapamil 240 mg daily). Add magnesium gluconate 250mg  HS   9: Hyperlipidemia: continue statin   10: Peripheral neuropathy: continue gabapentin 600 mg q HS, discuss Qutenza   11:Left greater trochanter fracture: non-operative             -WBAT with a walker             -follow-up with Dr. Joice Lofts  -kpad ordered   12: DM-2: CBGs QID, carb modified diet; home: (Levemir 50 units BID; Victoza 1.8 mg daily, metformin 1000 mg BID) A1c 13.1%             -continue SSI             -increase Novolog to 6 units with meals             -continue Levemir 30 units in AM, increase Levemir to 26 units at bedtime             -suggest bringing Victoza in from home.   -provided list of foods for diabetics  -placed order for no drinks but water, coffee, and tea  13: Hyponatremia:monitor weekly.    14: Elevated bilirubin: follow-up with PCP outpatient.    15: Vitamin D deficiency: continue weekly supplementation for 8 weeks  16. Lightheadedness: decrease oxycodone to 2.5mg  q8H prn  17. Headache: called nursing to bring tylenol for him.    LOS: 3 days A FACE TO FACE EVALUATION WAS PERFORMED  Sariah Henkin P Dorna Mallet 02/23/2023, 9:30 AM

## 2023-02-23 NOTE — Inpatient Diabetes Management (Addendum)
Inpatient Diabetes Program Recommendations  AACE/ADA: New Consensus Statement on Inpatient Glycemic Control   Target Ranges:  Prepandial:   less than 140 mg/dL      Peak postprandial:   less than 180 mg/dL (1-2 hours)      Critically ill patients:  140 - 180 mg/dL    Latest Reference Range & Units 02/22/23 05:58 02/22/23 11:22 02/22/23 16:03 02/22/23 21:35 02/23/23 06:11  Glucose-Capillary 70 - 99 mg/dL 161 (H) 096 (H) 045 (H) 329 (H) 172 (H)   Review of Glycemic Control  Diabetes history: DM2 Outpatient Diabetes medications: Levemir 50 units BID, Victoza 1.8 mg daily, Metformin 1000 mg BID Current orders for Inpatient glycemic control: Levemir 27 units QHS, Novolog 3 units TID with meals, Novolog 0-9 units TID with meals, Novolog 0-5 units QHS  Inpatient Diabetes Program Recommendations:    Insulin: Please consider increasing meal coverage to Novolog 6 units TID with meals.  NOTE: Signing off consult for diabetes coordinator. Please re-consult if needed.  Thanks, Orlando Penner, RN, MSN, CDCES Diabetes Coordinator Inpatient Diabetes Program [phone] (Team Pager from 8am to 5pm)

## 2023-02-23 NOTE — Progress Notes (Signed)
Physical Therapy Session Note  Patient Details  Name: [name] MRN: [id number] Date of Birth: Jun 11, 1951  Today's Date: 02/23/2023 PT Individual Time: 1120-1200 PT Individual Time Calculation (min): 40 min   Short Term Goals: Week 1:  PT Short Term Goal 1 (Week 1): Pt will complete bed mobility with modA PT Short Term Goal 2 (Week 1): Pt will complete bed<>chair transfers with modA and LRAD PT Short Term Goal 3 (Week 1): Pt will ambulate 31ft with modA and LRAD PT Short Term Goal 4 (Week 1): Pt will initiate stair training  Skilled Therapeutic Interventions/Progress Updates:     Pt received supine in bed asleep. Awakens to verbal stimuli and agrees to therapy. Reports pain in L hip. PT provides rest breaks, gentle mobility, and alerts RN to pain who provides pain meds at end of session. Supine to sit with modA management of lower extremities and cues for sequencing and positioning. ModA for stand pivot transfer to [facility] without AD and with cues for initiation, sequencing, and positioning. WC transport to gym. Pt stands with RW and ambulates x40' with RW and minA/modA, with PT providing consistent manual facilitation of progression of L lower extremity, with cues for posture and safe RW management. Pt completes x5:00 active on Nustep for gentle ROM of L lower extremity as well as reciprocal coordination training. Pt completes at work load of 3 with average steps per minute ~20. Following rest break, pt ambulates additional x30' with RW and same assistance and cues. Stand pivot back to bed with modA and cues for positioning. ModA for sit to supine with management of bilateral lower extremities and trunk control. Left supine with alarm intact and all needs within reach.   Therapy Documentation Precautions:  Precautions Precautions: Fall Precaution Comments: L hip fx managed non-op, L hemi Restrictions Weight Bearing Restrictions: Yes LLE Weight Bearing: Weight bearing as  tolerated  Therapy/Group: Individual Therapy  Beau Fanny, PT, DPT 02/23/2023, 5:08 PM

## 2023-02-23 NOTE — IPOC Note (Signed)
Overall Plan of Care [facility]) Patient Details Name: [name] MRN: [id number] DOB: Jul 18, 1951  Admitting Diagnosis: Acute arterial ischemic stroke, vertebrobasilar, brainstem, right [facility])  Hospital Problems: Principal Problem:   Acute arterial ischemic stroke, vertebrobasilar, brainstem, right (HCC) Active Problems:   Diabetic peripheral neuropathy associated with type 2 diabetes mellitus (HCC)   Nondisplaced fracture of greater trochanter of left femur, initial encounter for closed fracture [facility])     Functional Problem List: Nursing Bladder, Safety, Bowel, Endurance, Medication Management, Pain  PT Balance, Endurance, Motor, Pain, Safety, Sensory, Skin Integrity  OT Balance, Behavior, Safety, Cognition, Sensory, Endurance, Motor, Pain  SLP    TR         Basic ADL's: OT Bathing, Dressing, Toileting     Advanced  ADL's: OT       Transfers: PT Bed Mobility, Bed to Chair, Customer service manager, Tub/Shower     Locomotion: PT Ambulation, Stairs, Wheelchair Mobility     Additional Impairments: OT Fuctional Use of Upper Extremity  SLP        TR      Anticipated Outcomes Item Anticipated Outcome  Self Feeding Independent  Swallowing      Basic self-care  Min assist  Toileting  Min assist   Bathroom Transfers Min assist  Bowel/Bladder  manage bowel w mod I and bladder w toileting  Transfers  CGA with LRAD  Locomotion  CGA with LRAD  Communication     Cognition     Pain  < 4 with prns  Safety/Judgment  manage with cues   Therapy Plan: PT Intensity: Minimum of 1-2 x/day ,45 to 90 minutes PT Frequency: 5 out of 7 days PT Duration Estimated Length of Stay: 2 weeks OT Intensity: Minimum of 1-2 x/day, 45 to 90 minutes OT Frequency: 5 out of 7 days OT Duration/Estimated Length of Stay: 14-16 days     Team Interventions: Nursing Interventions Bowel Management, Disease Management/Prevention, Medication Management, Patient/Family Education, Pain Management,  Discharge Planning  PT interventions Ambulation/gait training, Discharge planning, Functional mobility training, Psychosocial support, Therapeutic Activities, Visual/perceptual remediation/compensation, Wheelchair propulsion/positioning, Therapeutic Exercise, Skin care/wound management, Neuromuscular re-education, Disease management/prevention, Warden/ranger, Cognitive remediation/compensation, DME/adaptive equipment instruction, Pain management, Splinting/orthotics, UE/LE Strength taining/ROM, Stair training, Patient/family education, Community reintegration, UE/LE Coordination activities, Functional electrical stimulation  OT Interventions Warden/ranger, Discharge planning, Pain management, Self Care/advanced ADL retraining, Therapeutic Activities, UE/LE Coordination activities, Cognitive remediation/compensation, Disease mangement/prevention, Functional mobility training, Patient/family education, Therapeutic Exercise, DME/adaptive equipment instruction, Neuromuscular re-education, Splinting/orthotics, UE/LE Strength taining/ROM  SLP Interventions    TR Interventions    SW/CM Interventions Discharge Planning, Psychosocial Support, Patient/Family Education   Barriers to Discharge MD  Medical stability  Nursing Decreased caregiver support, Home environment access/layout 1 level 3 ste bil rails; daughter to assist (works 5p-9p)  PT Decreased caregiver support, Curator, Lack of/limited family support, Community education officer for SNF coverage, Incontinence    OT      SLP      SW Insurance for SNF coverage     Team Discharge Planning: Destination: PT-Home ,OT- Home , SLP-  Projected Follow-up: PT-Home health PT, 24 hour supervision/assistance, OT-  Home health OT, SLP-  Projected Equipment Needs: PT-To be determined, OT- To be determined, SLP-  Equipment Details: PT- , OT-  Patient/family involved in discharge planning: PT- Patient,  OT-Patient, SLP-   MD  ELOS: 12-14 days Medical Rehab Prognosis:  Excellent Assessment: The patient has been admitted for CIR therapies with the diagnosis of right  cerebral peduncle stroke. The team will be addressing functional mobility, strength, stamina, balance, safety, adaptive techniques and equipment, self-care, bowel and bladder mgt, patient and caregiver education. Goals have been set at supervision. Anticipated discharge destination is home.        See Team Conference Notes for weekly updates to the plan of care

## 2023-02-23 NOTE — Progress Notes (Signed)
Occupational Therapy Note  Patient Details  Name: [name] MRN: [id number] Date of Birth: 1951/10/06  Today's Date: 02/23/2023 OT Missed Time: 30 Minutes Missed Time Reason: Patient fatigue  Pt sleeping upon arrival. Mod multimodal cues to arouse. Pt commented that he was exhausted. Pt declined 2/2 fatigue. Pt missed 30 mins skilled OT services. Will attempt to see again as schedule allows.   Lavone Neri [facility] 02/23/2023, 2:26 PM

## 2023-02-23 NOTE — Progress Notes (Signed)
Physical Therapy Session Note  Patient Details  Name: [name] MRN: [id number] Date of Birth: [birth year]  Today's Date: 02/23/2023 PT Individual Time: 0800-0857 PT Individual Time Calculation (min): 57 min   Short Term Goals: Week 1:  PT Short Term Goal 1 (Week 1): Pt will complete bed mobility with modA PT Short Term Goal 2 (Week 1): Pt will complete bed<>chair transfers with modA and LRAD PT Short Term Goal 3 (Week 1): Pt will ambulate 78ft with modA and LRAD PT Short Term Goal 4 (Week 1): Pt will initiate stair training  Skilled Therapeutic Interventions/Progress Updates:      Pt supine in bed sleeping on arrival - no reports of pain but continues to report fatigue. Encouragement needed to participate. Noted to be incontinent of urine with wet bed. Initiated functional mobility as outlined below. Used Stedy to assist patient to the bathroom for toileting and brief change - continent of bladder void. At the sink, encouraged self care due to strong body odor - applied deodorant with setupA and used mouth wash for oral care (has no teeth). Focused remainder of session on LLE strengthening, gait progressions, activity tolerance, and functional transfers. Continues to be primarily limited by L sided weakness generalized weakness and deconditioning. Pain inhibiting ambulation, progressing to 9/10 on L hip. Nursing made aware for pain medication and rest breaks provided during session for recovery and pain management.   ABC CONFIDENCE SCALE completed as documented below. Total score = 23%.   Pt ended session seated in w/c with safety belt alarm on, 1/2 lap tray for LUE, call bell within reach.   Therapy Documentation Precautions:  Precautions Precautions: Fall Precaution Comments: L hip fx managed non-op, L hemi Restrictions Weight Bearing Restrictions: Yes LLE Weight Bearing: Weight bearing as tolerated  Mobility: Bed Mobility Bed Mobility: Supine to Sit;Sit to Supine Supine to  Sit: Maximal Assistance - Patient - Patient 25-49% Transfers Transfers: Sit to Stand;Stand to Sit;Transfer via Lift Equipment;Stand Pivot Transfers Sit to Stand: Moderate Assistance - Patient 50-74% Stand to Sit: Moderate Assistance - Patient 50-74% Stand Pivot Transfers: Moderate Assistance - Patient 50 - 74% Stand Pivot Transfer Details: Tactile cues for sequencing;Tactile cues for initiation;Tactile cues for weight shifting;Tactile cues for posture;Verbal cues for safe use of DME/AE;Verbal cues for gait pattern;Verbal cues for precautions/safety;Verbal cues for technique;Verbal cues for sequencing;Manual facilitation for weight shifting;Visual cues for safe use of DME/AE;Tactile cues for weight beaing Transfer (Assistive device): Rolling walker Transfer via Lift Equipment: Stedy Locomotion : Gait Ambulation: Yes Gait Assistance: Moderate Assistance - Patient 50-74% Gait Distance (Feet): 248 Feet (100ft + 28ft + 69ft + 51ft) Assistive device: Technical brewer Assistance Details: Tactile cues for initiation;Tactile cues for placement;Tactile cues for sequencing;Tactile cues for weight shifting;Tactile cues for posture;Verbal cues for technique;Verbal cues for precautions/safety;Verbal cues for sequencing;Verbal cues for gait pattern;Visual cues/gestures for sequencing;Manual facilitation for weight shifting;Verbal cues for safe use of DME/AE Gait Assistance Details: Assist needed for trunk support and ~50% for advancing LLE for adequate step length. Gait Gait: Yes Gait Pattern: Impaired Gait Pattern: Step-to pattern;Decreased stance time - left;Decreased step length - left;Decreased step length - right;Decreased hip/knee flexion - left;Narrow base of support;Trunk flexed;Lateral trunk lean to left;Poor foot clearance - left;Left flexed knee in stance;Antalgic;Decreased weight shift to left;Decreased dorsiflexion - left Stairs / Additional Locomotion Stairs: No Wheelchair Mobility Wheelchair  Mobility: No   The Activities-specific Balance Confidence (ABC) Scale* Instructions to Participants: For each of the following activities, please indicate your  level of confidence in doing the activity without losing your balance or becoming unsteady from choosing one of the percentage points on the scale from 0% to 100% If you do not currently do the activity in question, try and imagine how confident you would be if you had to do the activity. If you normally use a walking aid to do the activity or hold onto someone, rate your confidence as if you were using these supports.    No Confidence 0% 10 20 30  40 50 60 70 80 90 100% Completely Confident   How confident are you that you will not lose your balance or become unsteady when you. 1. .walk around the house? __25___% 2. .walk up or down stairs? ___10__% 3. .bend over and pick up a slipper from the front of a closet floor? __5___% 4. .reach for a small can off a shelf at eye level? __100___% 5. .stand on your tip toes and reach for something above your head? __0___% 6. .stand on a chair and reach for something? ___0__% 7. .sweep the floor? __25___% 8. .walk outside the house to a car parked in the driveway? __25___% 9. .get into or out of a car? ___25__% 10. .walk across a parking lot to the mall? __25___% 11. .walk up or down a ramp? ___25__% 12. .walk in a crowded mall where people rapidly walk past you? __25___% 13. Marland Kitchenare bumped into by people as you walk through the mall? ___50__% 14. .step onto or off of an escalator while you are holding onto a railing? ___25__% 15. .step onto or off an escalator while holding onto parcels such that you cannot hold onto the railing? ___5__% 16. .walk outside on icy sidewalks? ___0__%  [employer] & Izola Price AM. The Activities-specific Balance Confidence (ABC) Scale. Journal of Gerontology  Med Sci [id number]; 50(1):M28-34. Total ABC Score: ____370______ Scoring: _________370____ / 16 =  Total ABC Score:  _______23___% of self confidence   Therapy/Group: Individual Therapy  Teandre Hamre P Jacelynn Hayton  PT, DPT, CSRS  02/23/2023, 8:51 AM

## 2023-02-23 NOTE — Progress Notes (Signed)
Patient ID: Thea Silversmith, male   DOB: [birth year], 72 y.o.   MRN: [id number]  Was finally able to reach pt's daughter-[name] to give team conference update regarding goals of CGA-min assist and discharge 5/16. She is dealing with her Mom-pt's wife also who is in [place] in a rehab and has colon cancer. Will work with on discharge needs and let pt know daughter will be here tomorrow to see him.

## 2023-02-24 DIAGNOSIS — Z794 Long term (current) use of insulin: Secondary | ICD-10-CM

## 2023-02-24 DIAGNOSIS — K5900 Constipation, unspecified: Secondary | ICD-10-CM

## 2023-02-24 DIAGNOSIS — E1165 Type 2 diabetes mellitus with hyperglycemia: Secondary | ICD-10-CM

## 2023-02-24 DIAGNOSIS — E871 Hypo-osmolality and hyponatremia: Secondary | ICD-10-CM

## 2023-02-24 LAB — GLUCOSE, CAPILLARY
Glucose-Capillary: 212 mg/dL — ABNORMAL HIGH (ref 70–99)
Glucose-Capillary: 176 mg/dL — ABNORMAL HIGH (ref 70–99)
Glucose-Capillary: 276 mg/dL — ABNORMAL HIGH (ref 70–99)
Glucose-Capillary: 304 mg/dL — ABNORMAL HIGH (ref 70–99)

## 2023-02-24 MED ORDER — SORBITOL 70 % SOLN
60.0000 mL | Freq: Once | Status: AC
  Administered 2023-02-24: 60 mL via ORAL
  Filled 2023-02-24: qty 60

## 2023-02-24 MED ORDER — INSULIN DETEMIR 100 UNIT/ML ~~LOC~~ SOLN
30.0000 [IU] | Freq: Every day | SUBCUTANEOUS | Status: DC
  Administered 2023-02-24 – 2023-02-25 (×2): 30 [IU] via SUBCUTANEOUS
  Filled 2023-02-24 (×4): qty 0.3

## 2023-02-24 NOTE — Plan of Care (Signed)
  Problem: RH BOWEL ELIMINATION Goal: RH STG MANAGE BOWEL WITH ASSISTANCE Description: STG Manage Bowel with toileting Assistance. Outcome: Not Progressing; LBM 5/2 ; sorbitol ordered   Problem: RH BLADDER ELIMINATION Goal: RH STG MANAGE BLADDER WITH ASSISTANCE Description: STG Manage Bladder With toileting Assistance Outcome: Not Progressing; incontinence at times

## 2023-02-24 NOTE — Discharge Summary (Signed)
Physician Discharge Summary  Patient ID: [name] MRN: [id number] DOB/AGE: 1950/11/12 72 y.o.  Admit date: 02/20/2023 Discharge date: 03/08/2023  Discharge Diagnoses:  Principal Problem:   Acute arterial ischemic stroke, vertebrobasilar, brainstem, right (HCC) Active Problems:   Diabetic peripheral neuropathy associated with type 2 diabetes mellitus (HCC)   Nondisplaced fracture of greater trochanter of left femur, initial encounter for closed fracture (HCC)   Adjustment disorder with anxious mood Functional deficits secondary to acute right cerebral peduncle stroke Hypertension Hyperlipidemia Left greater trochanter fracture Hyponatremia Vitamin D deficiency   Discharged Condition: stable  Significant Diagnostic Studies:  Narrative & Impression  CLINICAL DATA:  Pain after fall   EXAM: DG HIP (WITH OR WITHOUT PELVIS) 3V LEFT   COMPARISON:  02/16/2023   FINDINGS: Osteopenia. Preserved joint spaces. Subtle deformity along the tip of the left hip greater trochanter. Subtle fracture.   IMPRESSION: Stable subtle fracture along the superior aspect of the left greater trochanter. Fracture line is more lucent today.   Underlying osteopenia.     Electronically Signed   By: Karen Kays M.D.   On: 02/27/2023 14:34        Labs:  Basic Metabolic Panel: Recent Labs  Lab 03/05/23 0657  NA 134*  K 3.9  CL 102  CO2 24  GLUCOSE 107*  BUN 19  CREATININE 0.78  CALCIUM 9.3    CBC: Recent Labs  Lab 03/05/23 0657  WBC 7.5  HGB 15.0  HCT 44.7  MCV 91.8  PLT 427*    CBG: Recent Labs  Lab 03/07/23 0605 03/07/23 1205 03/07/23 1634 03/08/23 0652 03/08/23 0844  GLUCAP 77 158* 113* 63* 200*    Brief HPI:   [name] is a 72 y.o. male who presented to the ED at [facility] who fell to ground after his left leg gave way. He was walking home and developed BLE weakness and fell in his yard. He complained of left hip pain. Workup revealed WBC 14.5, BP 153/91,  HR 103. CT head and chest performed and without acute abnormality. CT of left hip with minimally displaced left greater trochanter fracture. Orthopedic surgery consulted and managed non-operatively. He is WBAT with a walker. He has a history of DM and BLE peripheral neuropathy. History of prior stroke on aspirin. MRI performed showed probable acute ischemic punctate infarct in the anteromedial aspect of the right cerebral peduncle, artifact could not be ruled out given that there is no associated FLAIR signal abnormality. Neurology consulted and placed on Plavix as well as aspirin. DAPT for 21 days followed by Plavix alone. A1c 13.1%. Diabetic coordinator consulted for uncontrolled diabetes. Hypertension history at home on Hyzaar and verapamil. 2D echo with EF ~60-65%. He is tolerating a regular diet.    Hospital Course: DMIR RIEF was admitted to rehab 02/20/2023 for inpatient therapies to consist of PT, ST and OT at least three hours five days a week. Past admission physiatrist, therapy team and rehab RN have worked together to provide customized collaborative inpatient rehab. Follow-up labs on 5/01 with serum sodium of 134; improved to 134 on 5/06. mild thrombocytopenia of 428k noted on 5/06. Complained of left hip pain and x-ray obtained on 5/07 without acute findings. Neuropsychology eval performed on 5/10.  Hip pain resolved. Discussed in detail changes to home medications and need to check CBGs 4 times daily; BP at least once daily.  Blood pressures were monitored on TID basis and remained controlled without medication. Magnesium gluconate 250  mg added at bedtime.  Diabetes has been monitored with ac/hs CBG checks and SSI was use prn for tighter BS control. Novolog 5 units with meals and Levemir 30 units in the morning discontinued and increase of to 25 units at bedtime at admission per diabetic coordinator's recs. Due to hyperglycemia, re-started meal coverage at 3 units and increased nightly  Levemir to 30 units. Increased meal coverage to 6 units on 5/03. In creased Levemir to 31 units at HS on 5/07, to 32 units on 5/08, 32 units on 5/09. Increased to 35 units on 5/12. Hypoglycemia noted morning of 5/14 and nighttime SSI discontinued. Weaned off SSI. Restarted metformin 500 mg with breakfast on 5/16.   Rehab course: During patient's stay in rehab weekly team conferences were held to monitor patient's progress, set goals and discuss barriers to discharge. At admission, patient required max with mobility and min to max assist with basic self-care skills.  He has had improvement in activity tolerance, balance, postural control as well as ability to compensate for deficits. He has had improvement in functional use RUE/LUE  and RLE/LLE as well as improvement in awareness. Patient has met 9 of 9 long term goals due to improved activity tolerance, improved balance, postural control, ability to compensate for deficits, functional use of  LEFT upper and LEFT lower extremity, improved attention, improved awareness, and improved coordination.  Patient to discharge at overall  Supervision-CGA  level. Home health Pt and OT arranged.  Discharge disposition: 01-Home or Self Care        Diet: carb modified  Special Instructions: No driving, alcohol consumption or tobacco use.  Follow-up liver functions tests (total bilirubin) as outpatient.  Recommend checking fingerstick blood sugars four (4) times daily and record. Bring this information with you to follow-up appointment with PCP.  Recommend daily BP measurement in same arm and record time of day. Bring this information with you to follow-up appointment with PCP. Discharge Instructions     Ambulatory referral to Neurology   Complete by: As directed    An appointment is requested in approximately: 4 weeks   Ambulatory referral to Physical Medicine Rehab   Complete by: As directed    Hospital follow-up   Discharge patient   Complete by:  As directed    Discharge disposition: 01-Home or Self Care   Discharge patient date: 03/08/2023      Allergies as of 03/08/2023       Reactions   Bee Venom Anaphylaxis   Penicillins Anaphylaxis        Medication List     STOP taking these medications    liraglutide 18 MG/3ML Sopn Commonly known as: VICTOZA   losartan-hydrochlorothiazide 100-25 MG tablet Commonly known as: HYZAAR   ondansetron 4 MG disintegrating tablet Commonly known as: ZOFRAN-ODT   oxyCODONE 5 MG immediate release tablet Commonly known as: Oxy IR/ROXICODONE   rosuvastatin 10 MG tablet Commonly known as: CRESTOR   verapamil 180 MG 24 hr capsule Commonly known as: VERELAN       TAKE these medications    acetaminophen 325 MG tablet Commonly known as: TYLENOL Take 1-2 tablets (325-650 mg total) by mouth every 4 (four) hours as needed for mild pain.   aspirin EC 81 MG tablet Take 1 tablet (81 mg total) by mouth daily. What changed: additional instructions   atorvastatin 40 MG tablet Commonly known as: LIPITOR Take 1 tablet (40 mg total) by mouth daily.   BD Pen Needle Micro U/F 32G X 6  MM Misc Generic drug: Insulin Pen Needle Use pen needles for insulin injection each evening   calcium carbonate 500 MG chewable tablet Commonly known as: TUMS - dosed in mg elemental calcium Chew 1 tablet (200 mg of elemental calcium total) by mouth daily after supper.   clopidogrel 75 MG tablet Commonly known as: PLAVIX Take 1 tablet (75 mg total) by mouth daily.   gabapentin 600 MG tablet Commonly known as: NEURONTIN Take 1 tablet (600 mg total) by mouth at bedtime.   Levemir FlexPen 100 UNIT/ML FlexPen Generic drug: insulin detemir Inject 35 units under the skin every evening at bedtime What changed: additional instructions   magnesium oxide 400 MG tablet Commonly known as: MAG-OX Take 1/2 tablets (200 mg total) by mouth at bedtime.   metFORMIN 1000 MG tablet Commonly known as:  GLUCOPHAGE Take 0.5 tablets (500 mg total) by mouth every morning. Take with breakfast. What changed:  how much to take when to take this additional instructions   multivitamin with minerals Tabs tablet Take 1 tablet by mouth daily.   Vitamin D (Ergocalciferol) 1.25 MG (50000 UNIT) Caps capsule Commonly known as: DRISDOL Take 1 capsule (50,000 Units total) by mouth every 7 (seven) days. Start taking on: Mar 14, 2023        Follow-up Information     Gildardo Pounds, PA Follow up.   Specialty: Physician Assistant Why: Call the office in 1-2 days to make arrangements for hospital follow-up. Contact information: [address] [place] [address] [phone]         Horton Chin, MD Follow up.   Specialty: Physical Medicine and Rehabilitation Why: office will call you to arrange your appt (sent) Contact information: [address] [place] [address] [phone]         [facility] Follow up.   Why: Call the office in 1-2 days to make arrangements for hospital follow-up. Contact information: [address]     [address] [place] [address] [phone]        Poggi, Excell Seltzer, MD Follow up.   Specialty: Orthopedic Surgery Why: Call the office in 1-2 days to make arrangements for hospital follow-up. Contact information: [address] [facility] [place] [address] [phone]                 Signed: Milinda Antis 03/08/2023, 9:23 AM

## 2023-02-24 NOTE — Discharge Instructions (Addendum)
Inpatient Rehab Discharge Instructions  [name] Discharge date and time: 03/08/2023  Activities/Precautions/ Functional Status: Activity: no lifting, driving, or strenuous exercise until cleared by MD Diet: diabetic diet Wound Care: none  Functional status:  ___ No restrictions     ___ Walk up steps independently ___ 24/7 supervision/assistance   ___ Walk up steps with assistance __x_ Intermittent supervision/assistance  ___ Bathe/dress independently ___ Walk with walker     ___ Bathe/dress with assistance ___ Walk Independently    ___ Shower independently ___ Walk with assistance    _x__ Shower with assistance __x_ No alcohol     ___ Return to work/school ________  Special Instructions: No driving, alcohol consumption or tobacco use.  Recommend checking fingerstick blood sugars four (4) times daily and record. Bring this information with you to follow-up appointment with PCP.  Recommend daily BP measurement in same arm and record time of day. Bring this information with you to follow-up appointment with PCP.   COMMUNITY REFERRALS UPON DISCHARGE:    Home Health:   PT   & OT                Agency: [facility]  Phone: [phone]   Medical Equipment/Items Ordered: PATIENT HAS A ROLLING WALKER DAUGHTER TO GET BEDSIDE COMMODE AND TUB BENCH DUE TO NOT COVERED BY HIS INSURANCE                                                 TO APPLY FOR MEDICARE PART B ONCE OPEN ENROLLMENT LATER THIS YEAR    STROKE/TIA DISCHARGE INSTRUCTIONS SMOKING Cigarette smoking nearly doubles your risk of having a stroke & is the single most alterable risk factor  If you smoke or have smoked in the last 12 months, you are advised to quit smoking for your health. Most of the excess cardiovascular risk related to smoking disappears within a year of stopping. Ask you doctor about anti-smoking medications [place] Quit Line: 1-800-QUIT NOW Free Smoking Cessation Classes [phone]  CHOLESTEROL Know your  levels; limit fat & cholesterol in your diet  Lipid Panel     Component Value Date/Time   CHOL 142 01/07/2012 0635   TRIG 181 (H) 01/07/2012 0635   HDL 17 (L) 01/07/2012 0635   CHOLHDL 8.4 01/07/2012 0635   VLDL 36 01/07/2012 0635   LDLCALC 89 01/07/2012 0635     Many patients benefit from treatment even if their cholesterol is at goal. Goal: Total Cholesterol (CHOL) less than 160 Goal:  Triglycerides (TRIG) less than 150 Goal:  HDL greater than 40 Goal:  LDL (LDLCALC) less than 100   BLOOD PRESSURE American Stroke Association blood pressure target is less that 120/80 mm/Hg  Your discharge blood pressure is:  BP: 104/74 Monitor your blood pressure Limit your salt and alcohol intake Many individuals will require more than one medication for high blood pressure  DIABETES (A1c is a blood sugar average for last 3 months) Goal HGBA1c is under 7% (HBGA1c is blood sugar average for last 3 months)  Diabetes: Diagnosis of diabetes:  Your A1c:13.1 %    Lab Results  Component Value Date   HGBA1C 13.1 (H) 02/17/2023    Your HGBA1c can be lowered with medications, healthy diet, and exercise. Check your blood sugar as directed by your physician Call your physician if you experience unexplained or  low blood sugars.  PHYSICAL ACTIVITY/REHABILITATION Goal is 30 minutes at least 4 days per week  Activity: Increase activity slowly, Therapies: Physical Therapy: Home Health and Occupational Therapy: Home Health Return to work: when cleared by MD Activity decreases your risk of heart attack and stroke and makes your heart stronger.  It helps control your weight and blood pressure; helps you relax and can improve your mood. Participate in a regular exercise program. Talk with your doctor about the best form of exercise for you (dancing, walking, swimming, cycling).  DIET/WEIGHT Goal is to maintain a healthy weight  Your discharge diet is:  Diet Order             Diet Carb Modified Fluid  consistency: Thin; Room service appropriate? Yes with Assist  Diet effective now                  thin liquids Your height is:  Height: 5\' 4"  (162.6 cm) Your current weight is: Weight: 64.6 kg Your Body Mass Index (BMI) is:  BMI (Calculated): 24.43 Following the type of diet specifically designed for you will help prevent another stroke. Your goal weight range is:   Your goal Body Mass Index (BMI) is 19-24. Healthy food habits can help reduce 3 risk factors for stroke:  High cholesterol, hypertension, and excess weight.  RESOURCES Stroke/Support Group:  Call [phone]   STROKE EDUCATION PROVIDED/REVIEWED AND GIVEN TO PATIENT Stroke warning signs and symptoms How to activate emergency medical system (call 911). Medications prescribed at discharge. Need for follow-up after discharge. Personal risk factors for stroke. Pneumonia vaccine given: No Flu vaccine given: No My questions have been answered, the writing is legible, and I understand these instructions.  I will adhere to these goals & educational materials that have been provided to me after my discharge from the hospital.     My questions have been answered and I understand these instructions. I will adhere to these goals and the provided educational materials after my discharge from the hospital.  Patient/Caregiver Signature _______________________________ Date __________  Clinician Signature _______________________________________ Date __________  Please bring this form and your medication list with you to all your follow-up doctor's appointments.

## 2023-02-24 NOTE — Progress Notes (Addendum)
PROGRESS NOTE   Subjective/Complaints: Sitting in his wheelchair.  He had just finished therapy.  Reports some anterior hip pain during therapy however controlled with medications.  Reports he has not had a bowel movement in a few days.  ROS: +insomnia, +lightheadedness, +headache, +constipation, denies chest pain or shortness of breath   Objective:   No results found. No results for input(s): "WBC", "HGB", "HCT", "PLT" in the last 72 hours.  Recent Labs    02/22/23 0635  NA 132*  K 4.1  CL 96*  CO2 27  GLUCOSE 123*  BUN 22  CREATININE 0.80  CALCIUM 8.9     Intake/Output Summary (Last 24 hours) at 02/24/2023 0947 Last data filed at 02/24/2023 0727 Gross per 24 hour  Intake 718 ml  Output 1650 ml  Net -932 ml         Physical Exam: Vital Signs Blood pressure 123/85, pulse 83, temperature 98.1 F (36.7 C), temperature source Oral, resp. rate 18, height 5\' 4"  (1.626 m), weight 64.4 kg, SpO2 93 %. Gen: no distress, normal appearing, BMI 24.37 HEENT: oral mucosa pink and moist, NCAT, rosacea on face Cardiovascular:     Rate and Rhythm: Normal rate and regular rhythm.     Heart sounds: Normal heart sounds. No murmur heard.    No gallop.     Comments: Sternal noncardiac TTP- some associated bruising- no obvious abrasions Pulmonary:     Effort: pulmonary effort is normal. No respiratory distress.     Breath sounds: CTAB.  Good air movement. No wheezing or rales.  Abdominal:     General: Bowel sounds are normal. There is no distension.     Palpations: Abdomen is soft.     Tenderness: There is no abdominal tenderness.     Comments: normoactive  Musculoskeletal:     Cervical back: Neck supple. No tenderness.     Comments: RUE 5-/5 in biceps, triceps, WE, grip and FA LUE- 4/5 in same muscles- FA 4-/5 RLE- 5-/5 in HF, KE, DF and PF LLE- HF 1 to 2-/5; DF 3-/5 and PF 3-/5- said too painful to do knee movement   Skin:    General: Skin is warm and dry.     Comments: Peeling on face and head with cradle cap/psoriasis? On head Balding Bruise on sternum from fall -did not visualize today Neurological:     Mental Status: He is alert.     Comments: Decreased to light touch on L side LUE/LLE Slightly delayed responses Ox3 with cues  Psychiatric:     Comments: Flat, appropriate   Assessment/Plan: 1. Functional deficits which require 3+ hours per day of interdisciplinary therapy in a comprehensive inpatient rehab setting. Physiatrist is providing close team supervision and 24 hour management of active medical problems listed below. Physiatrist and rehab team continue to assess barriers to discharge/monitor patient progress toward functional and medical goals  Care Tool:  Bathing    Body parts bathed by patient: Left arm, Chest, Abdomen, Right upper leg, Left upper leg, Face   Body parts bathed by helper: Buttocks, Front perineal area, Left lower leg, Right lower leg, Right arm     Bathing assist Assist Level:  Maximal Assistance - Patient 24 - 49%     Upper Body Dressing/Undressing Upper body dressing   What is the patient wearing?: Pull over shirt    Upper body assist Assist Level: Moderate Assistance - Patient 50 - 74%    Lower Body Dressing/Undressing Lower body dressing      What is the patient wearing?: Pants, Incontinence brief     Lower body assist Assist for lower body dressing: Total Assistance - Patient < 25%     Toileting Toileting    Toileting assist Assist for toileting: Maximal Assistance - Patient 25 - 49%     Transfers Chair/bed transfer  Transfers assist     Chair/bed transfer assist level: Maximal Assistance - Patient 25 - 49%     Locomotion Ambulation   Ambulation assist      Assist level: Moderate Assistance - Patient 50 - 74% Assistive device: Fara Boros Max distance: 51ft   Walk 10 feet activity   Assist  Walk 10 feet activity did  not occur: Safety/medical concerns (fatigue and L hip pain)  Assist level: Moderate Assistance - Patient - 50 - 74% Assistive device: Walker-Eva   Walk 50 feet activity   Assist Walk 50 feet with 2 turns activity did not occur: Safety/medical concerns  Assist level: Moderate Assistance - Patient - 50 - 74% Assistive device: Walker-Eva    Walk 150 feet activity   Assist Walk 150 feet activity did not occur: Safety/medical concerns         Walk 10 feet on uneven surface  activity   Assist Walk 10 feet on uneven surfaces activity did not occur: Safety/medical concerns         Wheelchair     Assist Is the patient using a wheelchair?: Yes Type of Wheelchair: Manual    Wheelchair assist level: Dependent - Patient 0% (L hemi)      Wheelchair 50 feet with 2 turns activity    Assist        Assist Level: Dependent - Patient 0%   Wheelchair 150 feet activity     Assist      Assist Level: Dependent - Patient 0%   Blood pressure 123/85, pulse 83, temperature 98.1 F (36.7 C), temperature source Oral, resp. rate 18, height 5\' 4"  (1.626 m), weight 64.4 kg, SpO2 93 %.    Medical Problem List and Plan: 1. 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  Antithrombotics: -DVT/anticoagulation:  Pharmaceutical: Lovenox             -antiplatelet therapy: Aspirin and Plavix for three weeks followed by Plavix alone   3. Pain Management: Tylenol, oxycodone as needed- pt doesn't want "pain meds' but willing to try Tylenol- pain 8-9/10- but explained cannot do BC powders  -5/4 discussed trying pain meds before therapy help with activity tolerance   4. Mood/Behavior/Sleep: LCSW to evaluate and provide emotional support             -antipsychotic agents: n/a   5. Neuropsych/cognition: This  patient is capable of making decisions on his own behalf.   6. Skin/Wound Care: Routine skin care checks   7. Fluids/Electrolytes/Nutrition: Routine Is and Os and follow-up chemistries   8: Hypertension: monitor TID  and prn (home: Hyzaar 100/25 daily, verapamil 240 mg daily). Add magnesium gluconate 250mg  HS   9: Hyperlipidemia: continue statin   10: Peripheral neuropathy: continue gabapentin 600 mg q HS, discuss Qutenza   11:Left greater trochanter fracture: non-operative             -WBAT with a walker             -follow-up with Dr. Joice Lofts  -kpad ordered   12: DM-2: CBGs QID, carb modified diet; home: (Levemir 50 units BID; Victoza 1.8 mg daily, metformin 1000 mg BID) A1c 13.1%             -continue SSI             -increase Novolog to 6 units with meals             -continue Levemir 30 units in AM, increase Levemir to 26 units at bedtime             -suggest bringing Victoza in from home.   -provided list of foods for diabetics  -placed order for no drinks but water, coffee, and tea  -5/4 increase Levemir to 30 units  13: Hyponatremia:monitor weekly.   -Recheck Monday   14: Elevated bilirubin: follow-up with PCP outpatient.    15: Vitamin D deficiency: continue weekly supplementation for 8 weeks  16. Lightheadedness: decrease oxycodone to 2.5mg  q8H prn  17. Headache: called nursing to bring tylenol for him.  18. Constipation   -5/4 Sorbitol ordered    LOS: 4 days A FACE TO FACE EVALUATION WAS PERFORMED  Fanny Dance 02/24/2023, 9:47 AM

## 2023-02-24 NOTE — Progress Notes (Signed)
Physical Therapy Session Note  Patient Details  Name: [name] MRN: [id number] Date of Birth: [birth year]  Today's Date: 02/24/2023 PT Individual Time: 0806-0903 PT Individual Time Calculation (min): 57 min   Short Term Goals: Week 1:  PT Short Term Goal 1 (Week 1): Pt will complete bed mobility with modA PT Short Term Goal 2 (Week 1): Pt will complete bed<>chair transfers with modA and LRAD PT Short Term Goal 3 (Week 1): Pt will ambulate 23ft with modA and LRAD PT Short Term Goal 4 (Week 1): Pt will initiate stair training  Skilled Therapeutic Interventions/Progress Updates:    Pt received supine in bed awake, watching TV and agreeable to therapy session. Performed L LE hip/knee flexion AAROM x10reps with pt having limited hip flexion activation and reporting some pain, but "tolerable." Supine in bed, donned pants with total assist for time management - pt unable to bridge to sufficiently lift hips and pull pants up. Supine>sitting L EOB, pt reports unable to tolerate rolling onto L hip, HOB partially elevated and using bedrail with light mod assist for L LE management and to bring trunk upright. Sit>stand x2 partially elevated EOB>RW with light mod assist for lifting to stand and pt with good recall to push up with R hand from bed. R stand pivot EOB>w/c using RW with light mod assist and therapist having to provide max cuing to attend to stepping sufficiently with L LE to bring it back during transfer and turn RW fully. Transported to/from gym in w/c for time management and energy conservation.  Gait training 64ft using RW with mod assist for balance, L LE management, and AD control. Pt demonstrating the following gait deviations with therapist providing the described cuing and facilitation for improvement:  - maintains forward trunk flexed posture with downward gaze - lacks L LE foot clearance during swing, primarily step-to pattern with occasional ability to slightly move LLE  past R LE  and therapist intermittently manually facilitating increased L step length - L LE slightly giving way intermittently during stance, with pt reporting this is due to weakness and not pain, but appears antalgic in nature - significantly decreased gait speed  Therapist swapped out pt's RW for one of appropriate height.   Standing L LE NMR focusing on glute and quad activation to achieve full L LE extension via standing with B UE support on RW, mirror feedback, while performing "mini squats" and coming to full L hip/knee extension x8 reps with pt reporting pain with this and likely this is why pt is lacking sufficient hip/knee extension during gait.  R stand pivot EOM>w/c using RW with light mod/heavy min assist for facilitating weight shifting and turning AD fully. Transported back to his room and pt agreeable to remain up in w/c - left with needs in reach and seat belt alarm on.   Therapy Documentation Precautions:  Precautions Precautions: Fall Precaution Comments: L hip fx managed non-op, L hemi Restrictions Weight Bearing Restrictions: Yes LLE Weight Bearing: Weight bearing as tolerated   Pain:   Pt reporting pain 7-8/10 - nurse notified for medication administration at beginning of session - provided rest breaks and modifications of interventions for pain management.   Therapy/Group: Individual Therapy  Ginny Forth , PT, DPT, NCS, CSRS 02/24/2023, 7:59 AM

## 2023-02-24 NOTE — Progress Notes (Signed)
Occupational Therapy Session Note  Patient Details  Name: [name] MRN: [id number] Date of Birth: 16-Jun-1951  Today's Date: 02/24/2023 OT Individual Time: 1104-1150 OT Individual Time Calculation (min): 46 min    Short Term Goals: Week 1:  OT Short Term Goal 1 (Week 1): Patient will complete toilet trasnfer with mod assist OT Short Term Goal 2 (Week 1): Ptient will dress lower body with mod assist OT Short Term Goal 3 (Week 1): Patient will don pull over shirt with min assist OT Short Term Goal 4 (Week 1): Patient will transition to standing with min assist with use of grab bar or UE support OT Short Term Goal 5 (Week 1): Patient will utilize LUE to aide with bathing and dressing with min cueing  Skilled Therapeutic Interventions/Progress Updates:    OT session focused on sit<>stand and endurance. Pt received supine in bed requesting to work on strengthening his LLE. Completed supine>sit with min A and time. Pt completed sit<>stand 6x with min A and remained in standing for 1-2 min with min A. Attempted marching in place, however max difficulty noted. Sitting EOB, engaged in LLE leg extensions with min A. Pt reporting fatigue and dizziness, requesting to end session. Immediately transitioned to supine with BP reading 127/74. Pt verbalized dizziness had subsided upon transition to lying down, however declined continuation of therapy d/t fatigue. Pt left supine in bed with all needs in reach.   Therapy Documentation Precautions:  Precautions Precautions: Fall Precaution Comments: L hip fx managed non-op, L hemi Restrictions Weight Bearing Restrictions: Yes LLE Weight Bearing: Weight bearing as tolerated General: General OT Amount of Missed Time: 14 Minutes Vital Signs:   Pain: Pain Assessment Pain Score: 0-No pain ADL: ADL Eating: Minimal assistance Where Assessed-Eating: Chair Grooming: Minimal assistance Where Assessed-Grooming: Sitting at sink Upper Body Bathing:  Minimal assistance Where Assessed-Upper Body Bathing: Chair Lower Body Bathing: Maximal assistance Where Assessed-Lower Body Bathing: Chair Upper Body Dressing: Moderate assistance Where Assessed-Upper Body Dressing: Chair Lower Body Dressing: Maximal assistance Where Assessed-Lower Body Dressing: Chair Toileting: Maximal assistance Where Assessed-Toileting: Other (Comment) Toilet Transfer: Maximal assistance Toilet Transfer Method: Squat pivot Toilet Transfer Equipment: Bedside commode Tub/Shower Transfer: Unable to assess Tub/Shower Transfer Method: Unable to assess Film/video editor: Maximal assistance Film/video editor Method: Warden/ranger: Transfer tub bench ADL Comments: difficult time accepting weight on LLE Vision   Perception    Praxis   Balance   Exercises:   Other Treatments:     Therapy/Group: Individual Therapy  Daneil Dan 02/24/2023, 12:46 PM

## 2023-02-25 DIAGNOSIS — M25552 Pain in left hip: Secondary | ICD-10-CM

## 2023-02-25 DIAGNOSIS — I10 Essential (primary) hypertension: Secondary | ICD-10-CM

## 2023-02-25 LAB — GLUCOSE, CAPILLARY
Glucose-Capillary: 130 mg/dL — ABNORMAL HIGH (ref 70–99)
Glucose-Capillary: 269 mg/dL — ABNORMAL HIGH (ref 70–99)
Glucose-Capillary: 252 mg/dL — ABNORMAL HIGH (ref 70–99)

## 2023-02-25 MED ORDER — OXYCODONE HCL 5 MG PO TABS
2.5000 mg | ORAL_TABLET | Freq: Four times a day (QID) | ORAL | Status: DC | PRN
  Administered 2023-02-26 – 2023-02-28 (×4): 2.5 mg via ORAL
  Filled 2023-02-25 (×7): qty 1

## 2023-02-25 NOTE — Progress Notes (Signed)
PROGRESS NOTE   Subjective/Complaints: Seen at bedside. No new complaints this AM. He continues to have hip pain, improved with oxycodone.    LBM yesterday by chart review- large  ROS: +insomnia, +lightheadedness, +headache, no N/V or abdominal pain, denies chest pain or shortness of breath   Objective:   No results found. No results for input(s): "WBC", "HGB", "HCT", "PLT" in the last 72 hours.  No results for input(s): "NA", "K", "CL", "CO2", "GLUCOSE", "BUN", "CREATININE", "CALCIUM" in the last 72 hours.   Intake/Output Summary (Last 24 hours) at 02/25/2023 0916 Last data filed at 02/25/2023 0452 Gross per 24 hour  Intake 236 ml  Output 820 ml  Net -584 ml         Physical Exam: Vital Signs Blood pressure 116/76, pulse 76, temperature 98.4 F (36.9 C), temperature source Oral, resp. rate 18, height 5\' 4"  (1.626 m), weight 64.4 kg, SpO2 94 %. Gen: no distress, normal appearing, BMI 24.37 HEENT: oral mucosa pink and moist, NCAT, rosacea on face Cardiovascular:     Rate and Rhythm: Normal rate and regular rhythm.     Heart sounds: Normal heart sounds. No murmur heard.    No gallop.  Pulmonary:     Effort: pulmonary effort is normal. No respiratory distress.     Breath sounds: CTAB, no increase WOB Abdominal:     General: Bowel sounds are normal. There is no distension.     Palpations: Abdomen is soft.     Tenderness: There is no abdominal tenderness.     Comments: normoactive  BS Musculoskeletal:     Cervical back: Neck supple. No tenderness.     Comments: RUE 5-/5 in biceps, triceps, WE, grip and FA LUE- 4/5 in same muscles- FA 4-/5 RLE- 5-/5 in HF, KE, DF and PF LLE- HF 1 to 2-/5; DF 3-/5 and PF 3-/5- said too painful to do knee movement  Skin:    General: Skin is warm and dry.     Comments: Peeling on face and head with cradle cap/psoriasis? On head Balding Bruise on sternum from fall -did not visualize  today Neurological:     Mental Status: Sleeping when I first came in but wakes to voice.  Follows commands     Comments: Decreased to light touch on L side LUE/LLE Slightly delayed responses Ox3 with cues  Psychiatric:     Comments: Flat, appropriate   Assessment/Plan: 1. Functional deficits which require 3+ hours per day of interdisciplinary therapy in a comprehensive inpatient rehab setting. Physiatrist is providing close team supervision and 24 hour management of active medical problems listed below. Physiatrist and rehab team continue to assess barriers to discharge/monitor patient progress toward functional and medical goals  Care Tool:  Bathing    Body parts bathed by patient: Left arm, Chest, Abdomen, Right upper leg, Left upper leg, Face   Body parts bathed by helper: Buttocks, Front perineal area, Left lower leg, Right lower leg, Right arm     Bathing assist Assist Level: Maximal Assistance - Patient 24 - 49%     Upper Body Dressing/Undressing Upper body dressing   What is the patient wearing?: Pull over shirt  Upper body assist Assist Level: Moderate Assistance - Patient 50 - 74%    Lower Body Dressing/Undressing Lower body dressing      What is the patient wearing?: Pants, Incontinence brief     Lower body assist Assist for lower body dressing: Total Assistance - Patient < 25%     Toileting Toileting    Toileting assist Assist for toileting: Maximal Assistance - Patient 25 - 49%     Transfers Chair/bed transfer  Transfers assist     Chair/bed transfer assist level: Maximal Assistance - Patient 25 - 49%     Locomotion Ambulation   Ambulation assist      Assist level: Moderate Assistance - Patient 50 - 74% Assistive device: Fara Boros Max distance: 45ft   Walk 10 feet activity   Assist  Walk 10 feet activity did not occur: Safety/medical concerns (fatigue and L hip pain)  Assist level: Moderate Assistance - Patient - 50 -  74% Assistive device: Walker-Eva   Walk 50 feet activity   Assist Walk 50 feet with 2 turns activity did not occur: Safety/medical concerns  Assist level: Moderate Assistance - Patient - 50 - 74% Assistive device: Walker-Eva    Walk 150 feet activity   Assist Walk 150 feet activity did not occur: Safety/medical concerns         Walk 10 feet on uneven surface  activity   Assist Walk 10 feet on uneven surfaces activity did not occur: Safety/medical concerns         Wheelchair     Assist Is the patient using a wheelchair?: Yes Type of Wheelchair: Manual    Wheelchair assist level: Dependent - Patient 0% (L hemi)      Wheelchair 50 feet with 2 turns activity    Assist        Assist Level: Dependent - Patient 0%   Wheelchair 150 feet activity     Assist      Assist Level: Dependent - Patient 0%   Blood pressure 116/76, pulse 76, temperature 98.4 F (36.9 C), temperature source Oral, resp. rate 18, height 5\' 4"  (1.626 m), weight 64.4 kg, SpO2 94 %.    Medical Problem List and Plan: 1. 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  Antithrombotics: -DVT/anticoagulation:  Pharmaceutical: Lovenox             -antiplatelet therapy: Aspirin and Plavix for three weeks followed by Plavix alone   3. Pain Management: Tylenol, oxycodone as needed- pt doesn't want "pain meds' but willing to try Tylenol- pain 8-9/10- but explained cannot do BC powders  -5/4 discussed trying pain meds before therapy help with activity tolerance  -5/5 will increase oxycodone frequency form Q8h to Q6h PRN, continue 2.5mg  dose    4. Mood/Behavior/Sleep: LCSW to evaluate and provide emotional support             -antipsychotic agents: n/a   5. Neuropsych/cognition: This patient is capable of making  decisions on his own behalf.   6. Skin/Wound Care: Routine skin care checks   7. Fluids/Electrolytes/Nutrition: Routine Is and Os and follow-up chemistries   8: Hypertension: monitor TID and prn (home: Hyzaar 100/25 daily, verapamil 240 mg daily). Add magnesium gluconate 250mg  HS  -  5/5 one elevated reading overall controlled, continue current regimen     02/25/2023    4:52 AM 02/24/2023    7:28 PM 02/24/2023    1:35 PM  Vitals with BMI  Systolic 116 165 161  Diastolic 76 96 78  Pulse 76 89 84      9: Hyperlipidemia: continue statin   10: Peripheral neuropathy: continue gabapentin 600 mg q HS, discuss Qutenza   11:Left greater trochanter fracture: non-operative             -WBAT with a walker             -follow-up with Dr. Joice Lofts  -kpad ordered   12: DM-2: CBGs QID, carb modified diet; home: (Levemir 50 units BID; Victoza 1.8 mg daily, metformin 1000 mg BID) A1c 13.1%             -continue SSI             -increase Novolog to 6 units with meals             -continue Levemir 30 units in AM, increase Levemir to 26 units at bedtime             -suggest bringing Victoza in from home.   -provided list of foods for diabetics  -placed order for no drinks but water, coffee, and tea  -5/4 increase Levemir to 30 units  5/5 CBGs elevated yesterday, better this AM, monitor response to levemir increase that started last night  CBG (last 3)  Recent Labs    02/24/23 1637 02/24/23 2059 02/25/23 0601  GLUCAP 276* 304* 130*     13: Hyponatremia:monitor weekly.   -Recheck Monday   14: Elevated bilirubin: follow-up with PCP outpatient.    15: Vitamin D deficiency: continue weekly supplementation for 8 weeks  16. Lightheadedness: decrease oxycodone to 2.5mg  q8H prn  17. Headache: called nursing to bring tylenol for him.  18. Constipation   -5/4 Sorbitol ordered  5/5 BM yesterday, improved    LOS: 5 days A FACE TO FACE EVALUATION WAS PERFORMED  Fanny Dance 02/25/2023,  9:16 AM

## 2023-02-25 NOTE — Progress Notes (Signed)
Occupational Therapy Session Note  Patient Details  Name: [name] MRN: [id number] Date of Birth: 05-May-1951  Today's Date: 02/25/2023 OT Individual Time: 1300-1340 OT Individual Time Calculation (min): 40 min    Short Term Goals: Week 1:  OT Short Term Goal 1 (Week 1): Patient will complete toilet trasnfer with mod assist OT Short Term Goal 2 (Week 1): Ptient will dress lower body with mod assist OT Short Term Goal 3 (Week 1): Patient will don pull over shirt with min assist OT Short Term Goal 4 (Week 1): Patient will transition to standing with min assist with use of grab bar or UE support OT Short Term Goal 5 (Week 1): Patient will utilize LUE to aide with bathing and dressing with min cueing  Skilled Therapeutic Interventions/Progress Updates:    Pt resting in bed upon arrival. OT intervention with focus on bed mobility and LUE strengthening to increase pt's independence with BADLs. Pt requires min A for repositioning in bed. Supine>sit EOB with min A. Sit>supine with mod A. LUE strengthening with 2# bar bell-circuit reaching out and up with focus on deltoids, biceps, and triceps-3x10. Pt remained in bed with all needs within reach. Bed alarm activated.   Therapy Documentation Precautions:  Precautions Precautions: Fall Precaution Comments: L hip fx managed non-op, L hemi Restrictions Weight Bearing Restrictions: Yes LLE Weight Bearing: Weight bearing as tolerated   Pain:  Pt denies pain (at rest) this afternoon  Therapy/Group: Individual Therapy  Rich Brave 02/25/2023, 2:48 PM

## 2023-02-25 NOTE — Progress Notes (Signed)
Occupational Therapy Session Note  Patient Details  Name: [name] MRN: [id number] Date of Birth: Aug 04, 1951  Today's Date: 02/25/2023 OT Individual Time: 0800-0900 OT Individual Time Calculation (min): 60 min    Short Term Goals: Week 1:  OT Short Term Goal 1 (Week 1): Patient will complete toilet trasnfer with mod assist OT Short Term Goal 2 (Week 1): Ptient will dress lower body with mod assist OT Short Term Goal 3 (Week 1): Patient will don pull over shirt with min assist OT Short Term Goal 4 (Week 1): Patient will transition to standing with min assist with use of grab bar or UE support OT Short Term Goal 5 (Week 1): Patient will utilize LUE to aide with bathing and dressing with min cueing  Skilled Therapeutic Interventions/Progress Updates:    Pt resting in bed upon arrival. Pt's bed soiled and pt incontinent of bladder. OT intervention with focus on bed mobility, functional transfers, sit<>stand, standing balance, bathing/dressing w/c level at sink, and activity tolerance to increase independence with BADLs. Pt continues to report increased fatigue and requiers multiple rest breaks. Supine>sit EOB with mod A HOB elevated. Squat pivot transfer to w/c with mod A. Bathing with sit<>stand from w/c at sink. Min A for sit<>stand and standing balance at sink using BUE for support. Pt required assistance bathing buttocks. Total A for LB dressing. REst breaks during ADLs.5 mins NuStep level 2 and 5 mins NuStep level 1 BLE only. Pt returned to room and transferred back to bed with min A. All needs within reach. Bed alarm activated.   Therapy Documentation Precautions:  Precautions Precautions: Fall Precaution Comments: L hip fx managed non-op, L hemi Restrictions Weight Bearing Restrictions: Yes LLE Weight Bearing: Weight bearing as tolerated :   Pain: Pain Assessment Pain Scale: 0-10 Pain Score: 5  Pain Type: Acute pain Pain Location: Hip Pain Orientation: Left Pain  Descriptors / Indicators: Aching Pain Frequency: Occasional Pain Onset: On-going Pain Intervention(s): pain meds admin at end of session; return to bed for rest    Therapy/Group: Individual Therapy  Rich Brave 02/25/2023, 10:11 AM

## 2023-02-25 NOTE — Progress Notes (Signed)
Physical Therapy Session Note  Patient Details  Name: [name] MRN: [id number] Date of Birth: [birth year]  Today's Date: 02/25/2023 PT Individual Time: 1031-1130 and 1501-1530 PT Individual Time Calculation (min): 59 min and 29 min.  Short Term Goals: Week 1:  PT Short Term Goal 1 (Week 1): Pt will complete bed mobility with modA PT Short Term Goal 2 (Week 1): Pt will complete bed<>chair transfers with modA and LRAD PT Short Term Goal 3 (Week 1): Pt will ambulate 18ft with modA and LRAD PT Short Term Goal 4 (Week 1): Pt will initiate stair training  Skilled Therapeutic Interventions/Progress Updates:   First session:  Pt presents supine in bed and reluctantly agreeable to therapy 2/2 pain L hip.  Pt states need to use urinal.  Pt required mod A for sup to sit at EOB to use urinal.  Pt managed clothing and continent of 200 ML urine in urinal, noted in Flowsheets.  Pt transferred sit to stand w/ min A and cues to complete urination, PT holding urinal.  Pt amb x 6' w/ RW and mod A, verbal cues for sequencing and weight shift for LLE advancement to sink.  Pt stood to wash hands.  Pt wheeled to small gym for energy conservation.  Pt amb x 12' w/ RW w/ mod A for LLE.  Pt performed UBE at Level 1 x 8' to improve strength and proprioception LUE, occasional verbal cues for improved grip L hand.  Pt performed LE there ex including LAQ, hip flexion and abd/add 3 x 10-15, AAROM to LLE for increased ROM.  Pt returned to room and transferred sit to stand w/ min A and amb x 10' to bed w/ RW and mod A.  Pt required mod A for sit to supine.  Bed alarm on and all needs in reach.  Second session: Pt presents supine in bed and agreeable to bedside there ex 2/2 fatigue.  Pt performs AP w/ man resistance to L PF, QS, HS (AAROM to L w/ resistive knee/hip ext), abd/add w/ AAROM LLE 3 x 10-15.  Pt required A to scoot to [facility], although uses R LE and UE.  Bed alarm on and handed off to NT for vitals.      Therapy  Documentation Precautions:  Precautions Precautions: Fall Precaution Comments: L hip fx managed non-op, L hemi Restrictions Weight Bearing Restrictions: Yes LLE Weight Bearing: Weight bearing as tolerated General:   Vital Signs:   Pain:7/10 Pain Assessment Pain Scale: 0-10 Pain Score: 5  Pain Type: Acute pain Pain Location: Hip Pain Orientation: Left Pain Descriptors / Indicators: Aching Pain Frequency: Occasional Pain Onset: On-going Pain Intervention(s): Medication (See eMAR)     Therapy/Group: Individual Therapy  [name] 02/25/2023, 12:23 PM

## 2023-02-26 LAB — BASIC METABOLIC PANEL
Anion gap: 11 (ref 5–15)
BUN: 23 mg/dL (ref 8–23)
CO2: 26 mmol/L (ref 22–32)
Calcium: 9.4 mg/dL (ref 8.9–10.3)
Chloride: 97 mmol/L — ABNORMAL LOW (ref 98–111)
Creatinine, Ser: 0.83 mg/dL (ref 0.61–1.24)
GFR, Estimated: >60 mL/min (ref >60)
Glucose, Bld: 191 mg/dL — ABNORMAL HIGH (ref 70–99)
Potassium: 4.2 mmol/L (ref 3.5–5.1)
Sodium: 134 mmol/L — ABNORMAL LOW (ref 135–145)

## 2023-02-26 LAB — GLUCOSE, CAPILLARY
Glucose-Capillary: 151 mg/dL — ABNORMAL HIGH (ref 70–99)
Glucose-Capillary: 169 mg/dL — ABNORMAL HIGH (ref 70–99)
Glucose-Capillary: 149 mg/dL — ABNORMAL HIGH (ref 70–99)
Glucose-Capillary: 246 mg/dL — ABNORMAL HIGH (ref 70–99)
Glucose-Capillary: 301 mg/dL — ABNORMAL HIGH (ref 70–99)

## 2023-02-26 LAB — CBC
HCT: 40.8 % (ref 39.0–52.0)
Hemoglobin: 14.3 g/dL (ref 13.0–17.0)
MCH: 31.4 pg (ref 26.0–34.0)
MCHC: 35 g/dL (ref 30.0–36.0)
MCV: 89.5 fL (ref 80.0–100.0)
Platelets: 428 10*3/uL — ABNORMAL HIGH (ref 150–400)
RBC: 4.56 MIL/uL (ref 4.22–5.81)
RDW: 12.2 % (ref 11.5–15.5)
WBC: 7.1 10*3/uL (ref 4.0–10.5)
nRBC: 0 % (ref 0.0–0.2)

## 2023-02-26 MED ORDER — INSULIN DETEMIR 100 UNIT/ML ~~LOC~~ SOLN
31.0000 [IU] | Freq: Every day | SUBCUTANEOUS | Status: DC
  Administered 2023-02-26 – 2023-02-27 (×2): 31 [IU] via SUBCUTANEOUS
  Filled 2023-02-26 (×3): qty 0.31

## 2023-02-26 MED ORDER — HYDROCERIN EX CREA
TOPICAL_CREAM | Freq: Two times a day (BID) | CUTANEOUS | Status: DC
  Filled 2023-02-26: qty 113

## 2023-02-26 MED ORDER — DICLOFENAC SODIUM 1 % EX GEL
2.0000 g | Freq: Four times a day (QID) | CUTANEOUS | Status: DC
  Administered 2023-02-26 – 2023-03-03 (×12): 2 g via TOPICAL
  Filled 2023-02-26: qty 100

## 2023-02-26 NOTE — Progress Notes (Signed)
Physical Therapy Session Note  Patient Details  Name: [name] MRN: [id number] Date of Birth: 1951-09-10  Today's Date: 02/26/2023 PT Individual Time: 0730-0841 PT Individual Time Calculation (min): 71 min   Short Term Goals: Week 1:  PT Short Term Goal 1 (Week 1): Pt will complete bed mobility with modA PT Short Term Goal 2 (Week 1): Pt will complete bed<>chair transfers with modA and LRAD PT Short Term Goal 3 (Week 1): Pt will ambulate 32ft with modA and LRAD PT Short Term Goal 4 (Week 1): Pt will initiate stair training  Skilled Therapeutic Interventions/Progress Updates:      Pt supine in bed watching TV - agreeable to therapy session. Reports L hip pain but that he received medication from nursing prior. Pt requesting to shower and get dressed. Supine<>Sitting EOB needing modA for trunk and LLE support, hospital bed features used. Able to scoot to EOB with time and cues. Used Stedy for safety to transfer patient from EOB to shower chair - minA for standing in the [place] and for assist with controlled lowering to sit. Pt continent of bladder once on shower chair, using the urinal. Pt washed himself using shower wand with mostly supervision, needing assist for back/buttock/legs. Encouraged using L arm/hand for washing for integration and awareness. Pt needing assist to dry off as well and then needed assist to transfer to the toilet as he feels the urge to have BM. Used Stedy and heavy modA for standing from low sitting shower chair. Continent of large BM - charted. TotalA for posterior pericare. Assisted with dressing with maxA for time at wheelchair level. Completed several stands using the Stedy with min/modA. Transported to day room rehab gym and setup at [employer] at wheelchair level, L70cm/sec resistance. Encouraged full ROM bilaterally and maintain adequate cadence to challenge - pt needing several rest breaks to complete the x8 minutes. Returned to his room and concluded session  seated in w/c with safety belt alarm on, call bell within reach.  Pt reporting concern about DC date, feels as though he won't be ready to go home by the 16th. Discussed progress and need to continue working hard in therapy. Also discussed concern for limited family assist/support as his wife is also in rehab for cancer. Pt becoming emotional during conversation.   Therapy Documentation Precautions:  Precautions Precautions: Fall Precaution Comments: L hip fx managed non-op, L hemi Restrictions Weight Bearing Restrictions: Yes LLE Weight Bearing: Weight bearing as tolerated General:     Therapy/Group: Individual Therapy  Anastasia Tompson P Demauri Advincula PT 02/26/2023, 7:16 AM

## 2023-02-26 NOTE — Progress Notes (Signed)
Occupational Therapy Session Note  Patient Details  Name: [name] MRN: [id number] Date of Birth: 01-Jul-1951  Today's Date: 02/26/2023 OT Individual Time: 0920-1030 OT Individual Time Calculation (min): 70 min    Short Term Goals: Week 1:  OT Short Term Goal 1 (Week 1): Patient will complete toilet trasnfer with mod assist OT Short Term Goal 2 (Week 1): Ptient will dress lower body with mod assist OT Short Term Goal 3 (Week 1): Patient will don pull over shirt with min assist OT Short Term Goal 4 (Week 1): Patient will transition to standing with min assist with use of grab bar or UE support OT Short Term Goal 5 (Week 1): Patient will utilize LUE to aide with bathing and dressing with min cueing  Skilled Therapeutic Interventions/Progress Updates:  Skilled OT intervention completed with focus on functional transfers, dynamic balance, LUE AROM. Pt received seated in w/c, agreeable to session. 5/10 pain reported in L hip; pre-medicated. MD consulted for potential topical med for increased comfort. OT offered rest breaks, ice and repositioning throughout for pain reduction.  Pt declined self-care needs stating he showered with PT this morning. Transported dependently in w/c > gym for time and energy conservation.  Mod A sit > stand using RW, then min A stand pivot to EOM using RW with min cues for stepping pattern when backing up to mat. Pt participated in placement/retrieval of squigz on/off long mirror. Sit > stand with CGA both trials using RW from mat at lowest setting. Able to maintain standing balance with RW with CGA, however intermittent seated rest break needed for fatigue.  While seated pt used B hands to place clips on thera band tied to RW, then completed series of sit > stands (5 total) all CGA/min A using RW, to transfer from RW to top of long mirror on band. Difficulty noted with L hand fine motor and gross grasp with higher level resistance clips but great AROM of L  shoulder. Series of seated rest breaks for fatigue. Transitioned to removal of clips from seated position on L hand for focus on grasp/fine motor.   Mod A stand pivot using RW to w/c with manual facilitation on LLE to advance forward due to fatigue. Transported dependently in w/c back to room, then similar transfer to EOB. Min A bed mobility for LLE only. Cues for bridging but no A needed.   Pt remained semi-upright in bed, with bed alarm on/activated, ice applied to L hip for pain reduction and with all needs in reach at end of session.   Therapy Documentation Precautions:  Precautions Precautions: Fall Precaution Comments: L hip fx managed non-op, L hemi Restrictions Weight Bearing Restrictions: Yes LLE Weight Bearing: Weight bearing as tolerated    Therapy/Group: Individual Therapy  Melvyn Novas, MS, OTR/L  02/26/2023, 12:16 PM

## 2023-02-26 NOTE — Progress Notes (Signed)
PROGRESS NOTE   Subjective/Complaints: No complaints from patient initially this morning, but [facility] OT messaged me that he has been reporting left hip pain, asks for topical medication  ROS: +insomnia, +lightheadedness, +headache, no N/V or abdominal pain, denies chest pain or shortness of breath, +left hip pain   Objective:   No results found. Recent Labs    02/26/23 0614  WBC 7.1  HGB 14.3  HCT 40.8  PLT 428*   Recent Labs    02/26/23 0614  NA 134*  K 4.2  CL 97*  CO2 26  GLUCOSE 191*  BUN 23  CREATININE 0.83  CALCIUM 9.4    Intake/Output Summary (Last 24 hours) at 02/26/2023 0954 Last data filed at 02/26/2023 0806 Gross per 24 hour  Intake 300 ml  Output 950 ml  Net -650 ml        Physical Exam: Vital Signs Blood pressure 120/66, pulse 72, temperature 98.9 F (37.2 C), temperature source Oral, resp. rate 18, height 5\' 4"  (1.626 m), weight 64.4 kg, SpO2 95 %. Gen: no distress, normal appearing, BMI 24.37 HEENT: oral mucosa pink and moist, NCAT, rosacea on face Cardiovascular:     Rate and Rhythm: Normal rate and regular rhythm.     Heart sounds: Normal heart sounds. No murmur heard.    No gallop.  Pulmonary:     Effort: pulmonary effort is normal. No respiratory distress.     Breath sounds: CTAB, no increase WOB Abdominal:     General: Bowel sounds are normal. There is no distension.     Palpations: Abdomen is soft.     Tenderness: There is no abdominal tenderness.     Comments: normoactive  BS Musculoskeletal:     Cervical back: Neck supple. No tenderness.     Comments: RUE 5-/5 in biceps, triceps, WE, grip and FA LUE- 4/5 in same muscles- FA 4-/5 RLE- 5-/5 in HF, KE, DF and PF LLE- HF 1 to 2-/5; DF 3-/5 and PF 3-/5- said too painful to do knee movement  Left hip TTP Skin:    General: Skin is warm and dry.     Comments: Peeling on face and head with cradle cap/psoriasis? On  head Balding Bruise on sternum from fall -did not visualize today Neurological:     Mental Status: Sleeping when I first came in but wakes to voice.  Follows commands     Comments: Decreased to light touch on L side LUE/LLE Slightly delayed responses Ox3 with cues  Psychiatric:     Comments: Flat, appropriate   Assessment/Plan: 1. Functional deficits which require 3+ hours per day of interdisciplinary therapy in a comprehensive inpatient rehab setting. Physiatrist is providing close team supervision and 24 hour management of active medical problems listed below. Physiatrist and rehab team continue to assess barriers to discharge/monitor patient progress toward functional and medical goals  Care Tool:  Bathing    Body parts bathed by patient: Right arm, Left arm, Chest, Abdomen, Front perineal area, Right upper leg, Left upper leg, Face   Body parts bathed by helper: Buttocks, Right lower leg, Left lower leg     Bathing assist Assist Level: Moderate Assistance - Patient  50 - 74%     Upper Body Dressing/Undressing Upper body dressing   What is the patient wearing?: Pull over shirt    Upper body assist Assist Level: Supervision/Verbal cueing    Lower Body Dressing/Undressing Lower body dressing      What is the patient wearing?: Pants, Incontinence brief     Lower body assist Assist for lower body dressing: Maximal Assistance - Patient 25 - 49%     Toileting Toileting    Toileting assist Assist for toileting: Maximal Assistance - Patient 25 - 49%     Transfers Chair/bed transfer  Transfers assist     Chair/bed transfer assist level: Maximal Assistance - Patient 25 - 49%     Locomotion Ambulation   Ambulation assist      Assist level: Moderate Assistance - Patient 50 - 74% Assistive device: Walker-rolling Max distance: 12   Walk 10 feet activity   Assist  Walk 10 feet activity did not occur: Safety/medical concerns (fatigue and L hip  pain)  Assist level: Moderate Assistance - Patient - 50 - 74% Assistive device: Walker-rolling   Walk 50 feet activity   Assist Walk 50 feet with 2 turns activity did not occur: Safety/medical concerns  Assist level: Moderate Assistance - Patient - 50 - 74% Assistive device: Walker-Eva    Walk 150 feet activity   Assist Walk 150 feet activity did not occur: Safety/medical concerns         Walk 10 feet on uneven surface  activity   Assist Walk 10 feet on uneven surfaces activity did not occur: Safety/medical concerns         Wheelchair     Assist Is the patient using a wheelchair?: Yes Type of Wheelchair: Manual    Wheelchair assist level: Moderate Assistance - Patient 50 - 74% Max wheelchair distance: 50    Wheelchair 50 feet with 2 turns activity    Assist        Assist Level: Moderate Assistance - Patient 50 - 74%   Wheelchair 150 feet activity     Assist      Assist Level: Dependent - Patient 0%   Blood pressure 120/66, pulse 72, temperature 98.9 F (37.2 C), temperature source Oral, resp. rate 18, height 5\' 4"  (1.626 m), weight 64.4 kg, SpO2 95 %.    Medical Problem List and Plan: 1. 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  Antithrombotics: -DVT/anticoagulation:  Pharmaceutical: Lovenox             -antiplatelet therapy: Aspirin and Plavix for three weeks followed by Plavix alone   3. Pain Management: Tylenol, oxycodone as needed- pt doesn't want "pain meds' but willing to try Tylenol- pain 8-9/10- but explained cannot do BC powders  -5/4 discussed trying pain meds before therapy help with activity tolerance  -5/5 will increase oxycodone frequency form Q8h to Q6h PRN, continue 2.5mg  dose    4. Mood/Behavior/Sleep: LCSW to evaluate and provide emotional  support             -antipsychotic agents: n/a   5. Neuropsych/cognition: This patient is capable of making decisions on his own behalf.   6. Skin/Wound Care: Routine skin care checks   7. Fluids/Electrolytes/Nutrition:  Routine Is and Os and follow-up chemistries   8: Hypertension: monitor TID and prn (home: Hyzaar 100/25 daily, verapamil 240 mg daily). Add magnesium gluconate 250mg  HS  -5/5 one elevated reading overall controlled, continue current regimen     02/26/2023    4:45 AM 02/25/2023    7:56 PM 02/25/2023    3:30 PM  Vitals with BMI  Systolic 120 123 696  Diastolic 66 76 73  Pulse 72 87 84      9: Hyperlipidemia: continue statin   10: Peripheral neuropathy: continue gabapentin 600 mg q HS, discuss Qutenza   11:Left greater trochanter fracture: non-operative             -WBAT with a walker             -follow-up with Dr. Joice Lofts  -kpad ordered   12: DM-2: CBGs QID, carb modified diet; home: (Levemir 50 units BID; Victoza 1.8 mg daily, metformin 1000 mg BID) A1c 13.1%             -continue SSI             -increase Novolog to 6 units with meals             -continue Levemir 30 units in AM, increase Levemir to 26 units at bedtime             -suggest bringing Victoza in from home.   -provided list of foods for diabetics  -placed order for no drinks but water, coffee, and tea  -5/4 increase Levemir to 30 units  Discussed improved control  CBG (last 3)  Recent Labs    02/25/23 1639 02/25/23 2104 02/26/23 0558  GLUCAP 269* 252* 169*     13: Hyponatremia:monitor weekly.   -Recheck Monday   14: Elevated bilirubin: follow-up with PCP outpatient.    15: Vitamin D deficiency: continue weekly supplementation for 8 weeks  16. Lightheadedness: decrease oxycodone to 2.5mg  q8H prn  17. Headache: called nursing to bring tylenol for him.  18. Constipation   -5/4 Sorbitol ordered  5/5 BM yesterday, improved  19. Left hip pain: voltaren gel scheduled  >50 minutes  spent in review of chart, examination of patient, discussion of labs, discussion of left hip pain with OT and scheduling voltaren gel, discussed improved control of blood sugars and provided list of foods for diabetics    LOS: 6 days A FACE TO FACE EVALUATION WAS PERFORMED  Clint Bolder P [place]heton Viramontes 02/26/2023, 9:54 AM

## 2023-02-26 NOTE — Progress Notes (Signed)
Physical Therapy Session Note  Patient Details  Name: [name] MRN: [id number] Date of Birth: 1951-08-26  Today's Date: 02/26/2023 PT Individual Time: 1430-1525 PT Individual Time Calculation (min): 55 min   Short Term Goals: Week 1:  PT Short Term Goal 1 (Week 1): Pt will complete bed mobility with modA PT Short Term Goal 2 (Week 1): Pt will complete bed<>chair transfers with modA and LRAD PT Short Term Goal 3 (Week 1): Pt will ambulate 29ft with modA and LRAD PT Short Term Goal 4 (Week 1): Pt will initiate stair training  Skilled Therapeutic Interventions/Progress Updates:      Pt in bed to start - agreeable to therapy session. Reports need to void prior to leaving room. Functional mobility completed as outlined below. Used Stedy to transfer patient to toilet in bathroom for urgency and time. Pt continent of bladder void. Assist needed for controlled lowering and standing from [facility] to [place].   Transported to main rehab gym for time to focus remainder of session on gait training with RW, blocked practice (x6) stand pivot transfers with RW, and side stepping. All completed w/ below described assist levels and cues. At best, patient completed a stand pivot transfer with CGA!   Returned to his room at end of session, assist for returning to bed with alarm on and all needs met.    Therapy Documentation Precautions:  Precautions Precautions: Fall Precaution Comments: L hip fx managed non-op, L hemi Restrictions Weight Bearing Restrictions: Yes LLE Weight Bearing: Weight bearing as tolerated  Mobility: Bed Mobility Bed Mobility: Supine to Sit;Sit to Supine Supine to Sit: Maximal Assistance - Patient - Patient 25-49% Sit to Supine: Moderate Assistance - Patient 50-74% Transfers Transfers: Sit to Stand;Stand to Sit;Transfer via Lift Equipment;Stand Pivot Transfers Sit to Stand: Moderate Assistance - Patient 50-74% Stand to Sit: Moderate Assistance - Patient 50-74% Stand Pivot  Transfers: Minimal Assistance - Patient > 75% Stand Pivot Transfer Details: Tactile cues for sequencing;Tactile cues for initiation;Tactile cues for weight shifting;Tactile cues for posture;Verbal cues for safe use of DME/AE;Verbal cues for gait pattern;Verbal cues for precautions/safety;Verbal cues for technique;Verbal cues for sequencing;Manual facilitation for weight shifting;Visual cues for safe use of DME/AE;Tactile cues for weight beaing Transfer (Assistive device): Rolling walker Transfer via Lift Equipment: Stedy Locomotion : Gait Ambulation: Yes Gait Assistance: Minimal Assistance - Patient > 75%;Moderate Assistance - Patient 50-74% Gait Distance (Feet): 62 Feet Assistive device: Rolling walker Gait Assistance Details: Tactile cues for initiation;Tactile cues for placement;Tactile cues for sequencing;Tactile cues for weight shifting;Tactile cues for posture;Verbal cues for technique;Verbal cues for precautions/safety;Verbal cues for sequencing;Verbal cues for gait pattern;Visual cues/gestures for sequencing;Manual facilitation for weight shifting;Verbal cues for safe use of DME/AE Gait Assistance Details: MinA to start for the first ~69ft, fading to modA for the last ~35ft. Assist required for advancing LLE to provide adequate step length. Antalgic gait with limited weight shifting to L and limited heel strike on L for initial contact, landing on forefoot. Gait Gait: Yes Gait Pattern: Impaired Gait Pattern: Step-to pattern;Decreased stance time - left;Decreased step length - left;Decreased step length - right;Decreased hip/knee flexion - left;Narrow base of support;Trunk flexed;Lateral trunk lean to left;Poor foot clearance - left;Left flexed knee in stance;Antalgic;Decreased weight shift to left;Decreased dorsiflexion - left High Level Ambulation High Level Ambulation: Side stepping Side Stepping: Along length of mat table, ~63ft + ~68ft Stairs / Additional Locomotion Stairs:  No Wheelchair Mobility Wheelchair Mobility: No  Trunk/Postural Assessment :      Therapy/Group:  Individual Therapy  Peyten Weare P Miroslav Gin  PT, DPT, CSRS  02/26/2023, 3:24 PM

## 2023-02-27 ENCOUNTER — Inpatient Hospital Stay (HOSPITAL_COMMUNITY): Payer: Medicare Other

## 2023-02-27 LAB — GLUCOSE, CAPILLARY
Glucose-Capillary: 160 mg/dL — ABNORMAL HIGH (ref 70–99)
Glucose-Capillary: 209 mg/dL — ABNORMAL HIGH (ref 70–99)
Glucose-Capillary: 228 mg/dL — ABNORMAL HIGH (ref 70–99)
Glucose-Capillary: 274 mg/dL — ABNORMAL HIGH (ref 70–99)

## 2023-02-27 MED ORDER — SENNA 8.6 MG PO TABS
1.0000 | ORAL_TABLET | Freq: Every day | ORAL | Status: DC
  Administered 2023-02-28: 8.6 mg via ORAL
  Filled 2023-02-27: qty 1

## 2023-02-27 NOTE — Progress Notes (Signed)
PROGRESS NOTE   Subjective/Complaints: No new complaints this morning Therapy notes reviewed- patient has been tolerating therapy but continues to complain of left hip pain  ROS: +insomnia, +lightheadedness, +headache, no N/V or abdominal pain, denies chest pain or shortness of breath, +left hip pain- continues despite scheduled voltaren gel   Objective:   No results found. Recent Labs    02/26/23 0614  WBC 7.1  HGB 14.3  HCT 40.8  PLT 428*   Recent Labs    02/26/23 0614  NA 134*  K 4.2  CL 97*  CO2 26  GLUCOSE 191*  BUN 23  CREATININE 0.83  CALCIUM 9.4    Intake/Output Summary (Last 24 hours) at 02/27/2023 0958 Last data filed at 02/27/2023 0819 Gross per 24 hour  Intake 600 ml  Output 700 ml  Net -100 ml        Physical Exam: Vital Signs Blood pressure 130/85, pulse 74, temperature 98.1 F (36.7 C), resp. rate 18, height 5\' 4"  (1.626 m), weight 64.4 kg, SpO2 93 %. Gen: no distress, normal appearing, BMI 24.37 HEENT: oral mucosa pink and moist, NCAT, rosacea on face, improved Cardiovascular:     Rate and Rhythm: Normal rate and regular rhythm.     Heart sounds: Normal heart sounds. No murmur heard.    No gallop.  Pulmonary:     Effort: pulmonary effort is normal. No respiratory distress.     Breath sounds: CTAB, no increase WOB Abdominal:     General: Bowel sounds are normal. There is no distension.     Palpations: Abdomen is soft.     Tenderness: There is no abdominal tenderness.     Comments: normoactive  BS Musculoskeletal:     Cervical back: Neck supple. No tenderness.     Comments: RUE 5-/5 in biceps, triceps, WE, grip and FA LUE- 4/5 in same muscles- FA 4-/5 RLE- 5-/5 in HF, KE, DF and PF LLE- HF 1 to 2-/5; DF 3-/5 and PF 3-/5- said too painful to do knee movement  Left hip TTP Skin:    General: Skin is warm and dry.     Comments: Peeling on face and head with cradle cap/psoriasis? On  head Balding Bruise on sternum from fall -did not visualize today Neurological:     Mental Status: Sleeping when I first came in but wakes to voice.  Follows commands     Comments: Decreased to light touch on L side LUE/LLE Slightly delayed responses Ox3 with cues  Psychiatric:     Comments: Flat, appropriate   Assessment/Plan: 1. Functional deficits which require 3+ hours per day of interdisciplinary therapy in a comprehensive inpatient rehab setting. Physiatrist is providing close team supervision and 24 hour management of active medical problems listed below. Physiatrist and rehab team continue to assess barriers to discharge/monitor patient progress toward functional and medical goals  Care Tool:  Bathing    Body parts bathed by patient: Right arm, Left arm, Chest, Abdomen, Front perineal area, Right upper leg, Left upper leg, Face   Body parts bathed by helper: Buttocks, Right lower leg, Left lower leg     Bathing assist Assist Level: Moderate Assistance - Patient  50 - 74%     Upper Body Dressing/Undressing Upper body dressing   What is the patient wearing?: Pull over shirt    Upper body assist Assist Level: Supervision/Verbal cueing    Lower Body Dressing/Undressing Lower body dressing      What is the patient wearing?: Pants, Incontinence brief     Lower body assist Assist for lower body dressing: Maximal Assistance - Patient 25 - 49%     Toileting Toileting    Toileting assist Assist for toileting: Maximal Assistance - Patient 25 - 49%     Transfers Chair/bed transfer  Transfers assist     Chair/bed transfer assist level: Maximal Assistance - Patient 25 - 49%     Locomotion Ambulation   Ambulation assist      Assist level: Moderate Assistance - Patient 50 - 74% Assistive device: Walker-rolling Max distance: 12   Walk 10 feet activity   Assist  Walk 10 feet activity did not occur: Safety/medical concerns (fatigue and L hip  pain)  Assist level: Moderate Assistance - Patient - 50 - 74% Assistive device: Walker-rolling   Walk 50 feet activity   Assist Walk 50 feet with 2 turns activity did not occur: Safety/medical concerns  Assist level: Moderate Assistance - Patient - 50 - 74% Assistive device: Walker-Eva    Walk 150 feet activity   Assist Walk 150 feet activity did not occur: Safety/medical concerns         Walk 10 feet on uneven surface  activity   Assist Walk 10 feet on uneven surfaces activity did not occur: Safety/medical concerns         Wheelchair     Assist Is the patient using a wheelchair?: Yes Type of Wheelchair: Manual    Wheelchair assist level: Moderate Assistance - Patient 50 - 74% Max wheelchair distance: 50    Wheelchair 50 feet with 2 turns activity    Assist        Assist Level: Moderate Assistance - Patient 50 - 74%   Wheelchair 150 feet activity     Assist      Assist Level: Dependent - Patient 0%   Blood pressure 130/85, pulse 74, temperature 98.1 F (36.7 C), resp. rate 18, height 5\' 4"  (1.626 m), weight 64.4 kg, SpO2 93 %.    Medical Problem List and Plan: 1. 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  Antithrombotics: -DVT/anticoagulation:  Pharmaceutical: Lovenox             -antiplatelet therapy: Aspirin and Plavix for three weeks followed by Plavix alone   3. Pain Management: Tylenol, oxycodone as needed- pt doesn't want "pain meds' but willing to try Tylenol- pain 8-9/10- but explained cannot do BC powders  -5/4 discussed trying pain meds before therapy help with activity tolerance  -5/5 will increase oxycodone frequency form Q8h to Q6h PRN, continue 2.5mg  dose    4. Mood/Behavior/Sleep: LCSW to evaluate and provide emotional support              -antipsychotic agents: n/a   5. Neuropsych/cognition: This patient is capable of making decisions on his own behalf.   6. Skin/Wound Care: Routine skin care checks   7. Fluids/Electrolytes/Nutrition: Routine Is and  Os and follow-up chemistries   8: Hypertension: monitor TID and prn (home: Hyzaar 100/25 daily, verapamil 240 mg daily). Add magnesium gluconate 250mg  HS  -5/5 one elevated reading overall controlled, continue current regimen     02/27/2023    6:16 AM 02/26/2023    7:39 PM 02/26/2023    1:45 PM  Vitals with BMI  Systolic 130 119 811  Diastolic 85 79 71  Pulse 74 87 81    9: Hyperlipidemia: continue statin   10: Peripheral neuropathy: continue gabapentin 600 mg q HS, discuss Qutenza   11:Left greater trochanter fracture: non-operative             -WBAT with a walker             -follow-up with Dr. Joice Lofts  -kpad ordered   12: DM-2: CBGs QID, carb modified diet; home: (Levemir 50 units BID; Victoza 1.8 mg daily, metformin 1000 mg BID) A1c 13.1%             -continue SSI             -increase Novolog to 6 units with meals             -continue Levemir 30 units in AM, increase Levemir to 26 units at bedtime             -suggest bringing Victoza in from home.   -provided list of foods for diabetics  -placed order for no drinks but water, coffee, and tea  Increase Levemir to 31U  CBG (last 3)  Recent Labs    02/26/23 1613 02/26/23 2114 02/27/23 0706  GLUCAP 246* 301* 160*     13: Hyponatremia:monitor weekly.   -Recheck Monday   14: Elevated bilirubin: follow-up with PCP outpatient.    15: Vitamin D deficiency: continue weekly supplementation for 8 weeks  16. Lightheadedness: decrease oxycodone to 2.5mg  q8H prn  17. Headache: called nursing to bring tylenol for him.  18. Constipation: chart reviewed and he had large type 4 stool yesterday, change senna to HS  19. Left hip pain: voltaren gel scheduled. XR ordered.      LOS: 7 days A FACE TO FACE  EVALUATION WAS PERFORMED  Drema Pry Wretha Laris 02/27/2023, 9:58 AM

## 2023-02-27 NOTE — Progress Notes (Signed)
Occupational Therapy Session Note  Patient Details  Name: [name] MRN: [id number] Date of Birth: April 26, 1951  Today's Date: 02/27/2023 OT Individual Time: 4098-1191 & 1430-1508 OT Individual Time Calculation (min): 25 min & 38 min OT missed time: 22 min Missed time reason: x-ray   Short Term Goals: Week 1:  OT Short Term Goal 1 (Week 1): Patient will complete toilet trasnfer with mod assist OT Short Term Goal 2 (Week 1): Ptient will dress lower body with mod assist OT Short Term Goal 3 (Week 1): Patient will don pull over shirt with min assist OT Short Term Goal 4 (Week 1): Patient will transition to standing with min assist with use of grab bar or UE support OT Short Term Goal 5 (Week 1): Patient will utilize LUE to aide with bathing and dressing with min cueing  Skilled Therapeutic Interventions/Progress Updates:  Session 1 Skilled OT intervention completed with focus on functional transfers and LLR ROM/strengthening. Pt received seated in w/c, agreeable to session. 5/10 pain reported in L hip; pre-medicated. OT offered rest breaks, repositioning throughout for pain reduction.  Pt declined self-care needs, requesting to work on "the L leg." Transported pt dependently in w/c <> gym for energy conservation. Cues needed for scooting forward and anterior weight shift in prep for mod A sit > stand using RW, then min A stand pivot with min cues for step sequence and min A for manual placement of LLE when going forward vs backing up.   At [facility], pt completed the following LLE exercise to increase ROM/strength to promote functional gait and ambulation needed for home level mobility: -heel slides/knee extension x10 -dorsiflexion x10 -heel raises x10  CGA sit > stand from EOM with RW, then min A stand pivot to w/c but with improved physical placement of LLE with cues and no physical assist this time.  Pt remained seated in w/c, with belt alarm on/activated, and with all needs in reach at  end of session.  Session 2 On first attempt to see pt, nursing indicated pt was out of room for x-rays. OT revisited pt at later time, with pt back in room and agreeable to session, however missed 22 mins of OT intervention as a result; will make up missed time as able.  Skilled OT intervention completed with focus on ambulatory endurance, LLE activation and balance in prep for home level mobility at DC. Pt received semi-upright in bed, agreeable to session. 5/10 pain reported in L hip; declined pain meds, however by end of session, was agreeable therefore nurse notified. OT offered rest breaks, repositioning and ice for pain reduction.  Pt transitioned to EOB with mod A for bringing LLE to EOB and for trunk elevation with HOB elevated. Donned B shoes with total A in prep for gait. CGA sit > stand using RW from slightly elevated bed then CGA stand pivot to w/c. Transported dependently into hallway.   Min A sit > stand using RW, then pt ambulated 93 ft with min A using RW, and with w/c brought behind for fatigue however did not use. Min cues needed for weight shifting > RLE as well as min A for balance, then constant cues for swinging L leg through during gait. Pt benefited from consistent cues, as when therapist backed off from cues, pt would lag L leg behind him, waiting for OT to manually facilitate. OT only manually facilitated LLE during L turn when back in room to EOB due to fatigue.   Pt became  tearful once back in room. OT asked if pain was the culprit but pt verbalized challenges with adjusting to how hard it is to walk now after everything has happened. Emotional support offered. Max A bed mobility to semi-upright in bed.  Pt remained semi-upright in bed, with bed alarm on/activated, and with all needs in reach at end of session.   Therapy Documentation Precautions:  Precautions Precautions: Fall Precaution Comments: L hip fx managed non-op, L hemi Restrictions Weight Bearing  Restrictions: Yes LLE Weight Bearing: Weight bearing as tolerated    Therapy/Group: Individual Therapy  Melvyn Novas, MS, OTR/L  02/27/2023, 3:47 PM

## 2023-02-27 NOTE — Progress Notes (Signed)
Physical Therapy Session Note  Patient Details  Name: [name] MRN: [id number] Date of Birth: [birth year]  Today's Date: 02/27/2023 PT Individual Time: 0730-0828 PT Individual Time Calculation (min): 58 min   Short Term Goals: Week 1:  PT Short Term Goal 1 (Week 1): Pt will complete bed mobility with modA PT Short Term Goal 2 (Week 1): Pt will complete bed<>chair transfers with modA and LRAD PT Short Term Goal 3 (Week 1): Pt will ambulate 82ft with modA and LRAD PT Short Term Goal 4 (Week 1): Pt will initiate stair training  Skilled Therapeutic Interventions/Progress Updates:      Pt in bed finishing up breakfast to start - agreeable to therapy session. Pt reports spillage of urinal, resulting in wet linen/pants/brief. Assisted with cleansing and changing brief/pants with totalA for time. Functional mobility training completed as outlined below. Pt reporting L hip pain with mobility - LPN made aware who provided pain Rx during treatment.   2x5 repeated sit<>stands from mat table to RW with supervision and cues for hand placement and upright posture at end of standing to get hips forward and shoulders back.  Blocked practice (x5) stand step pivot transfers with RW and CGA from mat table to/from wheelchair. Cues for lifting LLE for side stepping, stepping backwards, and hand placement during transitions.   Returned to his room and ended session seated in w/c with 1/2 lap tray supporting LUE, call bell in reach, safety belt alarm on, and NT changing bed linen.   Therapy Documentation Precautions:  Precautions Precautions: Fall Precaution Comments: L hip fx managed non-op, L hemi Restrictions Weight Bearing Restrictions: Yes LLE Weight Bearing: Weight bearing as tolerated General:   Mobility: Bed Mobility Bed Mobility: Supine to Sit Supine to Sit: Maximal Assistance - Patient - Patient 25-49% (with bed features) Transfers Transfers: Sit to Stand;Stand to Sit;Stand Pivot  Transfers Sit to Stand: Minimal Assistance - Patient > 75% Stand to Sit: Minimal Assistance - Patient > 75% Stand Pivot Transfers: Contact Guard/Touching assist;Minimal Assistance - Patient > 75% Stand Pivot Transfer Details: Tactile cues for sequencing;Tactile cues for initiation;Tactile cues for weight shifting;Tactile cues for posture;Verbal cues for safe use of DME/AE;Verbal cues for gait pattern;Verbal cues for precautions/safety;Verbal cues for technique;Verbal cues for sequencing;Manual facilitation for weight shifting;Visual cues for safe use of DME/AE;Tactile cues for weight beaing   Therapy/Group: Individual Therapy  Catie Chiao P Eimy Plaza  PT, DPT, CSRS  02/27/2023, 7:56 AM

## 2023-02-27 NOTE — Progress Notes (Signed)
Physical Therapy Session Note  Patient Details  Name: [name] MRN: [id number] Date of Birth: 17-Jul-1951  Today's Date: 02/27/2023 PT Individual Time: 1030-1115 PT Individual Time Calculation (min): 45 min   Short Term Goals: Week 1:  PT Short Term Goal 1 (Week 1): Pt will complete bed mobility with modA PT Short Term Goal 2 (Week 1): Pt will complete bed<>chair transfers with modA and LRAD PT Short Term Goal 3 (Week 1): Pt will ambulate 17ft with modA and LRAD PT Short Term Goal 4 (Week 1): Pt will initiate stair training  Skilled Therapeutic Interventions/Progress Updates:      Pt sleeping in wheelchair on arrival - awakens to voice and agreeable to therapy session. Transported to main rehab gym for time.   Pt requesting to work on ambulation. Sit<>stand to RW with CGA. Ambulates ~13ft + ~38ft with CGA/minA and RW - primary gait deficits include decreased heel strike on L, antalgic gait with decreased weight shifting to L, poor foot clearance and "dragging" foot through swing, step-to gait pattern. Cues provided for gait corrections and minA needed towards end of gait for stability due to fatigue.  Assisted on Nustep with minA stand pivot transfer using RW. SetupA needed due to LLE weakness. Completed 10.5 minutes at L5 resistance - encouraged keeping cadence >15 steps/minute but this was difficult for him. Also encouraged full ROM bilaterally to target range of motion for his L hip.   Returned to his room and patient requesting to lie down in bed due to fatigue. Ambulatory transfer with minA and RW in his room - safety cues for positioning and approaching the bed. MinA needed for sit>supine for LLE management. Alarm on, all needs met at end. He missed 15 minutes of therapy due to fatigue.   Therapy Documentation Precautions:  Precautions Precautions: Fall Precaution Comments: L hip fx managed non-op, L hemi Restrictions Weight Bearing Restrictions: Yes LLE Weight Bearing:  Weight bearing as tolerated General:     Therapy/Group: Individual Therapy  [name] P Terence Bart 02/27/2023, 7:03 AM

## 2023-02-28 DIAGNOSIS — F4322 Adjustment disorder with anxiety: Secondary | ICD-10-CM

## 2023-02-28 LAB — GLUCOSE, CAPILLARY
Glucose-Capillary: 198 mg/dL — ABNORMAL HIGH (ref 70–99)
Glucose-Capillary: 142 mg/dL — ABNORMAL HIGH (ref 70–99)
Glucose-Capillary: 202 mg/dL — ABNORMAL HIGH (ref 70–99)
Glucose-Capillary: 294 mg/dL — ABNORMAL HIGH (ref 70–99)

## 2023-02-28 MED ORDER — PSYLLIUM 95 % PO PACK
1.0000 | PACK | Freq: Every day | ORAL | Status: DC
  Administered 2023-02-28 – 2023-03-04 (×5): 1 via ORAL
  Filled 2023-02-28 (×9): qty 1

## 2023-02-28 MED ORDER — INSULIN DETEMIR 100 UNIT/ML ~~LOC~~ SOLN
32.0000 [IU] | Freq: Every day | SUBCUTANEOUS | Status: DC
  Administered 2023-02-28: 32 [IU] via SUBCUTANEOUS
  Filled 2023-02-28 (×2): qty 0.32

## 2023-02-28 NOTE — Patient Care Conference (Signed)
Inpatient RehabilitationTeam Conference and Plan of Care Update Date: 02/28/2023   Time: 11:09 AM    Patient Name: [name]      Medical Record Number: [id number]  Date of Birth: March 29, 1951 Sex: Male         Room/Bed: 4M01C/4M01C-01 Payor Info: Payor: MEDICARE / Plan: MEDICARE PART A / Product Type: *No Product type* /    Admit Date/Time:  02/20/2023 12:07 PM  Primary Diagnosis:  Acute arterial ischemic stroke, vertebrobasilar, brainstem, right [facility])  Hospital Problems: Principal Problem:   Acute arterial ischemic stroke, vertebrobasilar, brainstem, right (HCC) Active Problems:   Diabetic peripheral neuropathy associated with type 2 diabetes mellitus (HCC)   Nondisplaced fracture of greater trochanter of left femur, initial encounter for closed fracture [facility])    Expected Discharge Date: Expected Discharge Date: 03/08/23  Team Members Present: Physician leading conference: Dr. Sula Soda Social Worker Present: Dossie Der, LCSW Nurse Present: Chana Bode, RN PT Present: Raechel Chute, PT OT Present: Bretta Bang, OT SLP Present: Other (comment) Fae Pippin, SLP) PPS Coordinator present : Fae Pippin, SLP     Current Status/Progress Goal Weekly Team Focus  Bowel/Bladder   Continent of B/B - LBM: 02/25/23   remain cont B/B   Toileting Qshift and PRN    Swallow/Nutrition/ Hydration               ADL's   Set up A UB, Min A LB, Min/mod A toileting   Supervision-min A; need to upgrade min A goals   ADL retraining, ambulatory endurance, LLE strength, balance, DC planning    Mobility   Have been working a lot on functional transfers (stand step pivot with RW - at best CGA, at worst minA). modA bed mobility, minA sit<>stand transfers, minA stand pivot transfers, minA ambulating up to 73ft with RW (can ambulate further but modA needed due to LLE weakness and hip fx pain). Primarily limited by LLE weakness, L hip pain, generalized weakness/deconditioning. Pt  reports his wife is leaving rehab at [facility] on Thursday? Not sure what the family plan is for caring for both parents. Pt appears depressed at times, flat affect, emotional/crying.   CGA  bed mobility, functional transfers, gait training, DC planning, family ed    Communication                Safety/Cognition/ Behavioral Observations               Pain   Pt rates pain 0/10   Remain free of pain   assess pain qshift and PRN    Skin   Skin intact   Pt skin remain intact  assess skin qshift and PRN      Discharge Planning:  HOme with daughter and grandson assisting, daughter also assisting with Mom=pt's wife who is currently in [place] in rehab due to colon cancer. Daughter works 5-9 pm. Aware pt will need someone with him 24/7. Try to schedul family education for early next week.   Team Discussion: Patient post hip fracture and CVA; with left hip pain, weakness, fear of falling, depression and emotional lability. CBGs variable, MD adjusting medications. Rosacea addressed.  Patient on target to meet rehab goals: yes, currently needs min assist for lower body care and min - mod assist for toileting. Needs min assist to ambulate up to 25' using a rolling walker and mod assist for greater distances.  Goals for discharge set for supervision - min assist,  *See Care Plan and progress  notes for long and short-term goals.   Revisions to Treatment Plan:  X-ray hip Discuss Qutenza OP treatment for neuropathy Neuro psychology consult   Teaching Needs: Safety, medications, dietary modifications, transfers, toileting, etc.   Current Barriers to Discharge: Decreased caregiver support  Possible Resolutions to Barriers: Family education     Medical Summary Current Status: peripheral neuropathy, uncontrolled type 2 diabetes, hip fracture with pain, rosacea  Barriers to Discharge: Medical stability;Uncontrolled Diabetes  Barriers to Discharge Comments: peripheral neuropathy,  uncontrolled type 2 diabetes, hip fracture with pain, rosacea Possible Resolutions to [employer] Focus: scheduled for outpatient Qutenza, increase Levemir to 32U, ordered hip XR which shows routine healing, continue voltaren gel scheduled QID, continue Eucerin BID   Continued Need for Acute Rehabilitation Level of Care: The patient requires daily medical management by a physician with specialized training in physical medicine and rehabilitation for the following reasons: Direction of a multidisciplinary physical rehabilitation program to maximize functional independence : Yes Medical management of patient stability for increased activity during participation in an intensive rehabilitation regime.: Yes Analysis of laboratory values and/or radiology reports with any subsequent need for medication adjustment and/or medical intervention. : Yes   I attest that I was present, lead the team conference, and concur with the assessment and plan of the team.   Chana Bode B 02/28/2023, 3:50 PM

## 2023-02-28 NOTE — Progress Notes (Signed)
PROGRESS NOTE   Subjective/Complaints: No new complaints this morning Discussed increasing his insulin given his elevated CBGs Discussed his hip XR reslts  ROS: +insomnia, +lightheadedness, +headache, no N/V or abdominal pain, denies chest pain or shortness of breath, +left hip pain- continues despite scheduled voltaren gel, improved   Objective:   DG HIP UNILAT WITH PELVIS 2-3 VIEWS LEFT  Result Date: 02/27/2023 CLINICAL DATA:  Pain after fall EXAM: DG HIP (WITH OR WITHOUT PELVIS) 3V LEFT COMPARISON:  02/16/2023 FINDINGS: Osteopenia. Preserved joint spaces. Subtle deformity along the tip of the left hip greater trochanter. Subtle fracture. IMPRESSION: Stable subtle fracture along the superior aspect of the left greater trochanter. Fracture line is more lucent today. Underlying osteopenia. Electronically Signed   By: Karen Kays M.D.   On: 02/27/2023 14:34   Recent Labs    02/26/23 0614  WBC 7.1  HGB 14.3  HCT 40.8  PLT 428*   Recent Labs    02/26/23 0614  NA 134*  K 4.2  CL 97*  CO2 26  GLUCOSE 191*  BUN 23  CREATININE 0.83  CALCIUM 9.4    Intake/Output Summary (Last 24 hours) at 02/28/2023 1306 Last data filed at 02/28/2023 1249 Gross per 24 hour  Intake 480 ml  Output 475 ml  Net 5 ml        Physical Exam: Vital Signs Blood pressure 127/70, pulse 79, temperature 98 F (36.7 C), resp. rate 16, height 5\' 4"  (1.626 m), weight 64.6 kg, SpO2 94 %. Gen: no distress, normal appearing, BMI 24.37 HEENT: oral mucosa pink and moist, NCAT, rosacea on face, improved Cardiovascular:     Rate and Rhythm: Normal rate and regular rhythm.     Heart sounds: Normal heart sounds. No murmur heard.    No gallop.  Pulmonary:     Effort: pulmonary effort is normal. No respiratory distress.     Breath sounds: CTAB, no increase WOB Abdominal:     General: Bowel sounds are normal. There is no distension.     Palpations: Abdomen  is soft.     Tenderness: There is no abdominal tenderness.     Comments: normoactive  BS Musculoskeletal:     Cervical back: Neck supple. No tenderness.     Comments: RUE 5-/5 in biceps, triceps, WE, grip and FA LUE- 4/5 in same muscles- FA 4-/5 RLE- 5-/5 in HF, KE, DF and PF LLE- HF 1 to 2-/5; DF 3-/5 and PF 3-/5- said too painful to do knee movement  Left hip TTP Skin:    General: Skin is warm and dry.     Comments: Peeling on face and head with cradle cap/psoriasis? On head Balding Bruise on sternum from fall -did not visualize today Neurological:     Mental Status: Sleeping when I first came in but wakes to voice.  Follows commands     Comments: Decreased to light touch on L side LUE/LLE Slightly delayed responses Ox3 with cues  Ambulating with MinA Psychiatric:     Comments: Flat, appropriate   Assessment/Plan: 1. Functional deficits which require 3+ hours per day of interdisciplinary therapy in a comprehensive inpatient rehab setting. Physiatrist is providing close team  supervision and 24 hour management of active medical problems listed below. Physiatrist and rehab team continue to assess barriers to discharge/monitor patient progress toward functional and medical goals  Care Tool:  Bathing    Body parts bathed by patient: Right arm, Left arm, Chest, Abdomen, Front perineal area, Right upper leg, Left upper leg, Face, Buttocks, Left lower leg, Right lower leg   Body parts bathed by helper: Buttocks, Right lower leg, Left lower leg     Bathing assist Assist Level: Minimal Assistance - Patient > 75%     Upper Body Dressing/Undressing Upper body dressing   What is the patient wearing?: Pull over shirt    Upper body assist Assist Level: Set up assist    Lower Body Dressing/Undressing Lower body dressing      What is the patient wearing?: Pants, Incontinence brief     Lower body assist Assist for lower body dressing: Moderate Assistance - Patient 50 - 74%      Toileting Toileting    Toileting assist Assist for toileting: Moderate Assistance - Patient 50 - 74%     Transfers Chair/bed transfer  Transfers assist     Chair/bed transfer assist level: Minimal Assistance - Patient > 75%     Locomotion Ambulation   Ambulation assist      Assist level: Minimal Assistance - Patient > 75% Assistive device: Walker-rolling Max distance: 93 ft   Walk 10 feet activity   Assist  Walk 10 feet activity did not occur: Safety/medical concerns (fatigue and L hip pain)  Assist level: Minimal Assistance - Patient > 75% Assistive device: Walker-rolling   Walk 50 feet activity   Assist Walk 50 feet with 2 turns activity did not occur: Safety/medical concerns  Assist level: Moderate Assistance - Patient - 50 - 74% Assistive device: Walker-Eva    Walk 150 feet activity   Assist Walk 150 feet activity did not occur: Safety/medical concerns         Walk 10 feet on uneven surface  activity   Assist Walk 10 feet on uneven surfaces activity did not occur: Safety/medical concerns         Wheelchair     Assist Is the patient using a wheelchair?: Yes Type of Wheelchair: Manual    Wheelchair assist level: Moderate Assistance - Patient 50 - 74% Max wheelchair distance: 50    Wheelchair 50 feet with 2 turns activity    Assist        Assist Level: Moderate Assistance - Patient 50 - 74%   Wheelchair 150 feet activity     Assist      Assist Level: Dependent - Patient 0%   Blood pressure 127/70, pulse 79, temperature 98 F (36.7 C), resp. rate 16, height 5\' 4"  (1.626 m), weight 64.6 kg, SpO2 94 %.    Medical Problem List and Plan: 1. 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  Antithrombotics: -DVT/anticoagulation:  Pharmaceutical:  Lovenox             -antiplatelet therapy: Aspirin and Plavix for three weeks followed by Plavix alone   3. Pain Management: Tylenol, oxycodone as needed- pt doesn't want "pain meds' but willing to try Tylenol- pain 8-9/10- but explained cannot  do BC powders  -5/4 discussed trying pain meds before therapy help with activity tolerance  -5/5 will increase oxycodone frequency form Q8h to Q6h PRN, continue 2.5mg  dose    4. Mood/Behavior/Sleep: LCSW to evaluate and provide emotional support             -antipsychotic agents: n/a   5. Neuropsych/cognition: This patient is capable of making decisions on his own behalf.   6. Skin/Wound Care: Routine skin care checks   7. Fluids/Electrolytes/Nutrition: Routine Is and Os and follow-up chemistries   8: Hypertension: monitor TID and prn (home: Hyzaar 100/25 daily, verapamil 240 mg daily). Add magnesium gluconate 250mg  HS  -5/5 one elevated reading overall controlled, continue current regimen     02/28/2023    7:00 AM 02/28/2023    5:58 AM 02/27/2023    9:08 PM  Vitals with BMI  Weight 142 lbs 7 oz    BMI 24.43    Systolic  127 130  Diastolic  70 84  Pulse  79 85    9: Hyperlipidemia: continue statin   10: Peripheral neuropathy: continue gabapentin 600 mg q HS, discuss Qutenza   11:Left greater trochanter fracture: non-operative             -WBAT with a walker             -follow-up with Dr. Joice Lofts  -kpad ordered   12: DM-2: CBGs QID, carb modified diet; home: (Levemir 50 units BID; Victoza 1.8 mg daily, metformin 1000 mg BID) A1c 13.1%             -continue SSI             -increase Novolog to 6 units with meals             -continue Levemir 30 units in AM, increase Levemir to 26 units at bedtime             -suggest bringing Victoza in from home.   -provided list of foods for diabetics  -placed order for no drinks but water, coffee, and tea  Increase Levemir to 32U  CBG (last 3)  Recent Labs    02/27/23 2109 02/28/23 0724  02/28/23 1139  GLUCAP 274* 198* 142*     13: Hyponatremia:monitor weekly.   -Recheck Monday   14: Elevated bilirubin: follow-up with PCP outpatient.    15: Vitamin D deficiency: continue weekly supplementation for 8 weeks  16. Lightheadedness: decrease oxycodone to 2.5mg  q8H prn. Monitor BP TID  17. Headache: called nursing to bring tylenol for him.  18. Constipation: chart reviewed and he had large type 4 stool on 5/6, change senna to HS, add fiber supplement  19. Left hip pain: voltaren gel scheduled. XR ordered. Discussed that results are stable     LOS: 8 days A FACE TO FACE EVALUATION WAS PERFORMED  Clint Bolder P Byrne Capek 02/28/2023, 1:06 PM

## 2023-02-28 NOTE — Progress Notes (Signed)
Physical Therapy Session Note  Patient Details  Name: [name] MRN: [id number] Date of Birth: 28-Apr-1951  Today's Date: 02/28/2023 PT Individual Time: 1330-1441 PT Individual Time Calculation (min): 71 min   Short Term Goals: Week 1:  PT Short Term Goal 1 (Week 1): Pt will complete bed mobility with modA PT Short Term Goal 2 (Week 1): Pt will complete bed<>chair transfers with modA and LRAD PT Short Term Goal 3 (Week 1): Pt will ambulate 51ft with modA and LRAD PT Short Term Goal 4 (Week 1): Pt will initiate stair training   Skilled Therapeutic Interventions/Progress Updates:      Pt in bed to start - agreeable to therapy session. No reports of pain. Donned slip on shoes with totalA for time. Supine<>sitting EOB with minA for LLE management, relied heavily on bed rail to support trunk to upright. Stand step transfer with CGA (!) and RW from EOB to w/c, towards his stronger R side.   Transported to main rehab gym to initiate stair training. Patient reports having 3 STE without rails. Discussed options (ramp vs installing hand rails) or using RW for AD to navigate stairs. Provided demonstration for improved understanding - navigating stairs backwards while having 2nd person steady/brace RW while stepping up/down. Patient then completed x2, 6" steps with minA from PT and PT stabilizing RW. Pt reports his grandson may be able to install a ramp, but unsure.  In ortho rehab gym, practiced ambulated up/down ~45ft ramp with CGA (!) and RW. Cues for lifting LLE for sufficient step length and height.   Patient then practiced car transfers with car height simulating typical sedan. Required minA overall for entering and exiting the vehicle, primarily for LLE management.   Pt complete UE SciFit ergometer at wheelchair level for x5 minutes at L7 resistance - patient fatigued, falling asleep while doing. Offered to take patient outside to improve mood and therapeutic participation - pt agreeable.  Transported outside near [facility] for fresh air - patient appreciative - opening up about home, caring for his wife, etc. Discussed his progress, PT goals, PT POC, etc.   Returned upstairs to his room and completed ambulatory transfer with CGA and RW. Needed assist for removing his shoes and minA for returning to supine for LLE management. Alarm on and all needs met at the end of treatment.   Therapy Documentation Precautions:  Precautions Precautions: Fall Precaution Comments: L hip fx managed non-op, L hemi Restrictions Weight Bearing Restrictions: Yes LLE Weight Bearing: Weight bearing as tolerated General:     Therapy/Group: Individual Therapy  Jarvis Knodel P Abbigail Anstey  PT, DPT, CSRS  02/28/2023, 2:06 PM

## 2023-02-28 NOTE — Progress Notes (Signed)
Occupational Therapy Weekly Progress Note  Patient Details  Name: [name] MRN: [id number] Date of Birth: December 26, 1950  Beginning of progress report period: Feb 21, 2023 End of progress report period: Feb 28, 2023  Today's Date: 02/28/2023 OT Individual Time: 1000-1055 OT Individual Time Calculation (min): 55 min    Patient has met 5 of 5 short term goals.  Patient showing significant improvement in functional mobility and activity tolerance.    Patient continues to demonstrate the following deficits: muscle weakness, unbalanced muscle activation and decreased coordination, and decreased standing balance, hemiplegia, and decreased balance strategies and therefore will continue to benefit from skilled OT intervention to enhance overall performance with BADL and Reduce care partner burden.  Patient progressing toward long term goals..  Continue plan of care.  OT Short Term Goals Week 1:  OT Short Term Goal 1 (Week 1): Patient will complete toilet transfer with mod assist OT Short Term Goal 1 - Progress (Week 1): Met OT Short Term Goal 2 (Week 1): Ptient will dress lower body with mod assist OT Short Term Goal 2 - Progress (Week 1): Met OT Short Term Goal 3 (Week 1): Patient will don pull over shirt with min assist OT Short Term Goal 3 - Progress (Week 1): Met OT Short Term Goal 4 (Week 1): Patient will transition to standing with min assist with use of grab bar or UE support OT Short Term Goal 4 - Progress (Week 1): Met OT Short Term Goal 5 (Week 1): Patient will utilize LUE to aide with bathing and dressing with min cueing OT Short Term Goal 5 - Progress (Week 1): Met Week 2:  OT Short Term Goal 1 (Week 2): STG = LTG due to LOS  Skilled Therapeutic Interventions/Progress Updates:   Patient received seated in wheelchair following PT session.  Patient requesting to shower.  Ambulated to bathroom with min assist and cueing to step through with LLE.  Patient reports warm water feels good  on sore muscles.  Able to bathe himself while seated, needing assistance when standing as difficulty balancing and releasing grab bar to complete task.  Introduced Biochemist, clinical to aide with LB dressing/ LLE.  Patient returned to bed at end of session - tearful regarding fear of falling, and his wife's status.   Bed alarm set and call bell/ personal items in reach.    Therapy Documentation Precautions:  Precautions Precautions: Fall Precaution Comments: L hip fx managed non-op, L hemi Restrictions Weight Bearing Restrictions: Yes LLE Weight Bearing: Weight bearing as tolerated   Pain:  Denies pain initially - but requesting pain medication at end of session.  Nurse issued Tylenol.  Patient declined ice stating - it doesn't do anything."     Therapy/Group: Individual Therapy  Collier Salina 02/28/2023, 12:23 PM

## 2023-02-28 NOTE — Progress Notes (Signed)
Patient ID: [name], male   DOB: 1951-03-13, 72 y.o.   MRN: [id number]  Met with pt and left message or daughter-[name] to give team conference update regarding team conference progress this week. He is doing better and making progress and will need to schedule family education with his daughter this coming week. Pt has no equipment at home and will need to get recommendations from the therapy team. Will await return call from daughter regarding time to set up education and work on discharge needs for home.

## 2023-02-28 NOTE — Progress Notes (Signed)
Physical Therapy Session Note  Patient Details  Name: [name] MRN: [id number] Date of Birth: 21-Feb-1951  Today's Date: 02/28/2023 PT Individual Time: 1914-7829 PT Individual Time Calculation (min): 74 min   Short Term Goals: Week 1:  PT Short Term Goal 1 (Week 1): Pt will complete bed mobility with modA PT Short Term Goal 2 (Week 1): Pt will complete bed<>chair transfers with modA and LRAD PT Short Term Goal 3 (Week 1): Pt will ambulate 26ft with modA and LRAD PT Short Term Goal 4 (Week 1): Pt will initiate stair training  Skilled Therapeutic Interventions/Progress Updates: Patient supine in bed on entrance to room. Patient alert and agreeable to PT session.   Patient reported 2/10 pain on L hip, and it did not interfere with sleep. Patient requested to use urinal while EOB with supervision for safety at beginning of PT session.  Therapeutic Activity: Bed Mobility: Pt performed supine<>sit on EOB with modA and HHA for truncal elevation and to advance L LE off bed. Multimodal cues provided for patient to use R LE to advance L LE, but still required modA.  Transfers: Pt performed sit<>stand pivot transfers throughout session with modA to advance to next surface. Provided VC for patient to lean on contralateral LE to advance other extremity. Patient required increased time and effort to WB on L LE 2/2 increased pain, and increased time/effort to advance L LE in standing.  Gait Training:  Pt ambulated 22' x 1, 25' x 1, and 24' x 1 using RW with CGA. Patient presented with step to pattern and decreased step clearance, decreased lateral weight shifting and B foot flat (R>L). Patient given 1 step cue of focusing on increasing clearance on L LE, and to initiate step through pattern. Patient required max cues and increased time/effort to perform task, and required rest breaks 2/2 reports of incrased pain at L hip that would cause L knee to buckle.  NMR performed for improvements in motor  control and coordination, balance, sequencing, judgement, and self confidence/ efficacy in performing all aspects of mobility at highest level of independence.   Therapeutic Exercise: Pt performed the following exercises with therapist providing the described cuing and facilitation for improvement. - sit to stand on hi/low mat (18") x 8 reps. Increased time and effort to perform task required 2/2 increased reports of L hip pain. Patient required rest break after 8 reps (7/10). Multimodal cues provided for patient to scoot anteriorly and to place B LE in stand ready position. (Light minA/CGA provided)  Patient left in [facility] at end of session with brakes locked, and all needs within reach prior to OT session.       Therapy Documentation Precautions:  Precautions Precautions: Fall Precaution Comments: L hip fx managed non-op, L hemi Restrictions Weight Bearing Restrictions: Yes LLE Weight Bearing: Weight bearing as tolerated    Therapy/Group: Individual Therapy  Tara Rud PTA 02/28/2023, 12:28 PM

## 2023-03-01 LAB — GLUCOSE, CAPILLARY
Glucose-Capillary: 107 mg/dL — ABNORMAL HIGH (ref 70–99)
Glucose-Capillary: 257 mg/dL — ABNORMAL HIGH (ref 70–99)
Glucose-Capillary: 184 mg/dL — ABNORMAL HIGH (ref 70–99)
Glucose-Capillary: 158 mg/dL — ABNORMAL HIGH (ref 70–99)

## 2023-03-01 MED ORDER — SENNA 8.6 MG PO TABS
2.0000 | ORAL_TABLET | Freq: Every day | ORAL | Status: DC
  Administered 2023-03-01 – 2023-03-03 (×3): 17.2 mg via ORAL
  Filled 2023-03-01 (×4): qty 2

## 2023-03-01 MED ORDER — INSULIN DETEMIR 100 UNIT/ML ~~LOC~~ SOLN
33.0000 [IU] | Freq: Every day | SUBCUTANEOUS | Status: DC
  Administered 2023-03-01 – 2023-03-02 (×2): 33 [IU] via SUBCUTANEOUS
  Filled 2023-03-01 (×3): qty 0.33

## 2023-03-01 NOTE — Progress Notes (Signed)
Occupational Therapy Session Note  Patient Details  Name: [name] MRN: [id number] Date of Birth: [birth year]  Today's Date: 03/01/2023 OT Individual Time: 1330-1430 OT Individual Time Calculation (min): 60 min    Short Term Goals: Week 2:  OT Short Term Goal 1 (Week 2): STG = LTG due to LOS  Skilled Therapeutic Interventions/Progress Updates:    OT intervention on BUE strengthening and LUE functional. Supine>sit EOB with min A. Pt erquired assistance donning shoes. Stand pivot transfer to w/c with CGA using RW. 5 mins SciFit level 3. Pt sidestepped on/off SciVit with CGA and min verbal cues for sequencing. Table tasks with tan theraputty and small beads using LUE as dominant UE. Pt picked up small beads and implanted into theraputtyx2  Pt retrieved beads from theraputty using LUE. In hand manipulation tasks with coins. Pt completed tasks with delay and compensatory techniques. Pt returned to room and transferred back into bed. Sit>supine with supervision. Pt remained in bed with all needs within reach. Bed alarm actiavted.   Therapy Documentation Precautions:  Precautions Precautions: Fall Precaution Comments: L hip fx managed non-op, L hemi Restrictions Weight Bearing Restrictions: Yes LLE Weight Bearing: Weight bearing as tolerated Pain:  Pt denies pain this afternoon   Therapy/Group: Individual Therapy  Rich Brave 03/01/2023, 2:45 PM

## 2023-03-01 NOTE — Progress Notes (Signed)
PROGRESS NOTE   Subjective/Complaints: No new complaints this morning Discussed excellent progress with therapy Discussed elevated blood sugars, increasing Levemir dose  ROS: +insomnia, +lightheadedness, +headache- resolved, no N/V or abdominal pain, denies chest pain or shortness of breath, +left hip pain- continues despite scheduled voltaren gel, improved   Objective:   DG HIP UNILAT WITH PELVIS 2-3 VIEWS LEFT  Result Date: 02/27/2023 CLINICAL DATA:  Pain after fall EXAM: DG HIP (WITH OR WITHOUT PELVIS) 3V LEFT COMPARISON:  02/16/2023 FINDINGS: Osteopenia. Preserved joint spaces. Subtle deformity along the tip of the left hip greater trochanter. Subtle fracture. IMPRESSION: Stable subtle fracture along the superior aspect of the left greater trochanter. Fracture line is more lucent today. Underlying osteopenia. Electronically Signed   By: Karen Kays M.D.   On: 02/27/2023 14:34   No results for input(s): "WBC", "HGB", "HCT", "PLT" in the last 72 hours.  No results for input(s): "NA", "K", "CL", "CO2", "GLUCOSE", "BUN", "CREATININE", "CALCIUM" in the last 72 hours.   Intake/Output Summary (Last 24 hours) at 03/01/2023 1045 Last data filed at 03/01/2023 0837 Gross per 24 hour  Intake 480 ml  Output 1000 ml  Net -520 ml        Physical Exam: Vital Signs Blood pressure 121/77, pulse 80, temperature 98 F (36.7 C), resp. rate 16, height 5\' 4"  (1.626 m), weight 64.6 kg, SpO2 95 %. Gen: no distress, normal appearing, BMI 24.37 HEENT: oral mucosa pink and moist, NCAT, rosacea on face, improved Cardiovascular:     Rate and Rhythm: Normal rate and regular rhythm.     Heart sounds: Normal heart sounds. No murmur heard.    No gallop.  Pulmonary:     Effort: pulmonary effort is normal. No respiratory distress.     Breath sounds: CTAB, no increase WOB Abdominal:     General: Bowel sounds are normal. There is no distension.      Palpations: Abdomen is soft.     Tenderness: There is no abdominal tenderness.     Comments: normoactive  BS Musculoskeletal:     Cervical back: Neck supple. No tenderness.     Comments: RUE 5-/5 in biceps, triceps, WE, grip and FA LUE- 4/5 in same muscles- FA 4-/5 RLE- 5-/5 in HF, KE, DF and PF LLE- HF 1 to 2-/5; DF 3-/5 and PF 3-/5- said too painful to do knee movement  Left hip TTP, appropriately Skin:    General: Skin is warm and dry.     Comments: Peeling on face and head with cradle cap/psoriasis? On head Balding Bruise on sternum from fall -did not visualize today Neurological:     Mental Status: Sleeping when I first came in but wakes to voice.  Follows commands     Comments: Decreased to light touch on L side LUE/LLE Slightly delayed responses Ox3 with cues  Ambulating with MinA Psychiatric:     Comments: Flat, appropriate   Assessment/Plan: 1. Functional deficits which require 3+ hours per day of interdisciplinary therapy in a comprehensive inpatient rehab setting. Physiatrist is providing close team supervision and 24 hour management of active medical problems listed below. Physiatrist and rehab team continue to assess barriers to discharge/monitor  patient progress toward functional and medical goals  Care Tool:  Bathing    Body parts bathed by patient: Right arm, Left arm, Chest, Abdomen, Front perineal area, Right upper leg, Left upper leg, Face, Buttocks, Left lower leg, Right lower leg   Body parts bathed by helper: Buttocks, Right lower leg, Left lower leg     Bathing assist Assist Level: Minimal Assistance - Patient > 75%     Upper Body Dressing/Undressing Upper body dressing   What is the patient wearing?: Pull over shirt    Upper body assist Assist Level: Set up assist    Lower Body Dressing/Undressing Lower body dressing      What is the patient wearing?: Pants, Incontinence brief     Lower body assist Assist for lower body dressing:  Moderate Assistance - Patient 50 - 74%     Toileting Toileting    Toileting assist Assist for toileting: Moderate Assistance - Patient 50 - 74%     Transfers Chair/bed transfer  Transfers assist     Chair/bed transfer assist level: Minimal Assistance - Patient > 75%     Locomotion Ambulation   Ambulation assist      Assist level: Minimal Assistance - Patient > 75% Assistive device: Walker-rolling Max distance: 93 ft   Walk 10 feet activity   Assist  Walk 10 feet activity did not occur: Safety/medical concerns (fatigue and L hip pain)  Assist level: Minimal Assistance - Patient > 75% Assistive device: Walker-rolling   Walk 50 feet activity   Assist Walk 50 feet with 2 turns activity did not occur: Safety/medical concerns  Assist level: Moderate Assistance - Patient - 50 - 74% Assistive device: Walker-Eva    Walk 150 feet activity   Assist Walk 150 feet activity did not occur: Safety/medical concerns         Walk 10 feet on uneven surface  activity   Assist Walk 10 feet on uneven surfaces activity did not occur: Safety/medical concerns         Wheelchair     Assist Is the patient using a wheelchair?: Yes Type of Wheelchair: Manual    Wheelchair assist level: Moderate Assistance - Patient 50 - 74% Max wheelchair distance: 50    Wheelchair 50 feet with 2 turns activity    Assist        Assist Level: Moderate Assistance - Patient 50 - 74%   Wheelchair 150 feet activity     Assist      Assist Level: Dependent - Patient 0%   Blood pressure 121/77, pulse 80, temperature 98 F (36.7 C), resp. rate 16, height 5\' 4"  (1.626 m), weight 64.6 kg, SpO2 95 %.    Medical Problem List and Plan: 1. 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   Antithrombotics: -DVT/anticoagulation:  Pharmaceutical: Lovenox             -antiplatelet therapy: Aspirin and Plavix for three weeks followed by Plavix alone   3. Pain Management: Tylenol, oxycodone as needed- pt doesn't want "pain meds' but willing to try Tylenol- pain 8-9/10- but explained cannot do BC powders  -5/4 discussed trying pain meds before therapy help with activity tolerance  -5/5 will increase oxycodone frequency  form Q8h to Q6h PRN, continue 2.5mg  dose    4. Mood/Behavior/Sleep: LCSW to evaluate and provide emotional support             -antipsychotic agents: n/a   5. Neuropsych/cognition: This patient is capable of making decisions on his own behalf.   6. Skin/Wound Care: Routine skin care checks   7. Fluids/Electrolytes/Nutrition: Routine Is and Os and follow-up chemistries   8: Hypertension: monitor TID and prn (home: Hyzaar 100/25 daily, verapamil 240 mg daily). Add magnesium gluconate 250mg  HS  -5/5 one elevated reading overall controlled, continue current regimen     03/01/2023    5:00 AM 02/28/2023    1:22 PM 02/28/2023    7:00 AM  Vitals with BMI  Weight   142 lbs 7 oz  BMI   24.43  Systolic 121 128   Diastolic 77 77   Pulse 80 78     9: Hyperlipidemia: continue statin   10: Peripheral neuropathy: continue gabapentin 600 mg q HS, discuss Qutenza   11:Left greater trochanter fracture: non-operative             -WBAT with a walker             -follow-up with Dr. Joice Lofts  -kpad ordered   12: DM-2: CBGs QID, carb modified diet; home: (Levemir 50 units BID; Victoza 1.8 mg daily, metformin 1000 mg BID) A1c 13.1%             -continue SSI             -increase Novolog to 6 units with meals             -continue Levemir 30 units in AM, increase Levemir to 26 units at bedtime             -suggest bringing Victoza in from home.   -provided list of foods for diabetics  -placed order for no drinks but water, coffee, and tea  Increase Levemir to 33U  CBG (last 3)   Recent Labs    02/28/23 1642 02/28/23 2035 03/01/23 0605  GLUCAP 202* 294* 107*     13: Hyponatremia:monitor weekly.   -Recheck Monday   14: Elevated bilirubin: follow-up with PCP outpatient.    15: Vitamin D deficiency: continue weekly supplementation for 8 weeks  16. Lightheadedness: decrease oxycodone to 2.5mg  q8H prn. Monitor BP TID. Resolved.   17. Headache: called nursing to bring tylenol for him. Resolved  18. Constipation: chart reviewed and he had large type 4 stool on 5/6, change senna to HS and increase to 2 tabs, add fiber supplement  19. Left hip pain: voltaren gel scheduled. XR ordered. Discussed that results are stable, discussed that voltaren gel is helping, continue     LOS: 9 days A FACE TO FACE EVALUATION WAS PERFORMED  Mirenda Baltazar P Janan Bogie 03/01/2023, 10:45 AM

## 2023-03-01 NOTE — Progress Notes (Signed)
Physical Therapy Weekly Progress Note  Patient Details  Name: [name] MRN: [id number] Date of Birth: 1951/01/12  Beginning of progress report period: Feb 21, 2023 End of progress report period: Mar 01, 2023  Today's Date: 03/01/2023 PT Individual Time: 4098-1191 PT Individual Time Calculation (min): 27 min   Patient has met 4 of 4 short term goals.  [name] is making appropriate progress towards LTG of CGA. He currently requires min to modA for bed mobility, CGA for sit<>stand transfers with RW, CGA to minA for stand step transfers with RW, and is ambulating up to ~51ft with CGA and RW. Longer distance ambulation needs minA for safety due to LLE weakness and hip pain. He is motivated to return home and regain function. Continue skilled PT services to address impairments of L sided weakness, general deconditioning and weakness, functional transfers, bed mobility, and stair training.  Patient continues to demonstrate the following deficits muscle weakness and muscle joint tightness, decreased cardiorespiratoy endurance, unbalanced muscle activation, and decreased standing balance, hemiplegia, and decreased balance strategies and therefore will continue to benefit from skilled PT intervention to increase functional independence with mobility.  Patient progressing toward long term goals..  Continue plan of care.  PT Short Term Goals Week 1:  PT Short Term Goal 1 (Week 1): Pt will complete bed mobility with modA PT Short Term Goal 1 - Progress (Week 1): Met PT Short Term Goal 2 (Week 1): Pt will complete bed<>chair transfers with modA and LRAD PT Short Term Goal 2 - Progress (Week 1): Met PT Short Term Goal 3 (Week 1): Pt will ambulate 25ft with modA and LRAD PT Short Term Goal 3 - Progress (Week 1): Met PT Short Term Goal 4 (Week 1): Pt will initiate stair training Week 2:  PT Short Term Goal 1 (Week 2): STG = LTG  Skilled Therapeutic Interventions/Progress Updates:      Pt supine  in bed to start - agreeable to therapy session. Reports receiving pain medication from nursing earlier. Completed bed mobility with supervision (!!) with hospital bed features, able to manage LLE but uses UE to assist with mobility. Donned slip on shoes with totalA and then completed stand pivot transfer with RW and CGA for safety. Cues for stepping all the way back to wheelchair before sitting prematurely. Transported to main rehab gym for time and focused remainder of session on gait training. Ambulated ~48ft + ~3ft with CGA and RW. Gait antalgic with decreased stance time on LLE, min cues for adequate step lengths on L. Encouraged increasing gait speed and L heel strike as able. Returned to room and concluded session seated in w/c with safety belt alarm on, call bell in reach. Made aware of upcoming therapy session.   Therapy Documentation Precautions:  Precautions Precautions: Fall Precaution Comments: L hip fx managed non-op, L hemi Restrictions Weight Bearing Restrictions: Yes LLE Weight Bearing: Weight bearing as tolerated General:    Therapy/Group: Individual Therapy  Tank Difiore P Ceciley Buist  PT, DPT, CSRS  03/01/2023, 7:36 AM

## 2023-03-01 NOTE — Progress Notes (Signed)
Occupational Therapy Session Note  Patient Details  Name: [name] MRN: [id number] Date of Birth: 12-24-1950  Today's Date: 03/01/2023 OT Individual Time: 0930-1040 OT Individual Time Calculation (min): 70 min    Short Term Goals: Week 2:  OT Short Term Goal 1 (Week 2): STG = LTG due to LOS  Skilled Therapeutic Interventions/Progress Updates:    Pt seated in w/c upon arrival. Pt declined bathing/dressing this monring, "I've already done that." Transition to day room. Pt amb 28' with CGA and max verbal cues for advancing LLE. Pt reports he is "worn out" from earlier session. Pt supplied RW bag for use at d/c. 5 mins x 2 NuStep level 5 with avg SPM 20. LUE circuit with 3# bar bell reaching out and up. Standing tasks with focus on WB on LLE for strengthening. Min facilitation and mod verbal cues for upright posture. Pt returned to room and transferred to bed with squat pivot transers. Sit>supine with supervison. All need within reach and bed alarm activated.   Therapy Documentation Precautions:  Precautions Precautions: Fall Precaution Comments: L hip fx managed non-op, L hemi Restrictions Weight Bearing Restrictions: Yes LLE Weight Bearing: Weight bearing as tolerated Pain: Pt reports his pain is "ok": declined intervention   Therapy/Group: Individual Therapy  Rich Brave 03/01/2023, 10:47 AM

## 2023-03-01 NOTE — Progress Notes (Signed)
Physical Therapy Session Note  Patient Details  Name: [name] MRN: [id number] Date of Birth: 16-Jun-1951  Today's Date: 03/01/2023 PT Individual Time: 1117-1200 PT Individual Time Calculation (min): 43 min   Short Term Goals: Week 1:  PT Short Term Goal 1 (Week 1): Pt will complete bed mobility with modA PT Short Term Goal 1 - Progress (Week 1): Met PT Short Term Goal 2 (Week 1): Pt will complete bed<>chair transfers with modA and LRAD PT Short Term Goal 2 - Progress (Week 1): Met PT Short Term Goal 3 (Week 1): Pt will ambulate 2ft with modA and LRAD PT Short Term Goal 3 - Progress (Week 1): Met PT Short Term Goal 4 (Week 1): Pt will initiate stair training  Skilled Therapeutic Interventions/Progress Updates: Pt presents supine in bed and asleep.  Pt arouses quickly and agreeable to therapy.  Pt transfers sup to sit w/ mod A, verbal cues for RLE bridging to bring LLE to EOB.  Pt requires mod A to power trunk up.  Pt transfers sit to stand w/ CGA, but slowly.  Pt encouraged to use B UES for increased independence.  Pt amb w/ RW and CGA/supervision x 30' before requiring seated rest break.  Pt w/ reciprocal gait pattern x 85% of trial w/ cueing to initiate.  Pt amb x 45' w/ RW.  Pt requires seated rest break 2/2 pain.  Pt negotiated up/down ramp w/ CGA including turns to return to seat.  Pt amb x 25' w/ RW and close supervision back to bed.  Pt required cueing for safe turns.  Pt transferred sit to supine for LLE only, verbal cues for R knee flexion to assist w/ bridge onto bed.  Pt remained supine w/ bed alarm on and all needs in reach.     Therapy Documentation Precautions:  Precautions Precautions: Fall Precaution Comments: L hip fx managed non-op, L hemi Restrictions Weight Bearing Restrictions: Yes LLE Weight Bearing: Weight bearing as tolerated General:   Vital Signs:   Pain:8/10 Pain Assessment Pain Scale: 0-10 Pain Score: 5     Therapy/Group: Individual  Therapy  [name] 03/01/2023, 12:01 PM

## 2023-03-02 ENCOUNTER — Telehealth: Payer: Self-pay | Admitting: Physical Medicine and Rehabilitation

## 2023-03-02 DIAGNOSIS — F4322 Adjustment disorder with anxiety: Secondary | ICD-10-CM

## 2023-03-02 LAB — GLUCOSE, CAPILLARY
Glucose-Capillary: 116 mg/dL — ABNORMAL HIGH (ref 70–99)
Glucose-Capillary: 150 mg/dL — ABNORMAL HIGH (ref 70–99)
Glucose-Capillary: 177 mg/dL — ABNORMAL HIGH (ref 70–99)
Glucose-Capillary: 207 mg/dL — ABNORMAL HIGH (ref 70–99)

## 2023-03-02 MED ORDER — MAGNESIUM CITRATE PO SOLN
1.0000 | Freq: Once | ORAL | Status: DC
  Filled 2023-03-02: qty 296

## 2023-03-02 NOTE — Progress Notes (Signed)
Physical Therapy Session Note  Patient Details  Name: [name] MRN: [id number] Date of Birth: [birth year]  Today's Date: 03/02/2023 PT Individual Time: 4098-1191 + 1445-1525 PT Individual Time Calculation (min): 42 min  + 40 min  Short Term Goals: Week 2:  PT Short Term Goal 1 (Week 2): STG = LTG  Skilled Therapeutic Interventions/Progress Updates:      1st session: Pt in bed to start - awake and agreeable to therapy session. Patient with strong body odor - encouraged him to shower for self care. Pt agreeable.   Supine<>sitting EOB with supervision with ++ time to allow for increased functional independence. Hospital bed features used. minA needed for scooting to EOB once sitting upright to achieve feet flat.   Sit<>Stand to RW with supervision from EOB. Ambulated from EOB to bathroom with CGA and RW - antalgic gait with decreased heel strike on L, decreased weight bearing on L, and primarily a step-to gait pattern. Patient with urgency incontinence of bladder - charted. Removed dirty brief/pants with maxA and patient able to doff dirty shirt without assist.   Pt bathed himself while sitting in shower chair with mostly supervision - needed minA for washing back/buttock and lower legs. Similar assist for drying. CGA while sit<>standing in shower while pulling from grab bar and CGA for balance while washing/drying.  UB dressing with minA and LB dressing with maxA for clean clothes, totalA for brief and new/clean socks.   Gait training back to his wheelchair in his room with CGA and RW, ~29ft with similar deficit and cues as above.   Transported to ortho rehab gym in w/c. Completed ambulatory car transfer with minA and RW with car height replicating a typical sedan. MinA needed for LLE management only. Patient able to reposition self with ++ time.   Pt ended session seated in w/c - encouraged him to stay OOB for at least ~30 minutes and to call for nursing when ready - pt voiced  understanding. All needs met.    2nd session: Pt sleeping in bed on arrival - awakens to voice and needs encouragement to complete therapy session.  Supine<>sitting EOB with minA, primarily for trunk support to go from L side lying to upright. Pt reporting urge to have BM. Sit<>Stand to RW with stand by assist and patient ambulates with CGA and RW in his room to bathroom - cues for increasing step length on L and safety awareness as he rush's to the bathroom for urgency. Pt continent x2 - charted. Ambulated to his w/c in his room with CGA and RW, similar deficits as above.  Transported to day room rehab gym. Gait training around nurses station 171ft (!!) with CGA and RW - no seated or standing rest breaks. Cues only for increasing L step length and heel strike.   Kinetron at wheelchair level x6 minutes at L60cm/sec resistance - encouraged full ROM on L for each step.   Assisted back to bed at end of session via ambulatory transfer with RW and CGA. minA needed for sit>supine for LLE management. All needs met with alarm on at end of treatment.   Therapy Documentation Precautions:  Precautions Precautions: Fall Precaution Comments: L hip fx managed non-op, L hemi Restrictions Weight Bearing Restrictions: Yes LLE Weight Bearing: Weight bearing as tolerated General:     Therapy/Group: Individual Therapy  Zan Orlick P Kimberleigh Mehan  PT, DPT, CSRS  03/02/2023, 7:38 AM

## 2023-03-02 NOTE — Progress Notes (Signed)
PROGRESS NOTE   Subjective/Complaints: No new complaints this morning  Asks for a note stating that he is admitted in the hospital Discussed improved CBGs  ROS: +insomnia, +lightheadedness, +headache- resolved, no N/V or abdominal pain, denies chest pain or shortness of breath, +left hip pain- continues despite scheduled voltaren gel, improved/reslved   Objective:   No results found. No results for input(s): "WBC", "HGB", "HCT", "PLT" in the last 72 hours.  No results for input(s): "NA", "K", "CL", "CO2", "GLUCOSE", "BUN", "CREATININE", "CALCIUM" in the last 72 hours.   Intake/Output Summary (Last 24 hours) at 03/02/2023 1051 Last data filed at 03/02/2023 0800 Gross per 24 hour  Intake 600 ml  Output 600 ml  Net 0 ml        Physical Exam: Vital Signs Blood pressure 120/72, pulse 78, temperature 98 F (36.7 C), resp. rate 18, height 5\' 4"  (1.626 m), weight 64.6 kg, SpO2 95 %. Gen: no distress, normal appearing, BMI 24.37 HEENT: oral mucosa pink and moist, NCAT, rosacea on face, improved Cardiovascular:     Rate and Rhythm: Normal rate and regular rhythm.     Heart sounds: Normal heart sounds. No murmur heard.    No gallop.  Pulmonary:     Effort: pulmonary effort is normal. No respiratory distress.     Breath sounds: CTAB, no increase WOB Abdominal:     General: Bowel sounds are normal. There is no distension.     Palpations: Abdomen is soft.     Tenderness: There is no abdominal tenderness.     Comments: normoactive  BS Musculoskeletal:     Cervical back: Neck supple. No tenderness.     Comments: RUE 5-/5 in biceps, triceps, WE, grip and FA LUE- 4/5 in same muscles- FA 4-/5 RLE- 5-/5 in HF, KE, DF and PF LLE- HF 1 to 2-/5; DF 3-/5 and PF 3-/5- said too painful to do knee movement  Left hip TTP, appropriately- much improved Skin:    General: Skin is warm and dry.     Comments: Peeling on face and head with  cradle cap/psoriasis? On head Balding Bruise on sternum from fall -did not visualize today Neurological:     Mental Status: Sleeping when I first came in but wakes to voice.  Follows commands     Comments: Decreased to light touch on L side LUE/LLE Slightly delayed responses Ox3 with cues  Ambulating with MinA Psychiatric:     Comments: Flat, appropriate   Assessment/Plan: 1. Functional deficits which require 3+ hours per day of interdisciplinary therapy in a comprehensive inpatient rehab setting. Physiatrist is providing close team supervision and 24 hour management of active medical problems listed below. Physiatrist and rehab team continue to assess barriers to discharge/monitor patient progress toward functional and medical goals  Care Tool:  Bathing    Body parts bathed by patient: Right arm, Left arm, Chest, Abdomen, Front perineal area, Right upper leg, Left upper leg, Face, Buttocks, Left lower leg, Right lower leg   Body parts bathed by helper: Buttocks, Right lower leg, Left lower leg     Bathing assist Assist Level: Minimal Assistance - Patient > 75%     Upper  Body Dressing/Undressing Upper body dressing   What is the patient wearing?: Pull over shirt    Upper body assist Assist Level: Set up assist    Lower Body Dressing/Undressing Lower body dressing      What is the patient wearing?: Pants, Incontinence brief     Lower body assist Assist for lower body dressing: Moderate Assistance - Patient 50 - 74%     Toileting Toileting    Toileting assist Assist for toileting: Moderate Assistance - Patient 50 - 74%     Transfers Chair/bed transfer  Transfers assist     Chair/bed transfer assist level: Minimal Assistance - Patient > 75%     Locomotion Ambulation   Ambulation assist      Assist level: Contact Guard/Touching assist Assistive device: Walker-rolling Max distance: 15'   Walk 10 feet activity   Assist  Walk 10 feet activity did  not occur: Safety/medical concerns (fatigue and L hip pain)  Assist level: Contact Guard/Touching assist Assistive device: Walker-rolling   Walk 50 feet activity   Assist Walk 50 feet with 2 turns activity did not occur: Safety/medical concerns  Assist level: Moderate Assistance - Patient - 50 - 74% Assistive device: Walker-Eva    Walk 150 feet activity   Assist Walk 150 feet activity did not occur: Safety/medical concerns         Walk 10 feet on uneven surface  activity   Assist Walk 10 feet on uneven surfaces activity did not occur: Safety/medical concerns         Wheelchair     Assist Is the patient using a wheelchair?: Yes Type of Wheelchair: Manual    Wheelchair assist level: Moderate Assistance - Patient 50 - 74% Max wheelchair distance: 50    Wheelchair 50 feet with 2 turns activity    Assist        Assist Level: Moderate Assistance - Patient 50 - 74%   Wheelchair 150 feet activity     Assist      Assist Level: Dependent - Patient 0%   Blood pressure 120/72, pulse 78, temperature 98 F (36.7 C), resp. rate 18, height 5\' 4"  (1.626 m), weight 64.6 kg, SpO2 95 %.    Medical Problem List and Plan: 1. 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  Antithrombotics: -DVT/anticoagulation:  Pharmaceutical: Lovenox             -antiplatelet therapy: Aspirin and Plavix for three weeks followed by Plavix alone   3. Pain Management: Tylenol, oxycodone as needed- pt doesn't want "pain meds' but willing to try Tylenol- pain 8-9/10- but explained cannot do BC powders  -5/4 discussed trying pain meds before therapy help with activity tolerance  -5/5 will increase oxycodone frequency form Q8h to Q6h PRN, continue 2.5mg  dose    4. Mood/Behavior/Sleep: LCSW to evaluate and  provide emotional support             -antipsychotic agents: n/a   5. Neuropsych/cognition: This patient is capable of making decisions on his own behalf.   6. Skin/Wound Care: Routine skin care checks   7. Fluids/Electrolytes/Nutrition: Routine Is and Os and follow-up chemistries   8: Hypertension: monitor TID and prn (home: Hyzaar 100/25 daily, verapamil  240 mg daily). Add magnesium gluconate 250mg  HS  -5/5 one elevated reading overall controlled, continue current regimen     03/02/2023    5:19 AM 03/01/2023    8:01 PM 03/01/2023    1:38 PM  Vitals with BMI  Systolic 120 118 161  Diastolic 72 85 81  Pulse 78 88 85    9: Hyperlipidemia: continue statin   10: Peripheral neuropathy: continue gabapentin 600 mg q HS, discuss Qutenza   11:Left greater trochanter fracture: non-operative             -WBAT with a walker             -follow-up with Dr. Joice Lofts  -kpad ordered   12: DM-2: CBGs QID, carb modified diet; home: (Levemir 50 units BID; Victoza 1.8 mg daily, metformin 1000 mg BID) A1c 13.1%             -continue SSI             -increase Novolog to 6 units with meals             -continue Levemir 30 units in AM, increase Levemir to 26 units at bedtime             -suggest bringing Victoza in from home.   -provided list of foods for diabetics  -placed order for no drinks but water, coffee, and tea  Continue Levemir 33U  CBG (last 3)  Recent Labs    03/01/23 1617 03/01/23 2056 03/02/23 0658  GLUCAP 184* 158* 116*     13: Hyponatremia:monitor weekly.   -Recheck Monday   14: Elevated bilirubin: follow-up with PCP outpatient.    15: Vitamin D deficiency: continue weekly supplementation for 8 weeks  16. Lightheadedness: decrease oxycodone to 2.5mg  q8H prn. Monitor BP TID. Resolved.   17. Headache: called nursing to bring tylenol for him. Resolved  18. Constipation: chart reviewed and he had large type 4 stool on 5/6, change senna to HS and increase to 2 tabs, add  fiber supplement, magnesium citrate ordered 5/10  19. Left hip pain: voltaren gel scheduled. XR ordered. Discussed that results are stable, discussed that voltaren gel is helping, continue, much improved.      LOS: 10 days A FACE TO FACE EVALUATION WAS PERFORMED  Drema Pry Valencia Kassa 03/02/2023, 10:51 AM

## 2023-03-02 NOTE — Progress Notes (Signed)
Occupational Therapy Session Note  Patient Details  Name: [name] MRN: [id number] Date of Birth: Feb 21, 1951  Today's Date: 03/02/2023 OT Individual Time: 1017-1057 OT Individual Time Calculation (min): 40 min    Short Term Goals: Week 2:  OT Short Term Goal 1 (Week 2): STG = LTG due to LOS  Skilled Therapeutic Interventions/Progress Updates:    Patient agreeable to participate in OT session. Reports 8/10 pain level in left leg. Reports he was premedicated before therapy session. Monitored pain during session and limited tasks within pain tolerance.    Patient participated in skilled OT session focusing on LUE NM re-ed. Therapist facilitated session while pt focused on LUE  strength and stability  in order to improve shoulder and scapular stability required to complete UB bathing and dressing tasks that require more motor control and precision to complete.   LUE Strengthening:  - supine, shoulder chest press, flexion, horizontal abduction/adduction, abduction, 1lb wrist weight,  10X. - supine, proximal shoulder strengthening, up/down, criss/cross, circles (left/right), 10X each, rest breaks after each set.  - Rhythmic stabilization exercise: 1' shoulder flexed at 90 degrees  Pt continues to demonstrate decreased left shoulder and scapular weakness causing difficulty utilizing LUE for every day activities and will benefit from continued therapy focus and HEP to progress.    Therapy Documentation Precautions:  Precautions Precautions: Fall Precaution Comments: L hip fx managed non-op, L hemi Restrictions Weight Bearing Restrictions: Yes LLE Weight Bearing: Weight bearing as tolerated   Therapy/Group: Individual Therapy  Limmie Patricia, OTR/L,CBIS  Supplemental OT - [facility] and [facility] Secure Chat Preferred   03/02/2023, 6:56 AM

## 2023-03-02 NOTE — Progress Notes (Signed)
Occupational Therapy Session Note  Patient Details  Name: [name] MRN: [id number] Date of Birth: [birth year]  Today's Date: 03/02/2023 OT Individual Time: 6962-9528 OT Individual Time Calculation (min): 72 min    Short Term Goals: Week 2:  OT Short Term Goal 1 (Week 2): STG = LTG due to LOS  Skilled Therapeutic Interventions/Progress Updates:   Pt bed level upon OT arrival. OT noted pt's birthday this Sunday and offered to take pt outdoors as he appeared flat and somewhat upset when asked about his wife's condition. Pt open to all activity. No pain reported throughout activities including standing level and short distance amb with RW. Pt transported off unit via w/c for energy conservation. Once on flat walk way pt self propelled 20 ft x 4 trials with cues for use of L UE symmetrically if able with 75% accuracy and rest between intervals. Rested in shade and OT demonstrated integral scap AROM therex for shoulder shrugs and scap retraction for carryover between visits and home. Pt able to perform 4 sets of 5 reps of each with rests in between. Once back on unit, pt agreeable to kitchen counter standing as pt reports he loves to cook. Pt able to stand with CGA to reach for items on L side and cross midline and place on R side. 2 sets with seated rest. Pt then transported back to room doorway and amb back to bed with RW as per request prior to last PT session with CGA including sidestepping to [facility]. OT trained pt in light yellow simple tband tied to bed for biceps curls HEP rec 10 reps 3x per day with teach back. Pt left supine bed level with bed exit engaged, needs and nurse call button in reach.   Therapy Documentation Precautions:  Precautions Precautions: Fall Precaution Comments: L hip fx managed non-op, L hemi Restrictions Weight Bearing Restrictions: Yes LLE Weight Bearing: Weight bearing as tolerated    Therapy/Group: Individual Therapy  Vicenta Dunning 03/02/2023, 7:29 AM

## 2023-03-02 NOTE — Progress Notes (Signed)
Patient ID: [name], male   DOB: 09-15-1951, 72 y.o.   MRN: [id number]  Met with pt and gave him the letter he will need for traffic court. Daughter to pick up tomorrow. Discussed his health insurance does not cover his DME needs. May need to get on-line due to cheaper have left his daughter a message regarding this. Will await return call regarding this.

## 2023-03-02 NOTE — Telephone Encounter (Signed)
Note provided

## 2023-03-02 NOTE — Consult Note (Signed)
Neuropsychological Consultation Comprehensive Inpatient Rehab   Patient:   [name]   DOB:   04/14/1951  MR Number:  [id number]  Location:  [facility] [address] CENTER [id number] [place] [address] Dept: [phone] Loc: [phone]           Date of Service:   02/28/2023  Start Time:   3 PM End Time:   4 PM  Provider/Observer:  Arley Phenix, Psy.D.       Clinical Neuropsychologist       Billing Code/Service: [phone]  Reason for Service:    [name] is a 72 year old male referred for neuropsychological consultation during his ongoing inpatient comprehensive rehabilitation admission.  Patient recently presented to the emergency department at [facility] after a fall to ground when his left leg gave way.  Patient reportedly was walking in his home and developed bilateral lower extremity weakness and ultimately fell to the yard.  Patient experienced left hip pain.  CT of left hip with minimally displaced left greater trochanter fracture and determined to be addressed conservatively without surgery.  Patient did have a history of prior stroke and was on aspirin.  Patient had a follow-up MRI showing probable acute ischemic punctate infarct in the right cerebral peduncle.  Neurology was consulted and placed the patient on Plavix as well as aspirin.  Patient had essentially uncontrolled diabetic status, hypertension.  Patient was admitted to the inpatient comprehensive rehabilitation programs due to dysfunction secondary to stroke.  Patient acknowledged significant stressors outside of dealing with his most recent CVA.  This patient's wife has been diagnosed with colon cancer and is currently in a rehab facility in [place].  Patient reports that he is struggled with coping even before these events.  Patient was oriented but described anxious mood.  Receptive and expressive language  functions appear to be intact.  HPI for the current admission:    HPI: [name] is a 72 year old male who presented to the ED at [facility] who fell to ground after his left leg gave way. He was walking home and developed BLE weakness and fell in his yard. He complained of left hip pain. Workup revealed WBC 14.5, BP 153/91, HR 103. CT head and chest performed and without acute abnormality. CT of left hip with minimally displaced left greater trochanter fracture. Orthopedic surgery consulted and managed non-operatively. He is WBAT with a walker. He has a history of DM and BLE peripheral neuropathy. History of prior stroke on aspirin. MRI performed showed probable acute ischemic punctate infarct in the anteromedial aspect of the right cerebral peduncle, artifact could not be ruled out given that there is no associated FLAIR signal abnormality. Neurology consulted and placed on Plavix as well as aspirin. DAPT for 21 days followed by Plavix alone. A1c 13.1%. Diabetic coordinator consulted for uncontrolled diabetes. Hypertension history at home on Hyzaar and verapamil. 2D echo with EF ~60-65%. He is tolerating a regular diet. The patient requires inpatient medicine and rehabilitation evaluations and services for ongoing dysfunction secondary to left greater trochanter fracture and small infarct of the R cerebral peduncle.   Medical History:   Past Medical History:  Diagnosis Date   Acute cerebral infarction (HCC) 11/2005   right caudate internal capsule secondary to small vessel   CAD (coronary artery disease)    s/p MI in distant past (1995?); TTE 2010 - EF 55-60%   Chronic kidney  disease    per pt, had kidney disease 55-69 years old   Diabetes mellitus without complication (HCC)    Hyperlipidemia    Hypertension    fluctuates   Lacunar infarction (HCC) 2006/2007   Right basal ganglion, chronic lacunar infarct   Myocardial infarction [facility])    ?1995   SBO (small bowel obstruction) (HCC) 09/2010    Resolved with conservative measures/ per pt has had 2 SBO!   Stenosis of right carotid artery    Stroke (HCC) 2010   TIA (transient ischemic attack) 06/2009         Patient Active Problem List   Diagnosis Date Noted   Adjustment disorder with anxious mood 03/02/2023   Acute arterial ischemic stroke, vertebrobasilar, brainstem, right (HCC) 02/20/2023   Nondisplaced fracture of greater trochanter of left femur, initial encounter for closed fracture (HCC) 02/20/2023   Acute CVA (cerebrovascular accident) (HCC) 02/18/2023   Fracture of hip, closed, left, initial encounter (HCC) 02/16/2023   T2DM (type 2 diabetes mellitus) (HCC) 01/28/2021   Enteritis    Sepsis (HCC) 12/20/2019   Gastritis 12/20/2019   Hyponatremia 12/20/2019   AKI (acute kidney injury) (HCC) 12/20/2019   Essential hypertension 12/20/2019   History of CVA (cerebrovascular accident) 12/20/2019   Hyperglycemia due to type 2 diabetes mellitus (HCC) 12/20/2019   Diabetic peripheral neuropathy associated with type 2 diabetes mellitus (HCC) 11/12/2019   Nonrheumatic tricuspid valve regurgitation 12/12/2018   Small bowel obstruction (HCC) 06/08/2013   Vomiting 06/08/2013   Vertigo, benign positional 01/06/2012    Behavioral Observation/Mental Status:   [name]  presents as a 72 y.o.-year-old Right handed  Male who appeared his stated age. his dress was Appropriate and he was Well Groomed and his manners were Appropriate to the situation.  his participation was indicative of Inattentive behaviors.  There were physical disabilities noted.  he displayed an appropriate level of cooperation and motivation.    Interactions:    Active Appropriate  Attention:   abnormal and attention span appeared shorter than expected for age  Memory:   within normal limits; recent and remote memory intact  Visuo-spatial:   not examined  Speech (Volume):  low  Speech:   normal; normal  Thought Process:  Coherent and Relevant   Coherent and Directed  Though Content:  WNL; not suicidal and not homicidal  Orientation:   person, place, time/date, and situation  Judgment:   Fair  Planning:   Fair  Affect:    Anxious  Mood:    Dysphoric  Insight:   Good  Intelligence:   normal  Family Med/Psych History:  Family History  Problem Relation Age of Onset   Lung cancer Mother    Emphysema Mother    Heart failure Father    Emphysema Sister     Impression/DX:   SANDIP GOTTSCHALL is a 72 year old male referred for neuropsychological consultation during his ongoing inpatient comprehensive rehabilitation admission.  Patient recently presented to the emergency department at [facility] after a fall to ground when his left leg gave way.  Patient reportedly was walking in his home and developed bilateral lower extremity weakness and ultimately fell to the yard.  Patient experienced left hip pain.  CT of left hip with minimally displaced left greater trochanter fracture and determined to be addressed conservatively without surgery.  Patient did have a history of prior stroke and was on aspirin.  Patient had a follow-up MRI showing probable acute ischemic punctate infarct  in the right cerebral peduncle.  Neurology was consulted and placed the patient on Plavix as well as aspirin.  Patient had essentially uncontrolled diabetic status, hypertension.  Patient was admitted to the inpatient comprehensive rehabilitation programs due to dysfunction secondary to stroke.  Patient acknowledged significant stressors outside of dealing with his most recent CVA.  This patient's wife has been diagnosed with colon cancer and is currently in a rehab facility in [place].  Patient reports that he is struggled with coping even before these events.  Patient was oriented but described anxious mood.  Receptive and expressive language functions appear to be intact.  Disposition/Plan:  Today we worked on coping and adjustment  issues dealing with both aspects of his recent CVA and fall with reduced mobility and pain as well as coping with larger psychosocial stressors related to his wife's medical status.  Diagnosis:    Adjustment disorder with anxiety/depressive symptoms.         Electronically Signed   _______________________ Arley Phenix, Psy.D. Clinical Neuropsychologist

## 2023-03-03 LAB — GLUCOSE, CAPILLARY
Glucose-Capillary: 75 mg/dL (ref 70–99)
Glucose-Capillary: 171 mg/dL — ABNORMAL HIGH (ref 70–99)
Glucose-Capillary: 190 mg/dL — ABNORMAL HIGH (ref 70–99)
Glucose-Capillary: 281 mg/dL — ABNORMAL HIGH (ref 70–99)

## 2023-03-03 MED ORDER — INSULIN DETEMIR 100 UNIT/ML ~~LOC~~ SOLN
34.0000 [IU] | Freq: Every day | SUBCUTANEOUS | Status: DC
  Administered 2023-03-03: 34 [IU] via SUBCUTANEOUS
  Filled 2023-03-03 (×2): qty 0.34

## 2023-03-03 NOTE — Progress Notes (Signed)
PROGRESS NOTE   Subjective/Complaints: No new complaints this morning Provided note with admission dates Nursing has no new concerns Discussed increasing Levemir by 1 U  ROS: +insomnia- improved, +lightheadedness, +headache- resolved, no N/V or abdominal pain, denies chest pain or shortness of breath, +left hip pain- continues despite scheduled voltaren gel, improved/reslved   Objective:   No results found. No results for input(s): "WBC", "HGB", "HCT", "PLT" in the last 72 hours.  No results for input(s): "NA", "K", "CL", "CO2", "GLUCOSE", "BUN", "CREATININE", "CALCIUM" in the last 72 hours.   Intake/Output Summary (Last 24 hours) at 03/03/2023 1840 Last data filed at 03/03/2023 1700 Gross per 24 hour  Intake 537 ml  Output 1150 ml  Net -613 ml        Physical Exam: Vital Signs Blood pressure 112/73, pulse 81, temperature 98.3 F (36.8 C), resp. rate 17, height 5\' 4"  (1.626 m), weight 64.6 kg, SpO2 94 %. Gen: no distress, normal appearing, BMI 24.37 HEENT: oral mucosa pink and moist, NCAT, rosacea on face, improved Cardiovascular:     Rate and Rhythm: Normal rate and regular rhythm.     Heart sounds: Normal heart sounds. No murmur heard.    No gallop.  Pulmonary:     Effort: pulmonary effort is normal. No respiratory distress.     Breath sounds: CTAB, no increase WOB Abdominal:     General: Bowel sounds are normal. There is no distension.     Palpations: Abdomen is soft.     Tenderness: There is no abdominal tenderness.     Comments: normoactive  BS Musculoskeletal:     Cervical back: Neck supple. No tenderness.     Comments: RUE 5-/5 in biceps, triceps, WE, grip and FA LUE- 4/5 in same muscles- FA 4-/5 RLE- 5-/5 in HF, KE, DF and PF LLE- HF 1 to 2-/5; DF 3-/5 and PF 3-/5- said too painful to do knee movement  Left hip TTP, appropriately- much improved Skin:    General: Skin is warm and dry.     Comments:  Peeling on face and head with cradle cap/psoriasis? On head Balding Bruise on sternum from fall -did not visualize today Neurological:     Mental Status: Sleeping when I first came in but wakes to voice.  Follows commands     Comments: Decreased to light touch on L side LUE/LLE Slightly delayed responses Ox3 with cues  Ambulating with MinA Psychiatric:     Comments: Flat, appropriate, +anxiety   Assessment/Plan: 1. Functional deficits which require 3+ hours per day of interdisciplinary therapy in a comprehensive inpatient rehab setting. Physiatrist is providing close team supervision and 24 hour management of active medical problems listed below. Physiatrist and rehab team continue to assess barriers to discharge/monitor patient progress toward functional and medical goals  Care Tool:  Bathing    Body parts bathed by patient: Right arm, Left arm, Chest, Abdomen, Front perineal area, Right upper leg, Left upper leg, Face, Buttocks, Left lower leg, Right lower leg   Body parts bathed by helper: Buttocks, Right lower leg, Left lower leg     Bathing assist Assist Level: Minimal Assistance - Patient > 75%  Upper Body Dressing/Undressing Upper body dressing   What is the patient wearing?: Pull over shirt    Upper body assist Assist Level: Set up assist    Lower Body Dressing/Undressing Lower body dressing      What is the patient wearing?: Pants, Incontinence brief     Lower body assist Assist for lower body dressing: Moderate Assistance - Patient 50 - 74%     Toileting Toileting    Toileting assist Assist for toileting: Moderate Assistance - Patient 50 - 74%     Transfers Chair/bed transfer  Transfers assist     Chair/bed transfer assist level: Minimal Assistance - Patient > 75%     Locomotion Ambulation   Ambulation assist      Assist level: Contact Guard/Touching assist Assistive device: Walker-rolling Max distance: 15'   Walk 10 feet  activity   Assist  Walk 10 feet activity did not occur: Safety/medical concerns (fatigue and L hip pain)  Assist level: Contact Guard/Touching assist Assistive device: Walker-rolling   Walk 50 feet activity   Assist Walk 50 feet with 2 turns activity did not occur: Safety/medical concerns  Assist level: Moderate Assistance - Patient - 50 - 74% Assistive device: Walker-Eva    Walk 150 feet activity   Assist Walk 150 feet activity did not occur: Safety/medical concerns         Walk 10 feet on uneven surface  activity   Assist Walk 10 feet on uneven surfaces activity did not occur: Safety/medical concerns         Wheelchair     Assist Is the patient using a wheelchair?: Yes Type of Wheelchair: Manual    Wheelchair assist level: Moderate Assistance - Patient 50 - 74% Max wheelchair distance: 50    Wheelchair 50 feet with 2 turns activity    Assist        Assist Level: Moderate Assistance - Patient 50 - 74%   Wheelchair 150 feet activity     Assist      Assist Level: Dependent - Patient 0%   Blood pressure 112/73, pulse 81, temperature 98.3 F (36.8 C), resp. rate 17, height 5\' 4"  (1.626 m), weight 64.6 kg, SpO2 94 %.    Medical Problem List and Plan: 1. 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  Antithrombotics: -DVT/anticoagulation:  Pharmaceutical: Lovenox             -antiplatelet therapy: Aspirin and Plavix for three weeks followed by Plavix alone   3. Pain Management: Tylenol, oxycodone as needed- pt doesn't want "pain meds' but willing to try Tylenol- pain 8-9/10- but explained cannot do BC powders  -5/4 discussed trying pain meds before therapy help with activity tolerance  -5/5 will increase oxycodone frequency form Q8h to Q6h PRN, continue 2.5mg  dose     4. Mood/Behavior/Sleep: LCSW to evaluate and provide emotional support             -antipsychotic agents: n/a   5. Neuropsych/cognition: This patient is capable of making decisions on his own behalf.   6. Skin/Wound Care: Routine skin care checks   7. Fluids/Electrolytes/Nutrition: Routine Is and Os and follow-up chemistries   8: Hypertension: monitor TID and prn (home: Hyzaar 100/25 daily,  verapamil 240 mg daily). Add magnesium gluconate 250mg  HS  -5/5 one elevated reading overall controlled, continue current regimen     03/03/2023    1:14 PM 03/03/2023    5:15 AM 03/02/2023    7:36 PM  Vitals with BMI  Systolic 112 114 161  Diastolic 73 74 78  Pulse 81 88 84    9: Hyperlipidemia: continue statin   10: Peripheral neuropathy: continue gabapentin 600 mg q HS, discuss Qutenza   11:Left greater trochanter fracture: non-operative             -WBAT with a walker             -follow-up with Dr. Joice Lofts  -kpad ordered   12: DM-2: CBGs QID, carb modified diet; home: (Levemir 50 units BID; Victoza 1.8 mg daily, metformin 1000 mg BID) A1c 13.1%             -continue SSI             -increase Novolog to 6 units with meals             -continue Levemir 30 units in AM, increase Levemir to 26 units at bedtime             -suggest bringing Victoza in from home.   -provided list of foods for diabetics  -placed order for no drinks but water, coffee, and tea  Continue Levemir 33U  CBG (last 3)  Recent Labs    03/03/23 0641 03/03/23 1143 03/03/23 1715  GLUCAP 75 171* 190*     13: Hyponatremia:monitor weekly.   -Recheck Monday   14: Elevated bilirubin: follow-up with PCP outpatient.    15: Vitamin D deficiency: continue weekly supplementation for 8 weeks  16. Lightheadedness: continue oxycodone to 2.5mg  q8H prn. Monitor BP TID. Resolved.   17. Headache: called nursing to bring tylenol for him. Resolved  18. Constipation: chart reviewed and he had large type 4 stool on 5/6,  change senna to HS and increase to 2 tabs, continue fiber supplement, magnesium citrate ordered 5/10  19. Left hip pain: voltaren gel scheduled. XR ordered. Discussed that results are stable, discussed that voltaren gel is helping, continue, much improved.      LOS: 11 days A FACE TO FACE EVALUATION WAS PERFORMED  Drema Pry Cleave Ternes 03/03/2023, 6:40 PM

## 2023-03-04 LAB — GLUCOSE, CAPILLARY
Glucose-Capillary: 123 mg/dL — ABNORMAL HIGH (ref 70–99)
Glucose-Capillary: 151 mg/dL — ABNORMAL HIGH (ref 70–99)
Glucose-Capillary: 127 mg/dL — ABNORMAL HIGH (ref 70–99)
Glucose-Capillary: 174 mg/dL — ABNORMAL HIGH (ref 70–99)

## 2023-03-04 MED ORDER — INSULIN DETEMIR 100 UNIT/ML ~~LOC~~ SOLN
35.0000 [IU] | Freq: Every day | SUBCUTANEOUS | Status: DC
  Administered 2023-03-04 – 2023-03-07 (×4): 35 [IU] via SUBCUTANEOUS
  Filled 2023-03-04 (×5): qty 0.35

## 2023-03-04 MED ORDER — OXYCODONE HCL 5 MG PO TABS
2.5000 mg | ORAL_TABLET | Freq: Three times a day (TID) | ORAL | Status: DC | PRN
  Administered 2023-03-06: 2.5 mg via ORAL
  Filled 2023-03-04: qty 1

## 2023-03-04 NOTE — Progress Notes (Signed)
PROGRESS NOTE   Subjective/Complaints: Patient's chart reviewed- No issues reported overnight Vitals signs stable  Increased Levemir to 35U  ROS: +insomnia- improved, +lightheadedness, +headache- resolved, no N/V or abdominal pain, denies chest pain or shortness of breath, +left hip pain- continues despite scheduled voltaren gel, improved/reslved   Objective:   No results found. No results for input(s): "WBC", "HGB", "HCT", "PLT" in the last 72 hours.  No results for input(s): "NA", "K", "CL", "CO2", "GLUCOSE", "BUN", "CREATININE", "CALCIUM" in the last 72 hours.   Intake/Output Summary (Last 24 hours) at 03/04/2023 0804 Last data filed at 03/04/2023 0745 Gross per 24 hour  Intake 657 ml  Output 750 ml  Net -93 ml        Physical Exam: Vital Signs Blood pressure 122/81, pulse 76, temperature 98.4 F (36.9 C), temperature source Oral, resp. rate 18, height 5\' 4"  (1.626 m), weight 64.6 kg, SpO2 96 %. Gen: no distress, normal appearing, BMI 24.45 HEENT: oral mucosa pink and moist, NCAT, rosacea on face, improved Cardiovascular:     Rate and Rhythm: Normal rate and regular rhythm.     Heart sounds: Normal heart sounds. No murmur heard.    No gallop.  Pulmonary:     Effort: pulmonary effort is normal. No respiratory distress.     Breath sounds: CTAB, no increase WOB Abdominal:     General: Bowel sounds are normal. There is no distension.     Palpations: Abdomen is soft.     Tenderness: There is no abdominal tenderness.     Comments: normoactive  BS Musculoskeletal:     Cervical back: Neck supple. No tenderness.     Comments: RUE 5-/5 in biceps, triceps, WE, grip and FA LUE- 4/5 in same muscles- FA 4-/5 RLE- 5-/5 in HF, KE, DF and PF LLE- HF 1 to 2-/5; DF 3-/5 and PF 3-/5- said too painful to do knee movement  Left hip TTP, appropriately- much improved Skin:    General: Skin is warm and dry.     Comments: Peeling  on face and head with cradle cap/psoriasis? On head Balding Bruise on sternum from fall -did not visualize today Neurological:     Mental Status: Sleeping when I first came in but wakes to voice.  Follows commands, good insight     Comments: Decreased to light touch on L side LUE/LLE Slightly delayed responses Ox3 with cues  Ambulating with MinA Psychiatric:     Comments: Flat, appropriate, +anxiety   Assessment/Plan: 1. Functional deficits which require 3+ hours per day of interdisciplinary therapy in a comprehensive inpatient rehab setting. Physiatrist is providing close team supervision and 24 hour management of active medical problems listed below. Physiatrist and rehab team continue to assess barriers to discharge/monitor patient progress toward functional and medical goals  Care Tool:  Bathing    Body parts bathed by patient: Right arm, Left arm, Chest, Abdomen, Front perineal area, Right upper leg, Left upper leg, Face, Buttocks, Left lower leg, Right lower leg   Body parts bathed by helper: Buttocks, Right lower leg, Left lower leg     Bathing assist Assist Level: Minimal Assistance - Patient > 75%  Upper Body Dressing/Undressing Upper body dressing   What is the patient wearing?: Pull over shirt    Upper body assist Assist Level: Set up assist    Lower Body Dressing/Undressing Lower body dressing      What is the patient wearing?: Pants, Incontinence brief     Lower body assist Assist for lower body dressing: Moderate Assistance - Patient 50 - 74%     Toileting Toileting    Toileting assist Assist for toileting: Moderate Assistance - Patient 50 - 74%     Transfers Chair/bed transfer  Transfers assist     Chair/bed transfer assist level: Minimal Assistance - Patient > 75%     Locomotion Ambulation   Ambulation assist      Assist level: Contact Guard/Touching assist Assistive device: Walker-rolling Max distance: 15'   Walk 10 feet  activity   Assist  Walk 10 feet activity did not occur: Safety/medical concerns (fatigue and L hip pain)  Assist level: Contact Guard/Touching assist Assistive device: Walker-rolling   Walk 50 feet activity   Assist Walk 50 feet with 2 turns activity did not occur: Safety/medical concerns  Assist level: Moderate Assistance - Patient - 50 - 74% Assistive device: Walker-Eva    Walk 150 feet activity   Assist Walk 150 feet activity did not occur: Safety/medical concerns         Walk 10 feet on uneven surface  activity   Assist Walk 10 feet on uneven surfaces activity did not occur: Safety/medical concerns         Wheelchair     Assist Is the patient using a wheelchair?: Yes Type of Wheelchair: Manual    Wheelchair assist level: Moderate Assistance - Patient 50 - 74% Max wheelchair distance: 50    Wheelchair 50 feet with 2 turns activity    Assist        Assist Level: Moderate Assistance - Patient 50 - 74%   Wheelchair 150 feet activity     Assist      Assist Level: Dependent - Patient 0%   Blood pressure 122/81, pulse 76, temperature 98.4 F (36.9 C), temperature source Oral, resp. rate 18, height 5\' 4"  (1.626 m), weight 64.6 kg, SpO2 96 %.    Medical Problem List and Plan: 1. 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  Impaired mobility: continue Lovenox             -antiplatelet therapy: Aspirin and Plavix for three weeks followed by Plavix alone   3. Pain Management: Tylenol, oxycodone as needed- pt doesn't want "pain meds' but willing to try Tylenol- pain 8-9/10- but explained cannot do BC powders  -Decrease oxycodone to 2.5mg  q8H prn   4. Flat affect: neuropsych consulted and note reviewed   5. Neuropsych/cognition: This patient is capable of making  decisions on his own behalf.   6. Skin/Wound Care: Routine skin care checks   7. Fluids/Electrolytes/Nutrition: Routine Is and Os and follow-up chemistries   8: Hypertension: monitor TID and prn (home: Hyzaar 100/25 daily, verapamil 240 mg daily). Add magnesium gluconate 250mg  HS  -5/5 one elevated reading overall controlled, continue current regimen     03/04/2023    6:10 AM 03/03/2023    7:32  PM 03/03/2023    1:14 PM  Vitals with BMI  Systolic 122 119 191  Diastolic 81 84 73  Pulse 76 88 81    9: Hyperlipidemia: continue statin   10: Peripheral neuropathy: continue gabapentin 600 mg q HS, discuss Qutenza   11:Left greater trochanter fracture: non-operative             -WBAT with a walker             -follow-up with Dr. Joice Lofts  -kpad ordered   12: DM-2: CBGs QID, carb modified diet; home: (Levemir 50 units BID; Victoza 1.8 mg daily, metformin 1000 mg BID) A1c 13.1%             -continue SSI             -increase Novolog to 6 units with meals             -increase Levemir to 35U             -suggest bringing Victoza in from home.   -provided list of foods for diabetics  -placed order for no drinks but water, coffee, and tea  CBG (last 3)  Recent Labs    03/03/23 1715 03/03/23 2112 03/04/23 0611  GLUCAP 190* 281* 123*     13: Hyponatremia:monitor weekly.   -Recheck Monday   14: Elevated bilirubin: follow-up with PCP outpatient.    15: Vitamin D deficiency: continue weekly supplementation for 8 weeks  16. Lightheadedness: continue oxycodone to 2.5mg  q8H prn. Monitor BP TID. Resolved.   17. Headache: called nursing to bring tylenol for him. Resolved  18. Constipation: chart reviewed and he had large type 4 stool on 5/6, change senna to HS and increase to 2 tabs, continue fiber supplement, magnesium citrate ordered 5/10  19. Left hip pain: voltaren gel scheduled. XR ordered. Discussed that results are stable, discussed that voltaren gel is helping, continue, much  improved.      LOS: 12 days A FACE TO FACE EVALUATION WAS PERFORMED  Amaree Loisel P Berenice Oehlert 03/04/2023, 8:04 AM

## 2023-03-05 ENCOUNTER — Encounter: Payer: Self-pay | Admitting: Physical Medicine & Rehabilitation

## 2023-03-05 ENCOUNTER — Encounter (HOSPITAL_COMMUNITY): Payer: Self-pay | Admitting: Physical Medicine & Rehabilitation

## 2023-03-05 DIAGNOSIS — S72115D Nondisplaced fracture of greater trochanter of left femur, subsequent encounter for closed fracture with routine healing: Secondary | ICD-10-CM

## 2023-03-05 DIAGNOSIS — E119 Type 2 diabetes mellitus without complications: Secondary | ICD-10-CM

## 2023-03-05 LAB — CBC
HCT: 44.7 % (ref 39.0–52.0)
Hemoglobin: 15 g/dL (ref 13.0–17.0)
MCH: 30.8 pg (ref 26.0–34.0)
MCHC: 33.6 g/dL (ref 30.0–36.0)
MCV: 91.8 fL (ref 80.0–100.0)
Platelets: 427 10*3/uL — ABNORMAL HIGH (ref 150–400)
RBC: 4.87 MIL/uL (ref 4.22–5.81)
RDW: 12.4 % (ref 11.5–15.5)
WBC: 7.5 10*3/uL (ref 4.0–10.5)
nRBC: 0 % (ref 0.0–0.2)

## 2023-03-05 LAB — BASIC METABOLIC PANEL
Anion gap: 8 (ref 5–15)
BUN: 19 mg/dL (ref 8–23)
CO2: 24 mmol/L (ref 22–32)
Calcium: 9.3 mg/dL (ref 8.9–10.3)
Chloride: 102 mmol/L (ref 98–111)
Creatinine, Ser: 0.78 mg/dL (ref 0.61–1.24)
GFR, Estimated: >60 mL/min (ref >60)
Glucose, Bld: 107 mg/dL — ABNORMAL HIGH (ref 70–99)
Potassium: 3.9 mmol/L (ref 3.5–5.1)
Sodium: 134 mmol/L — ABNORMAL LOW (ref 135–145)

## 2023-03-05 LAB — GLUCOSE, CAPILLARY
Glucose-Capillary: 101 mg/dL — ABNORMAL HIGH (ref 70–99)
Glucose-Capillary: 210 mg/dL — ABNORMAL HIGH (ref 70–99)
Glucose-Capillary: 128 mg/dL — ABNORMAL HIGH (ref 70–99)
Glucose-Capillary: 177 mg/dL — ABNORMAL HIGH (ref 70–99)

## 2023-03-05 MED ORDER — SORBITOL 70 % SOLN: 60.0000 mL | Status: AC

## 2023-03-05 MED ORDER — SENNOSIDES-DOCUSATE SODIUM 8.6-50 MG PO TABS
2.0000 | ORAL_TABLET | Freq: Every day | ORAL | Status: DC
  Administered 2023-03-05 – 2023-03-06 (×2): 2 via ORAL
  Filled 2023-03-05 (×2): qty 2

## 2023-03-05 NOTE — Progress Notes (Signed)
Occupational Therapy Session Note  Patient Details  Name: [name] MRN: [id number] Date of Birth: 05/11/1951  Today's Date: 03/05/2023 OT Individual Time: 1350-1446 OT Individual Time Calculation (min): 56 min    Short Term Goals: Week 2:  OT Short Term Goal 1 (Week 2): STG = LTG due to LOS  Skilled Therapeutic Interventions/Progress Updates:     Pt received semi reclined in bed lightly sleeping upon OT arrival presenting to be in good spirits receptive to skilled OT session reporting 0/10 pain- OT offering intermittent rest breaks, repositioning, and therapeutic support to optimize participation in therapy session.   Pt transitioned to EOB with min A to lift trunk +time using bed features. Pt noted to have small amount of BM in brief and was receptive to going to bathroom to get cleaned up. Pt ambulated to bathroom with min A using RW with VB/tactile cues required for RW management, LLE placement/step length, and safety. Pt noted to drag LLE or utilize step-to gait pattern vs step-through pattern requiring min A to facilitate full step-through pattern. Pt transferred to elevated toilet seat with light min A for RW management and balance.   Provided ++time on toilet d/t need for BM (continent BM and void documented in flowsheets). Worked on dynamic standing balance while Pt completed posterior peri-care. Pt able to maintain balance with CGA LUE supported on RW with Pt able to utilize R UE to reach posteriorly for cleaning. Pt able to weave feet into clean brief and pants while seated on toilet CGA for balance. Stood using RW to bring pants to waist CGA. Pt completed functional mobility training back to room CGA using RW with mod tactile cues provided for step height and length.   Pt transported to therapy gym in wc total A for time management and energy conservation. Pt completed stand-step using RW WC>EOM CGA.   Engaged Pt in LB therapeutic exercises to increase strength, balance, and  motor control for increased safety during functional transfers and BADLs. Pt instructed to complete sit<>stands with overhead press while holding  4.4# weighted ball with feet positioned on compliant surface for increased balance challenge. Overhead reaching incorporated into task to simulate reaching during BADLs and IADLs. Pt able to complete weighted sit<>stands with overhead press x6 reps with rest breaks provided between each rep.   Applied duck tape with sticky side out around Pt's L foot during functional mobility activity to increase awareness to LLE and provide increased proprioceptive feedback during ambulation with noted improvement. With duck tape applied to foot, Pt able to complete functional mobility ~100 feet using RW with CGA with step-though pattern no LOB noted.  Pt ambulated back to room using RW CGA with improved step through pattern this trial with duck tape removed. Pt returned to bed CGA.   Pt was left resting in bed with call bell in reach, bed alarm on, and all needs met.   Therapy Documentation Precautions:  Precautions Precautions: Fall Precaution Comments: L hip fx managed non-op, L hemi Restrictions Weight Bearing Restrictions: Yes LLE Weight Bearing: Weight bearing as tolerated LLE Partial Weight Bearing Percentage or Pounds:  (N/A) General:   Vital Signs: Therapy Vitals Temp: 97.7 F (36.5 C) Temp Source: Oral Pulse Rate: 76 Resp: 18 BP: 129/76 Patient Position (if appropriate): Lying Oxygen Therapy SpO2: 99 % O2 Device: Room Air  Therapy/Group: Individual Therapy  [name] 03/05/2023, 8:04 AM

## 2023-03-05 NOTE — Progress Notes (Signed)
Physical Therapy Session Note  Patient Details  Name: [name] MRN: [id number] Date of Birth: June 20, 1951  Today's Date: 03/05/2023 PT Individual Time: 8657-8469 + 1100-1155 PT Individual Time Calculation (min): 70 min  + 55 min  Short Term Goals: Week 2:  PT Short Term Goal 1 (Week 2): STG = LTG  Skilled Therapeutic Interventions/Progress Updates:      1st session: Pt in bed sleeping - awakens to voice and agreeable to therapy. Denies pain at rest but reports 5/10 pain in L hip later in session after ambulating - RN made aware of pain medication request from patient.   Donned disposable pants at bed level with modA. Supine<>Sitting EOB with minA, primarily for trunk support due to L lean and difficulty getting fully upright. Donned shirt with minA and patient again losing his balance to the L while donning shirt so needed minA for UB support. Pt reporting need to void. Sit<>Stand to RW with supervision and ambulates from his bed to his toilet, 63ft, with CGA and RW with cues needed for increasing L step length/height. Pt noted to be incontinent of BM and pt unaware, thought he was just having gas. Pt continent of further BM and bladder. Needed totalA for posterior pericare for cleansing from incontinent episode. New clean brief applied and patient ambulated back to his w/c, 47ft, with CGA and RW with similar cues as above. At wheelchair level, completed hand washing with cues for attending to L arm.   Transported downstairs to [employer] to [employer]. Patient practiced community mobility with RW by carrying an empty cup, filling it with selected soda (RN made aware for CBG, who approved). Patient thankful for his "birthday soda." In [facility] lobby, continued gait training ~14ft + ~44ft with supervision and RW. Antalgic gait with decreased L step length.   Pt returned to CIR floor and ended session seated in w/c. All needs met at end.     2nd session: Pt sitting in w./c to start  and agreeable to therapy session. No reports of pain. Transported to ortho rehab gym for time.   Completed ambulatory car transfer with minA and RW - assist only needed for managing his LLE into the vehicle. Car height replicating standard sedan height.   Pt confirms that a ramp will be built and finished before DC home this week. Patient practicing ambulating up/down a ~59ft ramp with CGA and RW - safety cues to avoid gaining too much speed while descending but otherwise safe.   Stair negotiation completed with 6inch steps and 2 hand rails - he navigated x12 total with supervision with cues for leading with RLE during ascent and LLE during descent. No standing or seated rest break needed while doing the steps.  TUG x3 trials with RW: 62 seconds, 45 seconds, and 41 seconds. AVG = 49.3 seconds. Scores > 13.5 seconds indicate increased falls risk.  Gait training ~168ft with CGA and RW - 1OMWT clocked at 0.25 m/s, indicative of household walker and increased falls risk and being more likely to be hospitalized. Pt wanting to try to ambulate without an AD - ambulated ~8ft with minA and no AD - increased speed and increased stride length but more assist for stability and support due to antalgic gait and weaker LLE.   Returned to his room and assisted back to bed. Alarm on and all needs met at end of treatment.       Therapy Documentation Precautions:  Precautions Precautions: Fall Precaution Comments: L  hip fx managed non-op, L hemi Restrictions Weight Bearing Restrictions: Yes LLE Weight Bearing: Weight bearing as tolerated LLE Partial Weight Bearing Percentage or Pounds:  (N/A) General:     Therapy/Group: Individual Therapy  Cayne Yom P Paeton Latouche  PT, DPT, CSRS  03/05/2023, 7:37 AM

## 2023-03-05 NOTE — Progress Notes (Signed)
PROGRESS NOTE   Subjective/Complaints: Pt up in bed. No new issues. Slept pretty well. No pain.   ROS: Patient denies fever, rash, sore throat, blurred vision, dizziness, nausea, vomiting, diarrhea, cough, shortness of breath or chest pain, joint or back/neck pain, headache, or mood change.    Objective:   No results found. Recent Labs    03/05/23 0657  WBC 7.5  HGB 15.0  HCT 44.7  PLT 427*    Recent Labs    03/05/23 0657  NA 134*  K 3.9  CL 102  CO2 24  GLUCOSE 107*  BUN 19  CREATININE 0.78  CALCIUM 9.3     Intake/Output Summary (Last 24 hours) at 03/05/2023 1055 Last data filed at 03/05/2023 0646 Gross per 24 hour  Intake 236 ml  Output 900 ml  Net -664 ml        Physical Exam: Vital Signs Blood pressure 129/76, pulse 76, temperature 97.7 F (36.5 C), temperature source Oral, resp. rate 18, height 5\' 4"  (1.626 m), weight 64.6 kg, SpO2 99 %. Constitutional: No distress . Vital signs reviewed. HEENT: NCAT, EOMI, oral membranes moist Neck: supple Cardiovascular: RRR without murmur. No JVD    Respiratory/Chest: CTA Bilaterally without wheezes or rales. Normal effort    GI/Abdomen: BS +, non-tender, non-distended Ext: no clubbing, cyanosis, or edema Psych: a little flat but generally pleasant and cooperative  Musculoskeletal:     Cervical back: Neck supple. No tenderness.     Comments: RUE 5-/5 in biceps, triceps, WE, grip and FA LUE- 4/5 in same muscles- FA 4-/5 RLE- 5-/5 in HF, KE, DF and PF LLE- 2-3/5 --still has pain with left hip/knee movement   Skin:    General: Skin is warm and dry.     Comments: Peeling on face and head with cradle cap/psoriasis? On head Balding Bruise on sternum from fall -did not visualize today Neurological:     Mental Status:alert, reasonable insight and awareness     Comments: Decreased to light touch on L side LUE/LLE Slightly delayed responses Ox3 with cues     Assessment/Plan: 1. Functional deficits which require 3+ hours per day of interdisciplinary therapy in a comprehensive inpatient rehab setting. Physiatrist is providing close team supervision and 24 hour management of active medical problems listed below. Physiatrist and rehab team continue to assess barriers to discharge/monitor patient progress toward functional and medical goals  Care Tool:  Bathing    Body parts bathed by patient: Right arm, Left arm, Chest, Abdomen, Front perineal area, Right upper leg, Left upper leg, Face, Buttocks, Left lower leg, Right lower leg   Body parts bathed by helper: Buttocks, Right lower leg, Left lower leg     Bathing assist Assist Level: Minimal Assistance - Patient > 75%     Upper Body Dressing/Undressing Upper body dressing   What is the patient wearing?: Pull over shirt    Upper body assist Assist Level: Set up assist    Lower Body Dressing/Undressing Lower body dressing      What is the patient wearing?: Pants, Incontinence brief     Lower body assist Assist for lower body dressing: Moderate Assistance - Patient 50 -  74%     Toileting Toileting    Toileting assist Assist for toileting: Moderate Assistance - Patient 50 - 74%     Transfers Chair/bed transfer  Transfers assist     Chair/bed transfer assist level: Minimal Assistance - Patient > 75%     Locomotion Ambulation   Ambulation assist      Assist level: Contact Guard/Touching assist Assistive device: Walker-rolling Max distance: 15'   Walk 10 feet activity   Assist  Walk 10 feet activity did not occur: Safety/medical concerns (fatigue and L hip pain)  Assist level: Contact Guard/Touching assist Assistive device: Walker-rolling   Walk 50 feet activity   Assist Walk 50 feet with 2 turns activity did not occur: Safety/medical concerns  Assist level: Moderate Assistance - Patient - 50 - 74% Assistive device: Walker-Eva    Walk 150 feet  activity   Assist Walk 150 feet activity did not occur: Safety/medical concerns         Walk 10 feet on uneven surface  activity   Assist Walk 10 feet on uneven surfaces activity did not occur: Safety/medical concerns         Wheelchair     Assist Is the patient using a wheelchair?: Yes Type of Wheelchair: Manual    Wheelchair assist level: Moderate Assistance - Patient 50 - 74% Max wheelchair distance: 50    Wheelchair 50 feet with 2 turns activity    Assist        Assist Level: Moderate Assistance - Patient 50 - 74%   Wheelchair 150 feet activity     Assist      Assist Level: Dependent - Patient 0%   Blood pressure 129/76, pulse 76, temperature 97.7 F (36.5 C), temperature source Oral, resp. rate 18, height 5\' 4"  (1.626 m), weight 64.6 kg, SpO2 99 %.    Medical Problem List and Plan: 1. 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  Impaired mobility: continue Lovenox             -antiplatelet therapy: Aspirin and Plavix for three weeks followed by Plavix alone   3. Pain Management: Tylenol, oxycodone as needed- pt doesn't want "pain meds' but willing to try Tylenol- pain 8-9/10- but explained cannot do BC powders  -Decreased oxycodone to 2.5mg  q8H prn on 5/12--hasn't used any yet   4. Flat affect: neuropsych consulted and note reviewed   5. Neuropsych/cognition: This patient is capable of making decisions on his own behalf.   6. Skin/Wound Care: Routine skin care checks   7. Fluids/Electrolytes/Nutrition: Routine Is and Os and follow-up chemistries   I personally reviewed the patient's labs today  5/13 8: Hypertension: monitor TID and prn (home: Hyzaar 100/25 daily, verapamil 240 mg daily). Add magnesium gluconate 250mg  HS  -5/13 controlled  on current regimen     03/05/2023    5:46 AM 03/04/2023    7:23 PM 03/04/2023    2:57 PM  Vitals with BMI  Systolic 129 126 130  Diastolic 76 76 71  Pulse 76 88 82    9: Hyperlipidemia: continue statin   10: Peripheral neuropathy: continue gabapentin 600 mg q HS, discuss Qutenza   11:Left greater trochanter fracture:  non-operative             -WBAT with a walker             -follow-up with Dr. Joice Lofts  -kpad ordered   12: DM-2: CBGs QID, carb modified diet; home: (Levemir 50 units BID; Victoza 1.8 mg daily, metformin 1000 mg BID) A1c 13.1%             -continue SSI             -  Novolog   6 units with meals             -increased Levemir to 35U qhs on 5/12             -suggest bringing Victoza in from home.   -provided list of foods for diabetics  -placed order for no drinks but water, coffee, and tea  CBG (last 3)  Recent Labs    03/04/23 1649 03/04/23 2203 03/05/23 0633  GLUCAP 127* 174* 101*   5/13 with change made yesterday-->obsv readings today   13: Hyponatremia:monitor weekly.   -5/13 holding at 134 today   14: Elevated bilirubin: follow-up with PCP outpatient.    15: Vitamin D deficiency: continue weekly supplementation for 8 weeks  16. Lightheadedness: continue oxycodone to 2.5mg  q8H prn. Monitor BP TID. Resolved.   17. Headache: called nursing to bring tylenol for him. Resolved  18. Constipation: chart reviewed and he had large type 4 stool on 5/6, change senna to HS and increase to 2 tabs, continue fiber supplement, magnesium citrate ordered 5/10  5/13 last bm 5/10--will give sorbitol today   -add s to senna, continue miralax 19. Left hip pain: voltaren gel scheduled. XR ordered. Discussed that results are stable, discussed that voltaren gel is helping, continue, much improved.      LOS: 13 days A FACE TO FACE EVALUATION WAS PERFORMED  Ranelle Oyster 03/05/2023, 10:55 AM

## 2023-03-06 LAB — GLUCOSE, CAPILLARY
Glucose-Capillary: 63 mg/dL — ABNORMAL LOW (ref 70–99)
Glucose-Capillary: 179 mg/dL — ABNORMAL HIGH (ref 70–99)
Glucose-Capillary: 124 mg/dL — ABNORMAL HIGH (ref 70–99)
Glucose-Capillary: 125 mg/dL — ABNORMAL HIGH (ref 70–99)
Glucose-Capillary: 182 mg/dL — ABNORMAL HIGH (ref 70–99)

## 2023-03-06 MED ORDER — NAPHAZOLINE-PHENIRAMINE 0.025-0.3 % OP SOLN
1.0000 [drp] | Freq: Four times a day (QID) | OPHTHALMIC | Status: DC | PRN
  Administered 2023-03-07: 1 [drp] via OPHTHALMIC
  Filled 2023-03-06: qty 15

## 2023-03-06 NOTE — Progress Notes (Signed)
Physical Therapy Session Note  Patient Details  Name: [name] MRN: [id number] Date of Birth: 1951-02-08  Today's Date: 03/06/2023 PT Individual Time: 4098-1191 + 1100-1156 PT Individual Time Calculation (min): 56 min  + 56 min  Short Term Goals: Week 2:  PT Short Term Goal 1 (Week 2): STG = LTG  Skilled Therapeutic Interventions/Progress Updates:      1st session: Pt in bed sleeping on arrival - awakens to voice and agreeable to therapy. Patient requesting to shower as his family is coming in for family training today.   Supine<>sitting EOB with supervision with HOB slightly elevated and use of bed rail. Required more than a reasonable amount of time to complete, very difficult for him to activate R trunk to pull himself upright.   Removed dirty shirt without assist. Required CGA for sit<>Stand to RW with difficulty achieving upright, possibly due to general morning stiffness after just being woken. Once standing, patient reporting urge to urinate - ambulated with CGA and RW from EOB to toilet in bathroom - poor safety awareness with urgency and needing cues for slowing down and keeping LLE in walker frame. Patient unable to make toilet in time with urge incontinence resulting in soiled brief - further continent of urine and BM on toilet. Removed dirty brief and pants without assist. Ambulated short distances with CGA and RW into the shower with cues needed for safety approach. Patient showered himself while sitting on chair without assist but needed assist in standing to wash/dry back and buttock while standing and holding grab bar, no physical assist for balance. Deodorant applied to underarms without assist.  At EOB completed UB/LB dressing. - UB with setupA and LB with minA for threading LLE (able to pull them over hips in unsupported standing with close supervision). Able to don hospital socks without assist (!) with ++ time, completed via figure-4 technique.   Administered ABC  questionnaire - pt scored 23% on 5/3 and 45.3 % today. Discussed his results with him and safety concerns for certain activities.   The Activities-specific Balance Confidence (ABC) Scale* Instructions to Participants: For each of the following activities, please indicate your level of confidence in doing the activity without losing your balance or becoming unsteady from choosing one of the percentage points on the scale from 0% to 100% If you do not currently do the activity in question, try and imagine how confident you would be if you had to do the activity. If you normally use a walking aid to do the activity or hold onto someone, rate your confidence as if you were using these supports.    No Confidence 0% 10 20 30  40 50 60 70 80 90 100% Completely Confident   How confident are you that you will not lose your balance or become unsteady when you. 1. .walk around the house? __50___% 2. .walk up or down stairs? ___25__% 3. .bend over and pick up a slipper from the front of a closet floor? __25___% 4. .reach for a small can off a shelf at eye level? __100___% 5. .stand on your tip toes and reach for something above your head? __50___% 6. .stand on a chair and reach for something? ___0__% 7. .sweep the floor? __50___% 8. .walk outside the house to a car parked in the driveway? __50___% 9. .get into or out of a car? ___100__% 10. .walk across a parking lot to the mall? __50___% 11. .walk up or down a ramp? ___100__% 12. .walk in a  crowded mall where people rapidly walk past you? __50___% 13. Marland Kitchenare bumped into by people as you walk through the mall? ___25__% 14. .step onto or off of an escalator while you are holding onto a railing? ___50__% 15. .step onto or off an escalator while holding onto parcels such that you cannot hold onto the railing? ___0__% 16. .walk outside on icy sidewalks? ___0__%  Lowell Guitar LE & Izola Price AM. The Activities-specific Balance Confidence (ABC) Scale. Journal of  Gerontology  Med Sci [phone]; 50(1):M28-34. Total ABC Score: ____725______ Scoring: _________725____ / 16 =  Total ABC Score: _______45.3___% of self confidence   Pt assisted himself to bed with supervision. Alarm on, call bell in reach, and all needs met.     2nd session: Pt in bed to start with his daughter, [name], at the bedside and present during session for family education/training. Dicussed PT goals, PT POC, DME rec's, and follow up therapies.  Bed mobility completed supervision level with hospital bed features. Sit<>stand to RW with supervision and completed stand pivot transfer with supervision and RW to wheelchair. Transported to main rehab gym for time management.  Daughter confirms that ramp is not built yet. Discussed either need for bilateral hand rails or the ramp, neither will be ready by discharge. Trained patient and family on stair navigation by using RW while navigating backwards with assist for stabilizing RW while stepping up/down the 6" steps. Pt completed 1x3 with PT and then 1x3 with the daughter assisting. Discussed benefits of having a 2nd assist for guarding him posteriorly in case of LOB.   Reviewed car transfers with car height simulating daughter's [employer] sedan - patient completed with setupA (RW management) and was able to lift LLE in/out of vehicle without assistance.   Finished session with Nustep for x8 minutes at L2 resistance, targeting AAROM for LLE and LLE attention. 120 steps total.   Returned to his room and assisted back to bed with CGA stand pivot transfer using hand rail as AD. MinA for returning to supine for LLE support. All needs met at conclusion of session.    Therapy Documentation Precautions:  Precautions Precautions: Fall Precaution Comments: L hip fx managed non-op, L hemi Restrictions Weight Bearing Restrictions: Yes LLE Weight Bearing: Weight bearing as tolerated LLE Partial Weight Bearing Percentage or Pounds:  (N/A) General:       Therapy/Group: Individual Therapy  Lyal Husted P Lianni Kanaan  PT, DPT, CSRS  03/06/2023, 7:46 AM

## 2023-03-06 NOTE — Progress Notes (Addendum)
PROGRESS NOTE   Subjective/Complaints: No new complaints this morning Chart reviewed and was hypoglycemic this morning, discussed stopping night time insulin sliding scale  ROS: Patient denies fever, rash, sore throat, blurred vision, dizziness, nausea, vomiting, diarrhea, cough, shortness of breath or chest pain, joint or back/neck pain, headache, or mood change.    Objective:   No results found. Recent Labs    03/05/23 0657  WBC 7.5  HGB 15.0  HCT 44.7  PLT 427*    Recent Labs    03/05/23 0657  NA 134*  K 3.9  CL 102  CO2 24  GLUCOSE 107*  BUN 19  CREATININE 0.78  CALCIUM 9.3     Intake/Output Summary (Last 24 hours) at 03/06/2023 1104 Last data filed at 03/06/2023 0815 Gross per 24 hour  Intake 716 ml  Output 650 ml  Net 66 ml        Physical Exam: Vital Signs Blood pressure 118/75, pulse 86, temperature 97.6 F (36.4 C), resp. rate 19, height 5\' 4"  (1.626 m), weight 64.6 kg, SpO2 94 %. Constitutional: No distress . Vital signs reviewed. BMI 24.45 HEENT: NCAT, EOMI, oral membranes moist Neck: supple Cardiovascular: RRR without murmur. No JVD    Respiratory/Chest: CTA Bilaterally without wheezes or rales. Normal effort    GI/Abdomen: BS +, non-tender, non-distended Ext: no clubbing, cyanosis, or edema Psych: a little flat but generally pleasant and cooperative  Musculoskeletal:     Cervical back: Neck supple. No tenderness.     Comments: RUE 5-/5 in biceps, triceps, WE, grip and FA LUE- 4/5 in same muscles- FA 4-/5 RLE- 5-/5 in HF, KE, DF and PF LLE- 2-3/5 --still has pain with left hip/knee movement   Skin:    General: Skin is warm and dry.     Comments: Peeling on face and head with cradle cap/psoriasis? On head Balding Bruise on sternum from fall -did not visualize today Neurological:     Mental Status:alert, reasonable insight and awareness     Comments: Decreased to light touch on L  side LUE/LLE Slightly delayed responses Ox3 with cues    Assessment/Plan: 1. Functional deficits which require 3+ hours per day of interdisciplinary therapy in a comprehensive inpatient rehab setting. Physiatrist is providing close team supervision and 24 hour management of active medical problems listed below. Physiatrist and rehab team continue to assess barriers to discharge/monitor patient progress toward functional and medical goals  Care Tool:  Bathing    Body parts bathed by patient: Right arm, Left arm, Chest, Abdomen, Front perineal area, Right upper leg, Left upper leg, Face, Buttocks, Left lower leg, Right lower leg   Body parts bathed by helper: Buttocks, Right lower leg, Left lower leg     Bathing assist Assist Level: Minimal Assistance - Patient > 75%     Upper Body Dressing/Undressing Upper body dressing   What is the patient wearing?: Pull over shirt    Upper body assist Assist Level: Set up assist    Lower Body Dressing/Undressing Lower body dressing      What is the patient wearing?: Pants, Incontinence brief     Lower body assist Assist for lower body dressing: Moderate  Assistance - Patient 50 - 74%     Toileting Toileting    Toileting assist Assist for toileting: Moderate Assistance - Patient 50 - 74%     Transfers Chair/bed transfer  Transfers assist     Chair/bed transfer assist level: Minimal Assistance - Patient > 75%     Locomotion Ambulation   Ambulation assist      Assist level: Contact Guard/Touching assist Assistive device: Walker-rolling Max distance: 15'   Walk 10 feet activity   Assist  Walk 10 feet activity did not occur: Safety/medical concerns (fatigue and L hip pain)  Assist level: Contact Guard/Touching assist Assistive device: Walker-rolling   Walk 50 feet activity   Assist Walk 50 feet with 2 turns activity did not occur: Safety/medical concerns  Assist level: Moderate Assistance - Patient - 50 -  74% Assistive device: Walker-Eva    Walk 150 feet activity   Assist Walk 150 feet activity did not occur: Safety/medical concerns         Walk 10 feet on uneven surface  activity   Assist Walk 10 feet on uneven surfaces activity did not occur: Safety/medical concerns         Wheelchair     Assist Is the patient using a wheelchair?: Yes Type of Wheelchair: Manual    Wheelchair assist level: Moderate Assistance - Patient 50 - 74% Max wheelchair distance: 50    Wheelchair 50 feet with 2 turns activity    Assist        Assist Level: Moderate Assistance - Patient 50 - 74%   Wheelchair 150 feet activity     Assist      Assist Level: Dependent - Patient 0%   Blood pressure 118/75, pulse 86, temperature 97.6 F (36.4 C), resp. rate 19, height 5\' 4"  (1.626 m), weight 64.6 kg, SpO2 94 %.    Medical Problem List and Plan: 1. 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  Impaired mobility: continue Lovenox             -antiplatelet therapy: Aspirin and Plavix for three weeks followed by Plavix alone   3. Pain Management: Tylenol, oxycodone as needed- pt doesn't want "pain meds' but willing to try Tylenol- pain 8-9/10- but explained cannot do BC powders  -Decreased oxycodone to 2.5mg  q8H prn on 5/12--hasn't used any yet   4. Flat affect: neuropsych consulted and note reviewed   5. Neuropsych/cognition: This patient is capable of making decisions on his own behalf.   6. Skin/Wound Care: Routine skin care checks   7. Fluids/Electrolytes/Nutrition: Routine Is and Os and follow-up chemistries   I personally reviewed the patient's labs today  5/13 8: Hypertension: monitor TID and prn (home: Hyzaar 100/25 daily, verapamil 240 mg daily). Add magnesium gluconate  250mg  HS  -5/13 controlled on current regimen     03/06/2023    4:38 AM 03/05/2023    8:45 PM 03/05/2023    1:01 PM  Vitals with BMI  Systolic 118 133 409  Diastolic 75 70 72  Pulse 86 74 73    9: Hyperlipidemia: continue statin   10: Peripheral neuropathy: continue gabapentin 600 mg q HS, discuss Qutenza   11:Left greater  trochanter fracture: non-operative             -WBAT with a walker             -follow-up with Dr. Joice Lofts  -kpad ordered   12: DM-2: , carb modified diet; home: (Levemir 50 units BID; Victoza 1.8 mg daily, metformin 1000 mg BID) A1c 13.1%             -continue SSI             -  Novolog   6 units with meals             -increased Levemir to 35U qhs on 5/12             -suggest bringing Victoza in from home.   -provided list of foods for diabetics  -placed order for no drinks but water, coffee, and tea D/c HS ISS  Decrease CBG checks to [facility]  Discussed hypoglycemic episode 5/14 and that because of improved CBG control we can start to wean off ISS since he will not have this at home  CBG (last 3)  Recent Labs    03/05/23 2107 03/06/23 0642 03/06/23 0740  GLUCAP 177* 63* 179*      13: Hyponatremia:monitor weekly.   -5/13 holding at 134 today   14: Elevated bilirubin: follow-up with PCP outpatient.    15: Vitamin D deficiency: continue weekly supplementation for 8 weeks  16. Lightheadedness: continue oxycodone to 2.5mg  q8H prn. Monitor BP TID. Resolved.   17. Headache: called nursing to bring tylenol for him. Resolved  18. Constipation: chart reviewed and he had large type 4 stool on 5/6, change senna to HS and increase to 2 tabs, continue fiber supplement, magnesium citrate ordered 5/10  5/13 last bm 5/10--will give sorbitol today   -add s to senna, continue miralax 19. Left hip pain: voltaren gel scheduled. XR ordered. Discussed that results are stable, discussed that voltaren gel is helping, continue, much improved.   20. Left eye  redness: -allergy drops ordered prn  >50 minutes spent in review of chart, examination of patient, discussion with patient and nursing regarding hypoglycemia, stopping nighttime insulin sliding scale, ordering allergy drops for left eye redness   LOS: 14 days A FACE TO FACE EVALUATION WAS PERFORMED  Acel Natzke P Bennett Ram 03/06/2023, 11:04 AM

## 2023-03-06 NOTE — Progress Notes (Signed)
Occupational Therapy Session Note  Patient Details  Name: [name] MRN: [id number] Date of Birth: October 19, 1951  Today's Date: 03/06/2023 Session 1: OT Individual Time: 1005-1100 OT Individual Time Calculation (min): 55 min  Session 2: OT Individual Time: 1400-1445 OT Individual Time Calculation (min): 45 min   Short Term Goals: Week 2:  OT Short Term Goal 1 (Week 2): STG = LTG due to LOS  Skilled Therapeutic Interventions/Progress Updates:     AM Session: Pt received lightly sleeping in bed waking upon OT arrival presenting to be in good spirits receptive to skilled OT session reporting 0/10 pain- OT offering intermittent rest breaks, repositioning, and therapeutic support to optimize participation in therapy session. [name] arriving shortly after OT arrival for family education focused session. Pt and [name] receptive to all education provided during session. OT educated on CVA recovery process, fall prevention, and energy conservation strategies. Engaged Pt and [name] in conversation following with both able to verbalize simple strategies to increase Pt safety demonstrating teach back as evidence of learning. Educated on DME use such as RW with walker bag for transporting items, BSC, and TTB. Educated [name] on Pt's current deficits and safety consideration when completing BADL routine- shower, grooming/hygiene, toileting, and dressing tasks. Pt able to ambulate to ADL apartment using RW CGA with mod VB/tactile cues provided for LLE step length. Provided demonstration of TTB and BSC transfer with opportunity for practice with Pt's able to complete TTB and BSC transfer with CGA. Provided education on bed mobility and bed rial to help increase Pt's independence with demonstration and education on ways to purchase if desired provided. Pt able to transfer in/out of bed using bed rail on standard queen sized bed with CGA and min VB cueing provided for technique. Discussed RW usage in the  kitchen with opportunity for practice provided. Pt able to complete simulated cooking and cleaning tasks using RW with CGA and min cueing for safety. Pt ambulated back to room at end of session with DTR's assistance CGA and min cues for LLE step length. Pt used restroom standing at toilet with RW close supervision and returned to bed at end of session. Pt and [name] reporting all questions were answered. Pt was left resting in bed with call bell in reach, bed alarm on, and all needs met.   PM Session:  Pt received lightly sleeping in bed presenting to be in good spirits receptive to skilled OT session reporting 0/10 pain- OT offering intermittent rest breaks, repositioning, and therapeutic support to optimize participation in therapy session. Pt L eye noted to be red,- messaged RN to inquire about availability of eye drops for treatment. Pt requesting to ambulate to therapy gym this session >100 feet for functional mobility and endurance training using RW. Pt able to complete CGA with mod VB/tactile cues provided for L foot placement and step length with noted improvement. Educated Pt on UB HEP with handout provided. OT demonstrated each exercise using therband then Pt completed each exercise seated EOM using red therband (moderate resistance):  -1x10 reps Chest Pulls -1x10 reps PNF strengthening diagonal pulls -1x10 reps Resisted external shoulder rotation in neutral-bilateral -1x10 reps PNF inferior pulls Pt requiring mod VB/tactile cueing for technique when completing exercises and increased time for rest breaks between each set.  Applied duck tape around Pt L foot to provided increased challenge when stepping foot, increased proprioceptive feedback, and increased awareness to LLE during functional mobility. Pt ambulated back to room with  CGA- noted improvement with application of duck wit improved step-through gait pattern and increased awareness to L foot placement. Pt returned to bed with CGA using  bed features. Pt was left resting in bed with call bell in reach, bed alarm on, and all needs met.   Therapy Documentation Precautions:  Precautions Precautions: Fall Precaution Comments: L hip fx managed non-op, L hemi Restrictions Weight Bearing Restrictions: Yes LLE Weight Bearing: Weight bearing as tolerated LLE Partial Weight Bearing Percentage or Pounds:  (N/A) General:   Vital Signs: Therapy Vitals Temp: 97.6 F (36.4 C) Pulse Rate: 86 Resp: 19 BP: 118/75 Oxygen Therapy SpO2: 94 % O2 Device: Room Air   Therapy/Group: Individual Therapy  Army Fossa 03/06/2023, 7:47 AM

## 2023-03-06 NOTE — Progress Notes (Signed)
Patient ID: [name], male   DOB: 01-01-1951, 72 y.o.   MRN: [id number]  Met with pt and daughter who is here for education she feels he is doing well. Discussed with both the equipment he needs rolling walker, bedside commode and tub bench his insurance does not cover this due to only has part A instead of part B. He plans to sign up for this when eligible in October during open enrollment. Daughter aware how to obtain and she will try once has money does not currently will give rolling walker in his room to take home with him. Daughter will work on other pieces he needs when has the money. Daughter aware discharge Thursday 5/16. Will work on home health therapies at home. Pt feels ready to go home.

## 2023-03-07 LAB — GLUCOSE, CAPILLARY
Glucose-Capillary: 77 mg/dL (ref 70–99)
Glucose-Capillary: 158 mg/dL — ABNORMAL HIGH (ref 70–99)
Glucose-Capillary: 113 mg/dL — ABNORMAL HIGH (ref 70–99)

## 2023-03-07 MED ORDER — METFORMIN HCL 500 MG PO TABS
500.0000 mg | ORAL_TABLET | Freq: Every day | ORAL | Status: DC
  Administered 2023-03-08: 500 mg via ORAL
  Filled 2023-03-07: qty 1

## 2023-03-07 NOTE — Progress Notes (Signed)
Patient ID: [name], male   DOB: [birth year], 72 y.o.   MRN: [id number] Met with pt to give team conference update regarding reaching goals and readiness for discharge tomorrow. Home health arranged and has rolling walker. Ready for discharge tomorrow.

## 2023-03-07 NOTE — Patient Care Conference (Signed)
Inpatient RehabilitationTeam Conference and Plan of Care Update Date: 03/07/2023   Time: 11:08 AM    Patient Name: [name]      Medical Record Number: [id number]  Date of Birth: 1951-07-31 Sex: Male         Room/Bed: 4M01C/4M01C-01 Payor Info: Payor: MEDICARE / Plan: MEDICARE PART A / Product Type: *No Product type* /    Admit Date/Time:  02/20/2023 12:07 PM  Primary Diagnosis:  Acute arterial ischemic stroke, vertebrobasilar, brainstem, right [facility])  Hospital Problems: Principal Problem:   Acute arterial ischemic stroke, vertebrobasilar, brainstem, right (HCC) Active Problems:   Diabetic peripheral neuropathy associated with type 2 diabetes mellitus (HCC)   Nondisplaced fracture of greater trochanter of left femur, initial encounter for closed fracture (HCC)   Adjustment disorder with anxious mood    Expected Discharge Date: Expected Discharge Date: 03/11/23  Team Members Present: Physician leading conference: Dr. Sula Soda Social Worker Present: Dossie Der, LCSW Nurse Present: Chana Bode, RN PT Present: Wynelle Link, PT OT Present: Roney Mans, OT SLP Present: Other (comment) Fae Pippin, SLP) PPS Coordinator present : Fae Pippin, SLP     Current Status/Progress Goal Weekly Team Focus  Bowel/Bladder   Cont b/b - LBM: 05/14   remain cont B/B   Toilet qshit and PRN    Swallow/Nutrition/ Hydration   CG A min A - finished fam educa yesterday   min A  d/c    ADL's                Mobility   goal level - CGA to min   CGA  DC planning, family ed    Communication                Safety/Cognition/ Behavioral Observations               Pain   Pt states pain 5/10 - Headache - frontal   reduce amounts of headache   assess pain qshift and PRN    Skin   Skin intact   Pt skin remain intact  assess qshift and PRN      Discharge Planning:  Daughter was here yesterday for hands on family educaiton she felt it went well  Have gotten a rolling walker for him daughter to get 3 in1 and tub bench when she has the money due to pt does not have insurance to cover it.  Home health arranged. Daughter aware pt will need 24/7 care. Feels ready for DC tomorrow   Team Discussion: Patient doing well in recovery after post op hip surgey; CVA. Patient on target to meet rehab goals: yes, currently needs CGA - supervision overall for mobility. Goals for discharge set for min assist ADLs; pre-admit level  *See Care Plan and progress notes for long and short-term goals.   Revisions to Treatment Plan:  N/a  Teaching Needs: Safety, medications, skin care, transfers, etc.  Current Barriers to Discharge: Decreased caregiver support and Home enviroment access/layout and lack of insurance for follow up services/equipment coverage  Possible Resolutions to Barriers: Family education 03/06/23 DME TTB and [facility]     Medical Summary Current Status: uncontrolled type 2 diabetes, constipation  Barriers to Discharge: Medical stability  Barriers to Discharge Comments: uncontrolled type 2 diabetes, constipation Possible Resolutions to [employer] Focus: continue Levemir 35U daily, start metformin, d/c sorbitol/senna-docusate/miralax   Continued Need for Acute Rehabilitation Level of Care: The patient requires daily medical management by a physician with specialized training in physical medicine  and rehabilitation for the following reasons: Direction of a multidisciplinary physical rehabilitation program to maximize functional independence : Yes Medical management of patient stability for increased activity during participation in an intensive rehabilitation regime.: Yes Analysis of laboratory values and/or radiology reports with any subsequent need for medication adjustment and/or medical intervention. : Yes   I attest that I was present, lead the team conference, and concur with the assessment and plan of the team.   Chana Bode B 03/07/2023, 4:21 PM

## 2023-03-07 NOTE — Progress Notes (Signed)
PROGRESS NOTE   Subjective/Complaints: No new complaints this morning Ready for d/c tomorrow Discussed current blood sugars and that we will restart metformin  ROS: Patient denies fever, rash, sore throat, blurred vision, dizziness, nausea, vomiting, diarrhea, cough, shortness of breath or chest pain, joint or back/neck pain, headache, constipation or mood change.    Objective:   No results found. Recent Labs    03/05/23 0657  WBC 7.5  HGB 15.0  HCT 44.7  PLT 427*    Recent Labs    03/05/23 0657  NA 134*  K 3.9  CL 102  CO2 24  GLUCOSE 107*  BUN 19  CREATININE 0.78  CALCIUM 9.3     Intake/Output Summary (Last 24 hours) at 03/07/2023 0947 Last data filed at 03/07/2023 0742 Gross per 24 hour  Intake 356 ml  Output 50 ml  Net 306 ml        Physical Exam: Vital Signs Blood pressure 114/62, pulse 73, temperature 97.8 F (36.6 C), resp. rate 18, height 5\' 4"  (1.626 m), weight 64.6 kg, SpO2 96 %. Constitutional: No distress . Vital signs reviewed. BMI 24.45 Gen: no distress, normal appearing HEENT: oral mucosa pink and moist, NCAT Cardio: Reg rate Chest: normal effort, normal rate of breathing Abd: soft, non-distended Ext: no edema Psych: pleasant, normal affect Skin: intact  Psych: a little flat but generally pleasant and cooperative  Musculoskeletal:     Cervical back: Neck supple. No tenderness.     Comments: RUE 5-/5 in biceps, triceps, WE, grip and FA LUE- 4/5 in same muscles- FA 4-/5 RLE- 5-/5 in HF, KE, DF and PF LLE- 2-3/5 --still has pain with left hip/knee movement   Skin:    General: Skin is warm and dry.     Comments: Peeling on face and head with cradle cap/psoriasis? On head Balding Bruise on sternum from fall -did not visualize today Neurological:     Mental Status:alert, reasonable insight and awareness     Comments: Decreased to light touch on L side LUE/LLE Slightly delayed  responses Ox3 with cues    Assessment/Plan: 1. Functional deficits which require 3+ hours per day of interdisciplinary therapy in a comprehensive inpatient rehab setting. Physiatrist is providing close team supervision and 24 hour management of active medical problems listed below. Physiatrist and rehab team continue to assess barriers to discharge/monitor patient progress toward functional and medical goals  Care Tool:  Bathing    Body parts bathed by patient: Right arm, Left arm, Chest, Abdomen, Front perineal area, Right upper leg, Left upper leg, Face, Buttocks, Left lower leg, Right lower leg   Body parts bathed by helper: Buttocks, Right lower leg, Left lower leg     Bathing assist Assist Level: Minimal Assistance - Patient > 75%     Upper Body Dressing/Undressing Upper body dressing   What is the patient wearing?: Pull over shirt    Upper body assist Assist Level: Set up assist    Lower Body Dressing/Undressing Lower body dressing      What is the patient wearing?: Pants, Incontinence brief     Lower body assist Assist for lower body dressing: Moderate Assistance - Patient 50 -  74%     Toileting Toileting    Toileting assist Assist for toileting: Moderate Assistance - Patient 50 - 74%     Transfers Chair/bed transfer  Transfers assist     Chair/bed transfer assist level: Supervision/Verbal cueing     Locomotion Ambulation   Ambulation assist      Assist level: Supervision/Verbal cueing Assistive device: Walker-rolling Max distance: 150'   Walk 10 feet activity   Assist  Walk 10 feet activity did not occur: Safety/medical concerns (fatigue and L hip pain)  Assist level: Supervision/Verbal cueing Assistive device: Walker-rolling   Walk 50 feet activity   Assist Walk 50 feet with 2 turns activity did not occur: Safety/medical concerns  Assist level: Supervision/Verbal cueing Assistive device: Walker-rolling    Walk 150 feet  activity   Assist Walk 150 feet activity did not occur: Safety/medical concerns  Assist level: Supervision/Verbal cueing Assistive device: Walker-rolling    Walk 10 feet on uneven surface  activity   Assist Walk 10 feet on uneven surfaces activity did not occur: Safety/medical concerns   Assist level: Contact Guard/Touching assist Assistive device: Walker-rolling   Wheelchair     Assist Is the patient using a wheelchair?: No Type of Wheelchair: Manual    Wheelchair assist level: Moderate Assistance - Patient 50 - 74% Max wheelchair distance: 50    Wheelchair 50 feet with 2 turns activity    Assist        Assist Level: Moderate Assistance - Patient 50 - 74%   Wheelchair 150 feet activity     Assist      Assist Level: Dependent - Patient 0%   Blood pressure 114/62, pulse 73, temperature 97.8 F (36.6 C), resp. rate 18, height 5\' 4"  (1.626 m), weight 64.6 kg, SpO2 96 %.    Medical Problem List and Plan: 1. 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  Impaired mobility: continue Lovenox             -antiplatelet therapy: Aspirin and Plavix for three weeks followed by Plavix alone   3. Pain Management: Tylenol, oxycodone as needed- pt doesn't want "pain meds' but willing to try Tylenol- pain 8-9/10- but explained cannot do BC powders  -Decreased oxycodone to 2.5mg  q8H prn on 5/12--hasn't used any yet   4. Flat affect: neuropsych consulted and note reviewed   5. Neuropsych/cognition: This patient is capable of making decisions on his own behalf.   6. Skin/Wound Care: Routine skin care checks   7. Fluids/Electrolytes/Nutrition: Routine Is and Os and follow-up chemistries   I personally reviewed the patient's labs today  5/13 8: Hypertension: monitor  TID and prn (home: Hyzaar 100/25 daily, verapamil 240 mg daily). Add magnesium gluconate 250mg  HS  -5/13 controlled on current regimen     03/07/2023    6:04 AM 03/06/2023    8:08 PM 03/06/2023    1:07 PM  Vitals with BMI  Systolic 114 123 161  Diastolic 62 80 75  Pulse 73 80 87    9: Hyperlipidemia: continue statin   10: Peripheral neuropathy: continue gabapentin 600 mg q HS, discuss Qutenza   11:Left greater trochanter fracture: non-operative             -  WBAT with a walker             -follow-up with Dr. Joice Lofts  -kpad ordered   12: DM-2: , carb modified diet; home: (Levemir 50 units BID; Victoza 1.8 mg daily, metformin 1000 mg BID) A1c 13.1%             -continue SSI             -  Novolog   6 units with meals             -increased Levemir to 35U qhs on 5/12             -suggest bringing Victoza in from home.   -provided list of foods for diabetics  -placed order for no drinks but water, coffee, and tea D/c HS ISS  Decrease CBG checks to [facility]  Discussed hypoglycemic episode 5/14 and that because of improved CBG control we can start to wean off ISS since he will not have this at home  Discussed improvement in blood sugars, restarting metformin, discussed metformin's positive effects on the gut microbiome  CBG (last 3)  Recent Labs    03/06/23 1652 03/06/23 2117 03/07/23 0605  GLUCAP 125* 182* 77      13: Hyponatremia:monitor weekly.   -5/13 holding at 134 today   14: Elevated bilirubin: follow-up with PCP outpatient.    15: Vitamin D deficiency: continue weekly supplementation for 8 weeks  16. Lightheadedness: continue oxycodone to 2.5mg  q8H prn. Monitor BP TID. Resolved.   17. Headache: called nursing to bring tylenol for him. Resolved  18. Constipation: chart reviewed and he had large type 4 stool on 5/6, change senna to HS and increase to 2 tabs, continue fiber supplement, magnesium citrate ordered 5/10  5/13 last bm 5/10--will give sorbitol today   -add s to  senna, continue miralax 19. Left hip pain: voltaren gel scheduled. XR ordered. Discussed that results are stable, discussed that voltaren gel is helping, continue, much improved.   20. Left eye redness: -allergy drops ordered prn  >50 minutes spent in review of chart, discussed of improved current blood sugars, restarting metformin, discussed metformin's positive effects on the gut microbiome   LOS: 15 days A FACE TO FACE EVALUATION WAS PERFORMED  Drema Pry Byrd Rushlow 03/07/2023, 9:47 AM

## 2023-03-07 NOTE — Progress Notes (Signed)
Physical Therapy Discharge Summary  Patient Details  Name: [name] MRN: [id number] Date of Birth: 04/13/1951  Date of Discharge from PT service:Mar 07, 2023  Patient has met 8 of 8 long term goals due to improved activity tolerance, improved balance, improved postural control, increased strength, decreased pain, ability to compensate for deficits, functional use of  left upper extremity and left lower extremity, improved attention, and improved awareness.  Patient to discharge at an ambulatory level contact guard to Min Assist.   Patient's care partner is independent to provide the necessary physical and cognitive assistance at discharge.  Reasons goals not met: n/a  Recommendation:  Patient will benefit from ongoing skilled PT services in home health setting to continue to advance safe functional mobility, address ongoing impairments in bed mobility, functional transfers, home safety, gait training, generalized weakness, and minimize fall risk.  Equipment: RW  Reasons for discharge: treatment goals met and discharge from hospital  Patient/family agrees with progress made and goals achieved: Yes  PT Discharge Precautions/Restrictions Precautions Precautions: Fall Precaution Comments: L hip fx managed non-op, L hemi Restrictions Weight Bearing Restrictions: No LLE Weight Bearing: Weight bearing as tolerated Pain Interference Pain Interference Pain Effect on Sleep: 2. Occasionally Pain Interference with Therapy Activities: 4. Almost constantly Pain Interference with Day-to-Day Activities: 4. Almost constantly Vision/Perception  Vision - History Ability to See in Adequate Light: 0 Adequate Perception Perception: Impaired Inattention/Neglect: Does not attend to left side of body (mild) Praxis Praxis: Intact  Cognition Overall Cognitive Status: Within Functional Limits for tasks assessed Arousal/Alertness: Awake/alert Orientation Level: Oriented X4 Attention:  Focused;Sustained;Selective Focused Attention: Appears intact Sustained Attention: Appears intact Selective Attention: Appears intact Memory: Appears intact Awareness: Impaired Awareness Impairment: Anticipatory impairment Problem Solving: Appears intact Decision Making: Appears intact Self Monitoring: Appears intact Safety/Judgment: Appears intact Sensation Sensation Light Touch: Impaired by gross assessment Peripheral sensation comments: Baseline neuropathy in both feet; worse in the left. Hot/Cold: Appears Intact Proprioception: Appears Intact Stereognosis: Appears Intact Coordination Gross Motor Movements are Fluid and Coordinated: No Coordination and Movement Description: L hemi; general debility Heel Shin Test: UTA on L due to pain/weakness Motor  Motor Motor: Hemiplegia;Abnormal postural alignment and control;Other (comment) Motor - Discharge Observations: L hemi, Generalized weakness and deconditioning, pain inhibiting movement patterns 2/2 hip fx (significant improvement since date of evaluation)  Mobility Bed Mobility Bed Mobility: Supine to Sit;Sit to Supine Supine to Sit: Supervision/Verbal cueing Sit to Supine: Supervision/Verbal cueing Transfers Transfers: Sit to Stand;Stand to Sit;Stand Pivot Transfers Sit to Stand: Supervision/Verbal cueing Stand to Sit: Supervision/Verbal cueing Stand Pivot Transfers: Supervision/Verbal cueing Stand Pivot Transfer Details: Verbal cues for safe use of DME/AE;Verbal cues for gait pattern;Verbal cues for precautions/safety Transfer (Assistive device): Rolling walker Locomotion  Gait Ambulation: Yes Gait Assistance: Supervision/Verbal cueing Gait Distance (Feet): 150 Feet Assistive device: Rolling walker Gait Assistance Details: Manual facilitation for weight shifting;Verbal cues for safe use of DME/AE;Verbal cues for gait pattern;Verbal cues for precautions/safety Gait Gait: Yes Gait Pattern: Impaired Gait Pattern:  Step-through pattern;Decreased step length - left;Decreased stance time - left;Decreased hip/knee flexion - left;Decreased weight shift to left;Left flexed knee in stance;Poor foot clearance - left Stairs / Additional Locomotion Stairs: Yes Stairs Assistance: Contact Guard/Touching assist Stair Management Technique: Two rails;Step to pattern;Forwards Number of Stairs: 12 Height of Stairs: 6 Ramp: Contact Guard/touching assist Pick up small object from the floor assist level: Minimal Assistance - Patient > 75% Wheelchair Mobility Wheelchair Mobility: No  Trunk/Postural Assessment  Cervical Assessment Cervical Assessment:  Exceptions to [facility] (forward head) Thoracic Assessment Thoracic Assessment: Exceptions to [facility] (rounded shoulders) Lumbar Assessment Lumbar Assessment: Within Functional Limits Postural Control Postural Control: Deficits on evaluation Trunk Control: trunk lean L in sitting > standing Righting Reactions: Insufficient stepping strategies due to weakness and pain in L hip  Balance Balance Balance Assessed: Yes Standardized Balance Assessment Standardized Balance Assessment: Timed Up and Go Test Timed Up and Go Test TUG: Normal TUG Normal TUG (seconds): 49.3 Static Sitting Balance Static Sitting - Balance Support: Feet supported;No upper extremity supported Static Sitting - Level of Assistance: 5: Stand by assistance Dynamic Sitting Balance Dynamic Sitting - Balance Support: No upper extremity supported;Feet supported Dynamic Sitting - Level of Assistance: 5: Stand by assistance Static Standing Balance Static Standing - Balance Support: No upper extremity supported Static Standing - Level of Assistance: 5: Stand by assistance Dynamic Standing Balance Dynamic Standing - Balance Support: No upper extremity supported;During functional activity Dynamic Standing - Level of Assistance: 4: Min assist Extremity Assessment      RLE Assessment RLE Assessment: Exceptions  to [facility] General Strength Comments: Grossly 4+/5 LLE Assessment LLE Assessment: Exceptions to [facility] LLE Strength LLE Overall Strength: Deficits Left Hip Flexion: 2/5 Left Hip ABduction: 2+/5 Left Hip ADduction: 2+/5 Left Knee Flexion: 2+/5 Left Knee Extension: 3+/5 Left Ankle Dorsiflexion: 3/5   Nicholis Stepanek P Denny Lave  PT, DPT, CSRS  03/07/2023, 7:37 AM

## 2023-03-07 NOTE — Progress Notes (Signed)
Inpatient Rehabilitation Care Coordinator Discharge Note   Patient Details  Name: [name] MRN: [id number] Date of Birth: [birth year]   Discharge location: HOME WITH DAUGHTER AND HER FAMILY SHE CAN PROVIDE 24/7 SUPERVISION  Length of Stay: 16 DAYS  Discharge activity level: SUPERVISION-CGA LEVEL  Home/community participation: ACTIVE  Patient response WU:XLKGMW Literacy - How often do you need to have someone help you when you read instructions, pamphlets, or other written material from your doctor or pharmacy?: Never  Patient response NU:UVOZDG Isolation - How often do you feel lonely or isolated from those around you?: Never  Services provided included: MD, RD, OT, PT, SLP, RN, CM, Pharmacy, Neuropsych, SW  Financial Services:  Financial Services Utilized: Medicare (PART A ONLY)    Choices offered to/list presented to: PT AND DAUGHTER  Follow-up services arranged:  Home Health, Patient/Family has no preference for HH/DME agencies Home Health Agency: [facility]  PT  & OT   ROLLING WALKER GIVEN TO HIM. WILL GET TUB BENCH AND 3 IN 1 ONCE CAN AFFORD IT-AWARE TO LOOK AT [employer]      Patient response to transportation need: Is the patient able to respond to transportation needs?: Yes In the past 12 months, has lack of transportation kept you from medical appointments or from getting medications?: No In the past 12 months, has lack of transportation kept you from meetings, work, or from getting things needed for daily living?: No   Patient/Family verbalized understanding of follow-up arrangements:  Yes  Individual responsible for coordination of the follow-up plan: [name]-DAUGHTER [phone]  Confirmed correct DME delivered: Lucy Chris 03/07/2023    Comments (or additional information):DAUGHTER WAS IN FOR HANDS ON EDUCATION AND SHE FEELS COMFORTABLE WITH HIS CARE AT HOME. PT IS A HIGH FALL RISK AT DISCHARGE DUE TO NEEDS  TO SLOW DOWN AND TAKE HIS TIME WHEN MOVING.    Summary of Stay    Date/Time Discharge Planning CSW  03/07/23 9563 Daughter was here yesterday for hands on family educaiton she felt it went well Have gotten a rolling walker for him daughter to get 3 in1 and tub bench when she has the money due to pt does not have insurance to cover it.  Home health arranged. Daughter aware pt will need 24/7 care. Feels ready for DC tomorrow RGD  02/28/23 0834 HOme with daughter and grandson assisting, daughter also assisting with Mom=pt's wife who is currently in [place] in rehab due to colon cancer. Daughter works 5-9 pm. Aware pt will need someone with him 24/7. Try to schedul family education for early next week. RGD  02/21/23 0918 New evaluation-return home witj daughter who works 5p-9p will be alone during this time RGD       [name], [name]

## 2023-03-07 NOTE — Progress Notes (Signed)
Occupational Therapy Discharge Summary  Patient Details  Name: [name] MRN: [id number] Date of Birth: 1951-01-24  Date of Discharge from OT service:Mar 07, 2023  Today's Date: 03/07/2023 OT Individual Time: 1416-1530 OT Individual Time Calculation (min): 74 min    Patient has met 9 of 9 long term goals due to improved activity tolerance, improved balance, postural control, ability to compensate for deficits, functional use of  LEFT upper and LEFT lower extremity, improved attention, improved awareness, and improved coordination.  Patient to discharge at overall  Supervision-CGA  level.  Patient's care partner is independent to provide the necessary physical and cognitive assistance at discharge.    Reasons goals not met: All goals met  Recommendation:  Patient will benefit from ongoing skilled OT services in home health setting to continue to advance functional skills in the area of BADL, iADL, and Reduce care partner burden.  Equipment: No equipment provided  Reasons for discharge: treatment goals met and discharge from hospital  Patient/family agrees with progress made and goals achieved: Yes  OT Discharge Skilled Therapeutic Interventions/Progress Updates:  Pt received semi reclined in bed presenting to be in good spirits receptive to skilled OT session reporting 0/10 pain- OT offering intermittent rest breaks, repositioning, and therapeutic support to optimize participation in therapy session. Pt requesting to take shower this session. Transferred to EOB supervision. Sit>stand using RW supervision. Pt ambulated to bathroom and stood at toielt to void with close supervision using RW with mod VB cues provided for L foot placement. Pt able to complete U/LB bathing while seated on Ttb with supervision and standing to wash bottom using grab bars for balance +time. Following shower, Pt completed dressing tasks seated EOB completing UB dressing independently and LB dressing with  supervision. Pt able to don socks by crossing legs into figure-4 position with supervision +time. Pt ambulated to sink using RW and completed grooming/hygiene tasks standing at sink with close supervision for increased balance challenge. Pt requiring increased rest breaks and time during BADLS for energy conservation and activity pacing. Reassessed Pt's FM coordination using 9-hole peg test. Pt completed seated in chair at table with practice round provided. Pt average RUE- 34:76 sec LUE- 50:25 sec with noted improvement in LUE hand dexterity compared to initial evaluation. Pt transitioned to supine in bed with supervision. Pt was left resting in bed with call bell in reach, bed alarm on, and all needs met.    Precautions/Restrictions  Precautions Precautions: Fall Precaution Comments: L hip fx managed non-op, L hemi Restrictions Weight Bearing Restrictions: Yes LLE Weight Bearing: Weight bearing as tolerated General   Vital Signs Therapy Vitals Temp: 98.2 F (36.8 C) Temp Source: Oral Pulse Rate: 85 Resp: 18 BP: 133/75 Patient Position (if appropriate): Lying Oxygen Therapy SpO2: 98 % O2 Device: Room Air   ADL ADL Eating: Independent Where Assessed-Eating: Chair Grooming: Supervision/safety Where Assessed-Grooming: Sitting at sink Upper Body Bathing: Supervision/safety Where Assessed-Upper Body Bathing: Shower Lower Body Bathing: Supervision/safety Where Assessed-Lower Body Bathing: Shower Upper Body Dressing: Independent Where Assessed-Upper Body Dressing: Edge of bed Lower Body Dressing: Supervision/safety Where Assessed-Lower Body Dressing: Edge of bed Toileting: Supervision/safety Where Assessed-Toileting: Teacher, adult education: Close supervision Statistician Method: Proofreader: Chiropractor Transfer: Scientific laboratory technician Method: Ship broker: Insurance underwriter: Health visitor Method: Designer, industrial/product: Emergency planning/management officer ADL Comments: difficult time accepting weight on LLE Vision Baseline Vision/History: 1 Wears glasses Patient  Visual Report: No change from baseline Vision Assessment?: No apparent visual deficits Perception  Perception: Impaired Inattention/Neglect: Does not attend to left side of body (improvement from inital evalution) Praxis Praxis: Intact Cognition Cognition Overall Cognitive Status: Within Functional Limits for tasks assessed Arousal/Alertness: Awake/alert Orientation Level: Person;Place;Situation Person: Oriented Place: Oriented Situation: Oriented Memory: Appears intact Attention: Focused;Sustained;Selective Focused Attention: Appears intact Sustained Attention: Appears intact Selective Attention: Appears intact Awareness: Impaired Awareness Impairment: Anticipatory impairment Problem Solving: Appears intact Executive Function: Self Monitoring;Decision Making Decision Making: Appears intact Self Monitoring: Impaired Self Monitoring Impairment: Functional complex;Functional basic Safety/Judgment: Appears intact Brief Interview for Mental Status (BIMS) Repetition of Three Words (First Attempt): 3 Temporal Orientation: Year: Correct Temporal Orientation: Month: Accurate within 5 days Temporal Orientation: Day: Correct Recall: "Sock": Yes, no cue required Recall: "Blue": Yes, no cue required Recall: "Bed": Yes, no cue required BIMS Summary Score: 15 Sensation Sensation Light Touch: Impaired by gross assessment Peripheral sensation comments: Baseline neuropathy in both feet; worse in the left. Hot/Cold: Appears Intact Proprioception: Appears Intact Stereognosis: Appears Intact Coordination Gross Motor Movements are Fluid and Coordinated: No Fine Motor Movements are Fluid and Coordinated: No Coordination and Movement Description: L hemi; general debility Finger Nose  Finger Test: Slowed motor movements on L with overshooting 9 Hole Peg Test: RUE- 34:76 LUE- 50:25 Motor  Motor Motor: Hemiplegia;Abnormal postural alignment and control;Other (comment) Motor - Discharge Observations: L hemi, Generalized weakness and deconditioning, pain inhibiting movement patterns 2/2 hip fx Mobility  Bed Mobility Bed Mobility: Supine to Sit;Sit to Supine Rolling Right: Supervision/verbal cueing Rolling Left: Supervision/Verbal cueing Supine to Sit: Supervision/Verbal cueing Sit to Supine: Supervision/Verbal cueing Transfers Sit to Stand: Supervision/Verbal cueing Stand to Sit: Supervision/Verbal cueing  Trunk/Postural Assessment  Cervical Assessment Cervical Assessment: Exceptions to [facility] (forward head) Thoracic Assessment Thoracic Assessment: Exceptions to [facility] (rounded shoulders) Lumbar Assessment Lumbar Assessment: Within Functional Limits Postural Control Postural Control: Deficits on evaluation Trunk Control: trunk lean L in sitting > standing Righting Reactions: Insufficient stepping strategies due to weakness and pain in L hip  Balance Balance Balance Assessed: Yes Static Standing Balance Static Standing - Balance Support: No upper extremity supported Static Standing - Level of Assistance: 5: Stand by assistance Dynamic Standing Balance Dynamic Standing - Balance Support: No upper extremity supported;During functional activity Dynamic Standing - Level of Assistance: 5: Stand by assistance Extremity/Trunk Assessment RUE Assessment RUE Assessment: Within Functional Limits LUE Assessment LUE Assessment: Exceptions to [facility] Passive Range of Motion (PROM) Comments: WFL Active Range of Motion (AROM) Comments: ~120* shoulder flex with elbow extension General Strength Comments: 4/5   Cornelia Walraven A Clarity Ciszek 03/07/2023, 3:24 PM

## 2023-03-07 NOTE — Progress Notes (Signed)
Physical Therapy Session Note  Patient Details  Name: ESAU WILKIN MRN: [id number] Date of Birth: 1951/07/20  Today's Date: 03/07/2023 PT Individual Time: 1050-1200 PT Individual Time Calculation (min): 70 min   Short Term Goals: Week 2:  PT Short Term Goal 1 (Week 2): STG = LTG  Skilled Therapeutic Interventions/Progress Updates:     Pt received seated in [facility] and agrees to therapy. Reports pain in L hip. Number not provided. PT provides rest breaks as needed to manage pain. Pt performs sit to stand with setup assistance and cues for hand placement and initiation. Pt ambulates x200' slowly with RW and CGA, with cues to increase hip and trunk extension to improve posture, decrease WB through RW for energy conservation, and increasing stride length to improve balance. Pt asked what he would like to emphasize for remainder or session, as this is his last PT session, and pt requests to continue with ambulation to strengthening L lower extremity. Pt ambulates additional bouts of 4x200' with extended seated rest breaks between each bout. For each bout, PT provides CGA, with cues to increase stance time on L for increased loading and strengthening, increased stride length to promote symmetrical reciprocal gait pattern, decreased WB through RW for energy conservation, and increased proximity to RW for safety. Pt left supine in bed with alarm intact and all needs within reach.   Therapy Documentation Precautions:  Precautions Precautions: Fall Precaution Comments: L hip fx managed non-op, L hemi Restrictions Weight Bearing Restrictions: Yes LLE Weight Bearing: Weight bearing as tolerated LLE Partial Weight Bearing Percentage or Pounds:  (N/A)   Therapy/Group: Individual Therapy  Beau Fanny, PT, DPT 03/07/2023, 5:46 PM

## 2023-03-07 NOTE — Plan of Care (Signed)
  Problem: RH Balance Goal: LTG Patient will maintain dynamic standing with ADLs (OT) Description: LTG:  Patient will maintain dynamic standing balance with assist during activities of daily living (OT)  Outcome: Completed/Met   Problem: Sit to Stand Goal: LTG:  Patient will perform sit to stand in prep for activites of daily living with assistance level (OT) Description: LTG:  Patient will perform sit to stand in prep for activites of daily living with assistance level (OT) Outcome: Completed/Met   Problem: RH Bathing Goal: LTG Patient will bathe all body parts with assist levels (OT) Description: LTG: Patient will bathe all body parts with assist levels (OT) Outcome: Completed/Met   Problem: RH Dressing Goal: LTG Patient will perform upper body dressing (OT) Description: LTG Patient will perform upper body dressing with assist, with/without cues (OT). Outcome: Completed/Met Goal: LTG Patient will perform lower body dressing w/assist (OT) Description: LTG: Patient will perform lower body dressing with assist, with/without cues in positioning using equipment (OT) Outcome: Completed/Met   Problem: RH Toileting Goal: LTG Patient will perform toileting task (3/3 steps) with assistance level (OT) Description: LTG: Patient will perform toileting task (3/3 steps) with assistance level (OT)  Outcome: Completed/Met   Problem: RH Functional Use of Upper Extremity Goal: LTG Patient will use RT/LT upper extremity as a (OT) Description: LTG: Patient will use right/left upper extremity as a stabilizer/gross assist/diminished/nondominant/dominant level with assist, with/without cues during functional activity (OT) Outcome: Completed/Met   Problem: RH Toilet Transfers Goal: LTG Patient will perform toilet transfers w/assist (OT) Description: LTG: Patient will perform toilet transfers with assist, with/without cues using equipment (OT) Outcome: Completed/Met   Problem: RH Tub/Shower  Transfers Goal: LTG Patient will perform tub/shower transfers w/assist (OT) Description: LTG: Patient will perform tub/shower transfers with assist, with/without cues using equipment (OT) Outcome: Completed/Met   

## 2023-03-07 NOTE — Progress Notes (Signed)
Physical Therapy Session Note  Patient Details  Name: [name] MRN: [id number] Date of Birth: July 10, 1951  Today's Date: 03/07/2023 PT Individual Time: 0900-0954 PT Individual Time Calculation (min): 54 min   Short Term Goals: Week 2:  PT Short Term Goal 1 (Week 2): STG = LTG  Skilled Therapeutic Interventions/Progress Updates:      Pt in bed to start - awake and agreeable to therapy. Reports feeling anxious regarding DC home tomorrow, reports he "doesn't want to fall and come back."  Noted strong smell of urine - pt incontinent of brief and seemingly unaware. Once brief removed, patient reported urge to void so he ambulated to the bathroom with CGA and RW with max cues for adequate step length on L. Pt continent of urine void while standing and holding to grab bars. Donned clean brief in standing.  Transported to rehab gym in w/c for time.  Ambulated ~98ft with supervision and RW to // bars - continues to demonstrate antalgic gait pattern, poor foot clearance on L, absent heel strike on L, and L trunk lean. Cues throughout for normalizing gait pattern.  Standing there-ex in // bars: -1x10 standing squats -1x10 standing marching -1x10 standing hip abd/add -1x10 heel raises  HEP printout provided.  Access Code: [email or web address] URL: [email or web address] Date: 03/07/2023 Prepared by: Wynelle Link  Exercises - Sit to Stand with Counter Support  - 1 x daily - 7 x weekly - 3 sets - 10 reps - Standing March with Counter Support  - 1 x daily - 7 x weekly - 3 sets - 10 reps - Standing Hip Abduction with Counter Support  - 1 x daily - 7 x weekly - 3 sets - 10 reps - Heel Raises with Counter Support  - 1 x daily - 7 x weekly - 3 sets - 10 reps - Mini Squat with Counter Support  - 1 x daily - 7 x weekly - 3 sets - 10 reps - Side Stepping with Counter Support  - 1 x daily - 7 x weekly - 3 sets - 10 reps - Wide Stance with Counter Support  - 1 x daily - 7 x weekly - 3  sets - 10 reps - Narrow Stance with Counter Support  - 1 x daily - 7 x weekly - 3 sets - 10 reps   Pt returned to his room - agreeable to stay sitting in wheelchair until next therapy session. All needs met with call bell in lap.    Therapy Documentation Precautions:  Precautions Precautions: Fall Precaution Comments: L hip fx managed non-op, L hemi Restrictions Weight Bearing Restrictions: Yes LLE Weight Bearing: Weight bearing as tolerated LLE Partial Weight Bearing Percentage or Pounds:  (N/A) General:      Therapy/Group: Individual Therapy  Breindel Collier P Kalysta Kneisley  PT, DPT, CSRS  03/07/2023, 7:30 AM

## 2023-03-08 ENCOUNTER — Other Ambulatory Visit (HOSPITAL_COMMUNITY): Payer: Self-pay

## 2023-03-08 LAB — GLUCOSE, CAPILLARY
Glucose-Capillary: 63 mg/dL — ABNORMAL LOW (ref 70–99)
Glucose-Capillary: 200 mg/dL — ABNORMAL HIGH (ref 70–99)

## 2023-03-08 MED ORDER — ACETAMINOPHEN 325 MG PO TABS: 325.0000 mg | ORAL_TABLET | ORAL | Status: DC | PRN

## 2023-03-08 MED ORDER — CLOPIDOGREL BISULFATE 75 MG PO TABS
75.0000 mg | ORAL_TABLET | Freq: Every day | ORAL | 0 refills | Status: DC
  Filled 2023-03-08: qty 30, 30d supply, fill #0

## 2023-03-08 MED ORDER — LEVEMIR FLEXPEN 100 UNIT/ML ~~LOC~~ SOPN: PEN_INJECTOR | SUBCUTANEOUS | 0 refills | Status: DC

## 2023-03-08 MED ORDER — MAGNESIUM OXIDE 400 MG PO TABS
200.0000 mg | ORAL_TABLET | Freq: Every day | ORAL | 0 refills | Status: DC
  Filled 2023-03-08: qty 15, 30d supply, fill #0

## 2023-03-08 MED ORDER — METFORMIN HCL 1000 MG PO TABS: 500.0000 mg | ORAL_TABLET | Freq: Every morning | ORAL | Status: DC

## 2023-03-08 MED ORDER — GABAPENTIN 600 MG PO TABS
600.0000 mg | ORAL_TABLET | Freq: Every evening | ORAL | 0 refills | Status: DC
  Filled 2023-03-08: qty 30, 30d supply, fill #0

## 2023-03-08 MED ORDER — CALCIUM CARBONATE ANTACID 500 MG PO CHEW: 1.0000 | CHEWABLE_TABLET | Freq: Every day | ORAL | Status: DC

## 2023-03-08 MED ORDER — OXYCODONE HCL 5 MG PO TABS: 2.5000 mg | ORAL_TABLET | Freq: Two times a day (BID) | ORAL | Status: DC | PRN

## 2023-03-08 MED ORDER — VITAMIN D (ERGOCALCIFEROL) 1.25 MG (50000 UNIT) PO CAPS
50000.0000 [IU] | ORAL_CAPSULE | ORAL | 0 refills | Status: DC
  Filled 2023-03-08: qty 5, 35d supply, fill #0

## 2023-03-08 MED ORDER — ASPIRIN 81 MG PO TBEC
81.0000 mg | DELAYED_RELEASE_TABLET | Freq: Every day | ORAL | 0 refills | Status: DC
  Filled 2023-03-08: qty 19, 19d supply, fill #0

## 2023-03-08 MED ORDER — ADULT MULTIVITAMIN W/MINERALS CH: 1.0000 | ORAL_TABLET | Freq: Every day | ORAL | Status: DC

## 2023-03-08 NOTE — Progress Notes (Signed)
PROGRESS NOTE   Subjective/Complaints: No new complaints this morning  ROS: Patient denies fever, rash, sore throat, blurred vision, dizziness, nausea, vomiting, diarrhea, cough, shortness of breath or chest pain, joint or back/neck pain, headache, constipation or mood change.    Objective:   No results found. No results for input(s): "WBC", "HGB", "HCT", "PLT" in the last 72 hours.   No results for input(s): "NA", "K", "CL", "CO2", "GLUCOSE", "BUN", "CREATININE", "CALCIUM" in the last 72 hours.    Intake/Output Summary (Last 24 hours) at 03/08/2023 0937 Last data filed at 03/08/2023 1610 Gross per 24 hour  Intake 600 ml  Output 525 ml  Net 75 ml        Physical Exam: Vital Signs Blood pressure 104/74, pulse 93, temperature 98.1 F (36.7 C), resp. rate 19, height 5\' 4"  (1.626 m), weight 64.6 kg, SpO2 96 %. Constitutional: No distress . Vital signs reviewed. BMI 24.45 Gen: no distress, normal appearing HEENT: oral mucosa pink and moist, NCAT Cardio: Reg rate Chest: normal effort, normal rate of breathing Abd: soft, non-distended Ext: no edema  Skin: intact Psych: a little flat but generally pleasant and cooperative  Musculoskeletal:     Cervical back: Neck supple. No tenderness.     Comments: RUE 5-/5 in biceps, triceps, WE, grip and FA LUE- 4/5 in same muscles- FA 4-/5 RLE- 5-/5 in HF, KE, DF and PF LLE- 2-3/5 --still has pain with left hip/knee movement   Skin:    General: Skin is warm and dry.     Comments: Peeling on face and head with cradle cap/psoriasis? On head Balding Bruise on sternum from fall -did not visualize today Neurological:     Mental Status:alert, reasonable insight and awareness     Comments: Decreased to light touch on L side LUE/LLE Slightly delayed responses Ox3 with cues    Assessment/Plan: 1. Functional deficits which require 3+ hours per day of interdisciplinary therapy in a  comprehensive inpatient rehab setting. Physiatrist is providing close team supervision and 24 hour management of active medical problems listed below. Physiatrist and rehab team continue to assess barriers to discharge/monitor patient progress toward functional and medical goals  Care Tool:  Bathing    Body parts bathed by patient: Right arm, Left arm, Chest, Abdomen, Front perineal area, Right upper leg, Left upper leg, Face, Buttocks, Left lower leg, Right lower leg   Body parts bathed by helper: Buttocks, Right lower leg, Left lower leg     Bathing assist Assist Level: Supervision/Verbal cueing     Upper Body Dressing/Undressing Upper body dressing   What is the patient wearing?: Pull over shirt    Upper body assist Assist Level: Independent    Lower Body Dressing/Undressing Lower body dressing      What is the patient wearing?: Pants, Incontinence brief     Lower body assist Assist for lower body dressing: Supervision/Verbal cueing     Toileting Toileting    Toileting assist Assist for toileting: Supervision/Verbal cueing     Transfers Chair/bed transfer  Transfers assist     Chair/bed transfer assist level: Supervision/Verbal cueing     Locomotion Ambulation   Ambulation assist  Assist level: Supervision/Verbal cueing Assistive device: Walker-rolling Max distance: 150'   Walk 10 feet activity   Assist  Walk 10 feet activity did not occur: Safety/medical concerns (fatigue and L hip pain)  Assist level: Supervision/Verbal cueing Assistive device: Walker-rolling   Walk 50 feet activity   Assist Walk 50 feet with 2 turns activity did not occur: Safety/medical concerns  Assist level: Supervision/Verbal cueing Assistive device: Walker-rolling    Walk 150 feet activity   Assist Walk 150 feet activity did not occur: Safety/medical concerns  Assist level: Supervision/Verbal cueing Assistive device: Walker-rolling    Walk 10 feet on  uneven surface  activity   Assist Walk 10 feet on uneven surfaces activity did not occur: Safety/medical concerns   Assist level: Contact Guard/Touching assist Assistive device: Walker-rolling   Wheelchair     Assist Is the patient using a wheelchair?: No Type of Wheelchair: Manual    Wheelchair assist level: Moderate Assistance - Patient 50 - 74% Max wheelchair distance: 50    Wheelchair 50 feet with 2 turns activity    Assist        Assist Level: Moderate Assistance - Patient 50 - 74%   Wheelchair 150 feet activity     Assist      Assist Level: Dependent - Patient 0%   Blood pressure 104/74, pulse 93, temperature 98.1 F (36.7 C), resp. rate 19, height 5\' 4"  (1.626 m), weight 64.6 kg, SpO2 96 %.    Medical Problem List and Plan: 1. 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  Impaired mobility: d/c lovenox upon d/c             -antiplatelet therapy: Aspirin and Plavix for three weeks followed by Plavix alone   3. Pain Management: Tylenol, oxycodone as needed- pt doesn't want "pain meds' but willing to try Tylenol- pain 8-9/10- but explained cannot do BC powders  -Decrease oxycodone to 2.5mg  q12H prn    4. Flat affect: neuropsych consulted and note reviewed   5. Neuropsych/cognition: This patient is capable of making decisions on his own behalf.   6. Skin/Wound Care: Routine skin care checks   7. Fluids/Electrolytes/Nutrition: Routine Is and Os and follow-up chemistries   I personally reviewed the patient's labs today  5/13 8: Hypertension: monitor TID and prn (home: Hyzaar 100/25 daily, verapamil 240 mg daily). Add magnesium gluconate 250mg  HS  -5/13 controlled on current regimen     03/08/2023    6:54 AM 03/07/2023    9:27 PM 03/07/2023   12:59 PM  Vitals with BMI  Systolic 104 142 409   Diastolic 74 86 75  Pulse 93 80 85    9: Hyperlipidemia: continue statin   10: Peripheral neuropathy: continue gabapentin 600 mg q HS, discuss Qutenza   11:Left greater trochanter fracture: non-operative             -WBAT with a walker             -follow-up with Dr. Joice Lofts  -kpad ordered   12: DM-2: , carb modified diet; home: (Levemir 50 units BID; Victoza 1.8 mg daily, metformin 1000 mg BID) A1c 13.1%             -continue SSI             -  Novolog   6 units with meals             -increased Levemir to 35U qhs on 5/12             -suggest bringing Victoza in from home.   -provided list of foods for diabetics  -placed order for no drinks but water, coffee, and tea D/c HS ISS  Decrease CBG checks to [facility]  Discussed hypoglycemic episode 5/14 and that because of improved CBG control we can start to wean off ISS since he will not have this at home  Discussed improvement in blood sugars, restarting metformin, discussed metformin's positive effects on the gut microbiome  CBG (last 3)  Recent Labs    03/07/23 1634 03/08/23 0652 03/08/23 0844  GLUCAP 113* 63* 200*      13: Hyponatremia:monitor weekly.   -5/13 holding at 134 today   14: Elevated bilirubin: follow-up with PCP outpatient.    15: Vitamin D deficiency: continue weekly supplementation for 8 weeks  16. Lightheadedness: continue oxycodone to 2.5mg  q8H prn. Monitor BP TID. Resolved.   17. Headache: called nursing to bring tylenol for him. Resolved  18. Constipation: chart reviewed and he had large type 4 stool on 5/6, change senna to HS and increase to 2 tabs, continue fiber supplement, magnesium citrate ordered 5/10  5/13 last bm 5/10--will give sorbitol today   -add s to senna, continue miralax 19. Left hip pain: voltaren gel scheduled. XR ordered. Discussed that results are stable, discussed that voltaren gel is helping, continue, much improved.   20. Left eye redness: -allergy drops ordered prn   >30  minutes spent in discharge of patient including review of medications and follow-up appointments, physical examination, and in answering all patient's questions   LOS: 16 days A FACE TO FACE EVALUATION WAS PERFORMED  Drema Pry Marisela Line 03/08/2023, 9:37 AM

## 2023-03-08 NOTE — Progress Notes (Signed)
Hypoglycemic Event  CBG: 63  Treatment: 4 oz juice/soda  Symptoms: None  Follow-up CBG: Time:715  CBG Result:TBD  Possible Reasons for Event: Unknown  Comments/MD notified:Dan PA notified    Melvenia Beam

## 2023-03-08 NOTE — Progress Notes (Signed)
Inpatient Rehabilitation Discharge Medication Review by a Pharmacist  A complete drug regimen review was completed for this patient to identify any potential clinically significant medication issues.  High Risk Drug Classes Is patient taking? Indication by Medication  Antipsychotic No   Anticoagulant No   Antibiotic No   Opioid No   Antiplatelet Yes Asa, plavix- cva ppx (DAPT x 3 weeks f/b plavix alone. ASA DC date: 03/27/2023)  Hypoglycemics/insulin Yes Levemir, metformin- T2DM  Vasoactive Medication No   Chemotherapy No   Other Yes Lipitor- HLD Gabapentin- neuropathic pain Ergocalciferol- vit D deficiency     Type of Medication Issue Identified Description of Issue Recommendation(s)  Drug Interaction(s) (clinically significant)     Duplicate Therapy     Allergy     No Medication Administration End Date     Incorrect Dose     Additional Drug Therapy Needed     Significant med changes from prior encounter (inform family/care partners about these prior to discharge).    Other       Clinically significant medication issues were identified that warrant physician communication and completion of prescribed/recommended actions by midnight of the next day:  No   Time spent performing this drug regimen review (minutes):  30   [name] BS, PharmD, BCPS Clinical Pharmacist 03/08/2023 9:30 AM  Contact: [phone] after 3 PM  "Be curious, not judgmental..." -Debbora Dus

## 2023-03-08 NOTE — Progress Notes (Signed)
Patient discharged with family member, no questions or concerns at this time.  

## 2023-04-23 DEATH — deceased

## 2023-05-07 ENCOUNTER — Inpatient Hospital Stay: Payer: Self-pay | Admitting: Physical Medicine and Rehabilitation
